# Patient Record
Sex: Female | Born: 1965 | Race: White | Hispanic: No | Marital: Married | Smoking: Never smoker
Health system: Southern US, Community
[De-identification: ages and names within clinical notes are randomized; demographics above are authoritative.]

## PROBLEM LIST (undated history)

## (undated) DIAGNOSIS — Z800 Family history of malignant neoplasm of digestive organs: Secondary | ICD-10-CM

## (undated) DIAGNOSIS — E119 Type 2 diabetes mellitus without complications: Secondary | ICD-10-CM

## (undated) DIAGNOSIS — Z803 Family history of malignant neoplasm of breast: Secondary | ICD-10-CM

## (undated) DIAGNOSIS — Z8042 Family history of malignant neoplasm of prostate: Secondary | ICD-10-CM

## (undated) DIAGNOSIS — C50919 Malignant neoplasm of unspecified site of unspecified female breast: Secondary | ICD-10-CM

## (undated) DIAGNOSIS — Z923 Personal history of irradiation: Secondary | ICD-10-CM

## (undated) HISTORY — DX: Family history of malignant neoplasm of prostate: Z80.42

## (undated) HISTORY — DX: Family history of malignant neoplasm of breast: Z80.3

## (undated) HISTORY — PX: CHOLECYSTECTOMY: SHX55

## (undated) HISTORY — PX: BREAST LUMPECTOMY: SHX2

## (undated) HISTORY — DX: Family history of malignant neoplasm of digestive organs: Z80.0

## (undated) HISTORY — PX: COLONOSCOPY: SHX174

---

## 1999-02-10 ENCOUNTER — Other Ambulatory Visit: Admission: RE | Admit: 1999-02-10 | Discharge: 1999-02-10 | Payer: Self-pay | Admitting: Obstetrics & Gynecology

## 2000-02-15 ENCOUNTER — Other Ambulatory Visit: Admission: RE | Admit: 2000-02-15 | Discharge: 2000-02-15 | Payer: Self-pay | Admitting: Obstetrics & Gynecology

## 2001-02-28 ENCOUNTER — Other Ambulatory Visit: Admission: RE | Admit: 2001-02-28 | Discharge: 2001-02-28 | Payer: Self-pay | Admitting: Obstetrics & Gynecology

## 2002-03-10 ENCOUNTER — Other Ambulatory Visit: Admission: RE | Admit: 2002-03-10 | Discharge: 2002-03-10 | Payer: Self-pay | Admitting: Obstetrics & Gynecology

## 2003-03-17 ENCOUNTER — Other Ambulatory Visit: Admission: RE | Admit: 2003-03-17 | Discharge: 2003-03-17 | Payer: Self-pay | Admitting: Obstetrics & Gynecology

## 2004-04-13 ENCOUNTER — Other Ambulatory Visit: Admission: RE | Admit: 2004-04-13 | Discharge: 2004-04-13 | Payer: Self-pay | Admitting: Obstetrics & Gynecology

## 2005-04-17 ENCOUNTER — Other Ambulatory Visit: Admission: RE | Admit: 2005-04-17 | Discharge: 2005-04-17 | Payer: Self-pay | Admitting: Obstetrics & Gynecology

## 2013-05-12 ENCOUNTER — Other Ambulatory Visit: Payer: Self-pay | Admitting: Otolaryngology

## 2013-05-12 DIAGNOSIS — M542 Cervicalgia: Secondary | ICD-10-CM

## 2013-05-14 ENCOUNTER — Ambulatory Visit
Admission: RE | Admit: 2013-05-14 | Discharge: 2013-05-14 | Disposition: A | Discharge status: Home / Self Care | Payer: 59 | Source: Ambulatory Visit | Attending: Otolaryngology | Admitting: Otolaryngology

## 2013-05-14 DIAGNOSIS — M542 Cervicalgia: Secondary | ICD-10-CM

## 2013-05-14 IMAGING — CT CT NECK W/ CM
4 of 5 series · 15 of 33 positions shown, 18 images · IV contrast (75CC OMNI 300)
Comparison: None.

CLINICAL DATA: 47-year-old female with left posterior neck pain x2
months. Area of clinical concern was marked, but there is no
associated palpable abnormality. Initial encounter.

EXAM:
CT NECK WITH CONTRAST
TECHNIQUE: Multidetector CT imaging of the neck was performed using the
standard protocol following the bolus administration of intravenous
contrast.
CONTRAST:  75mL OMNIPAQUE IOHEXOL 300 MG/ML  SOLN

[Series 2: axial neck · axial · 0.40mm/px · z∈[-256,-201]mm · 2 of 110 slices shown]
[im 22/110  bone]
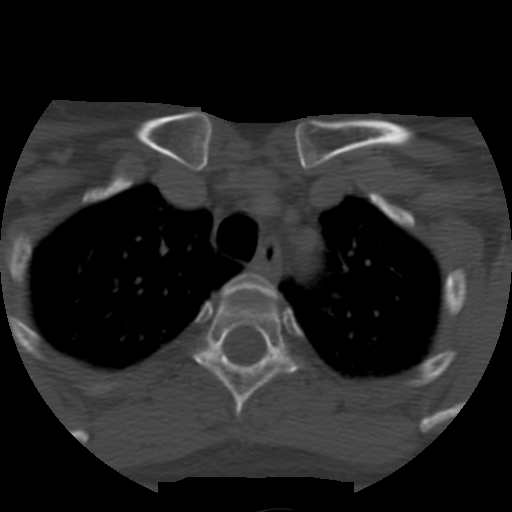
[im 44/110  bone]
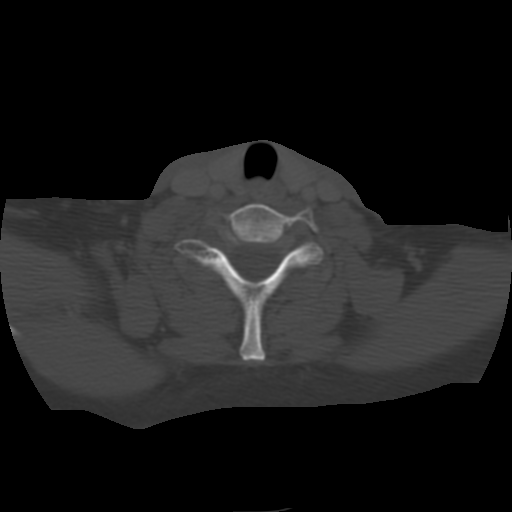

[Series 400: cor · coronal · 0.55mm/px · 3 of 96 slices shown]
[im 20/96  bone]
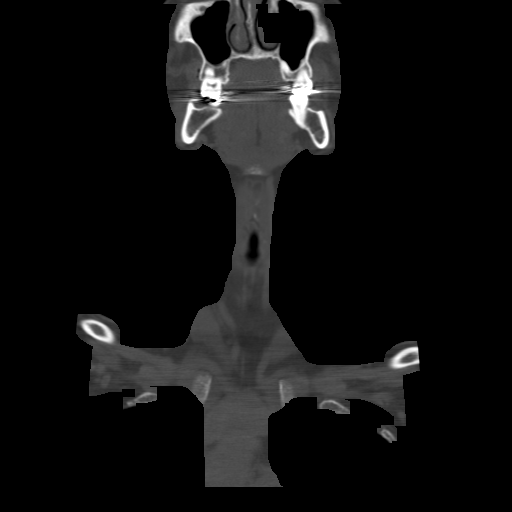
[im 39/96  bone]
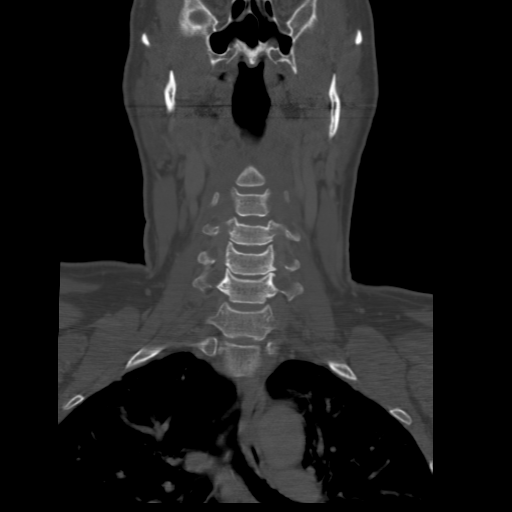
[im 58/96  bone]
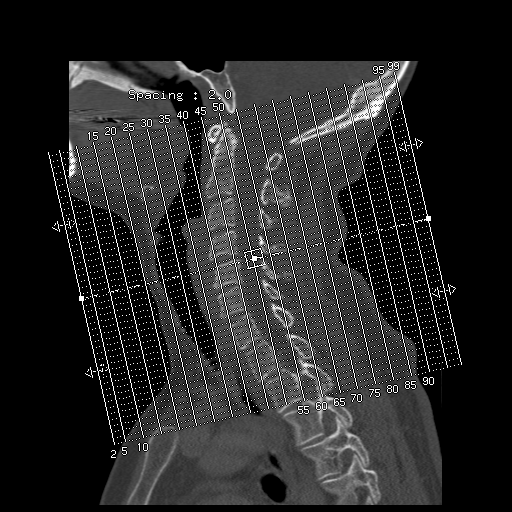

[Series 401: sag · sagittal · 0.55mm/px · 5 of 99 slices shown, 6 images]
[im 33/99  bone]
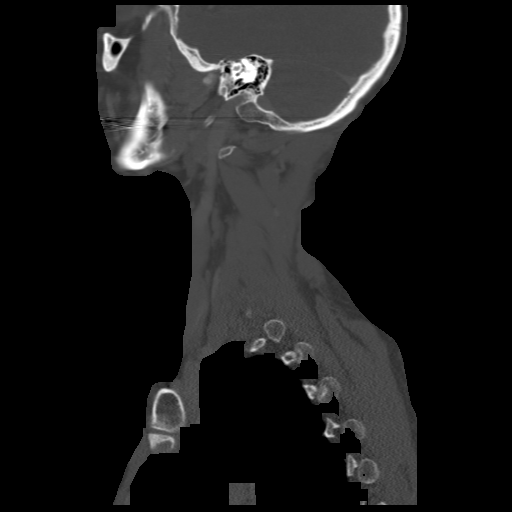
[im 41/99  bone]
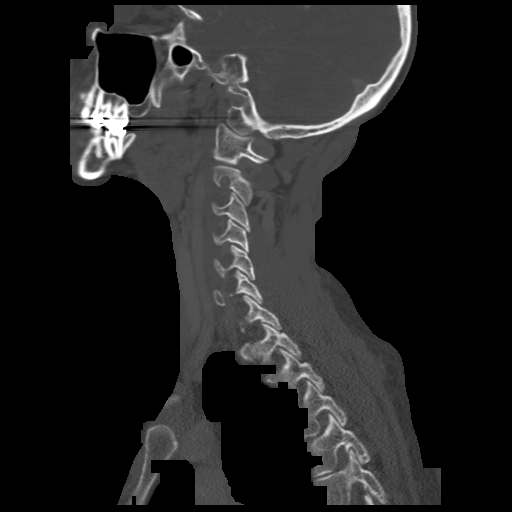
[im 50/99  soft-tissue]
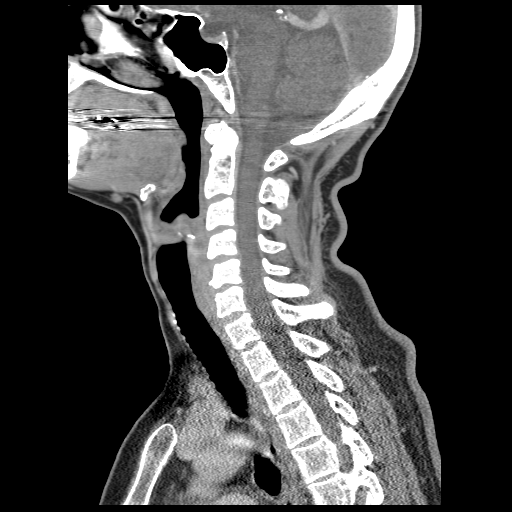
[im 50/99  bone]
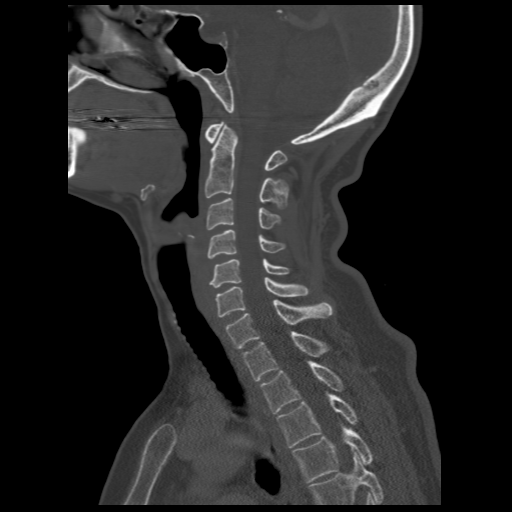
[im 58/99  bone]
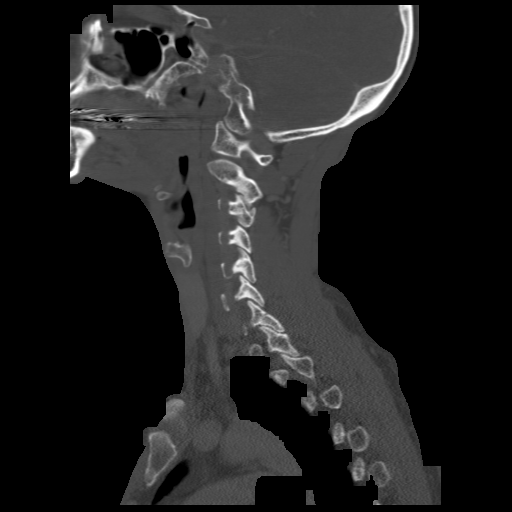
[im 66/99  bone]
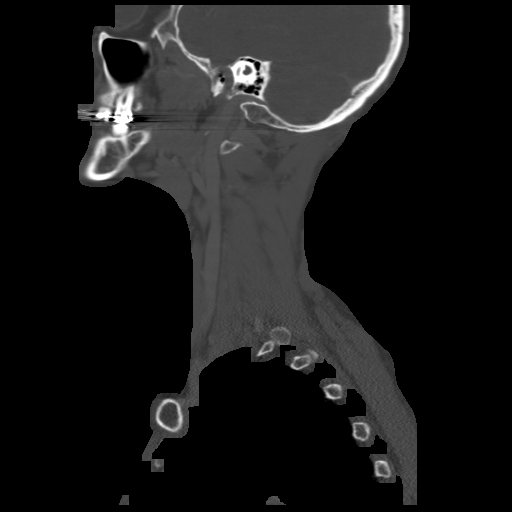

[Series 402: axial · axial · 0.43mm/px · z∈[-288,-108]mm · 5 of 140 slices shown, 7 images]
[im 24/140  soft-tissue]
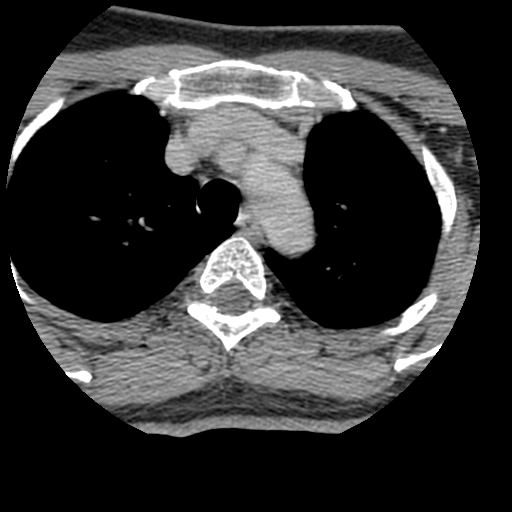
[im 24/140  bone]
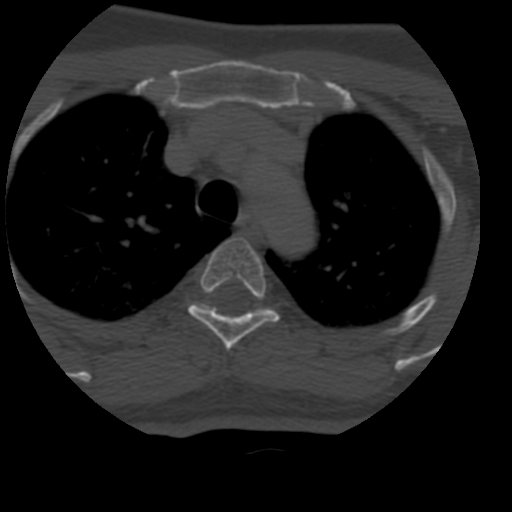
[im 47/140  bone]
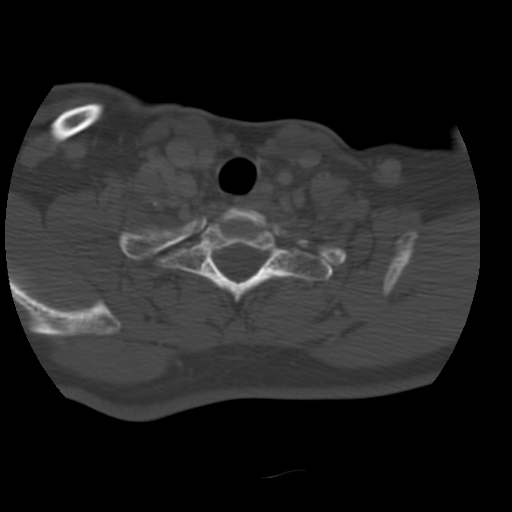
[im 70/140  bone]
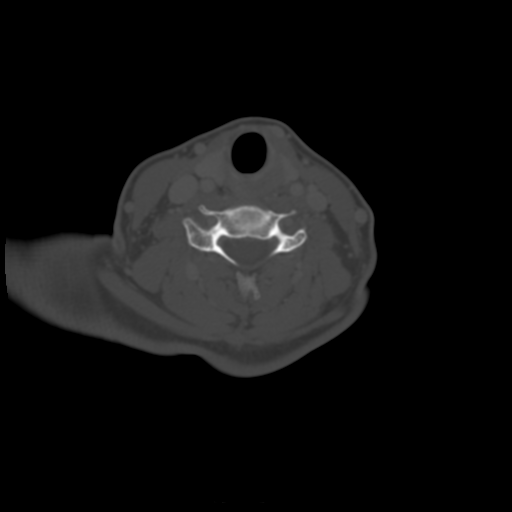
[im 93/140  bone]
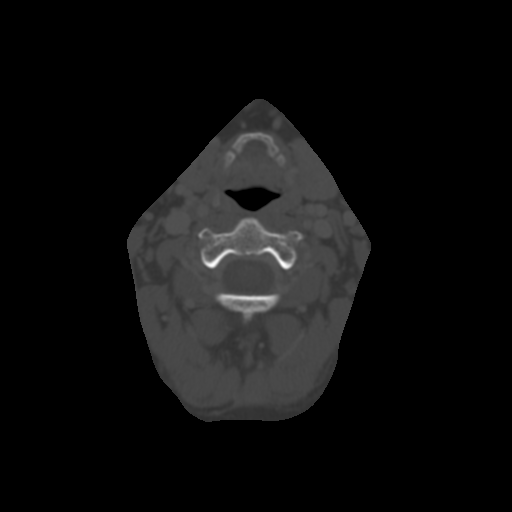
[im 116/140  soft-tissue]
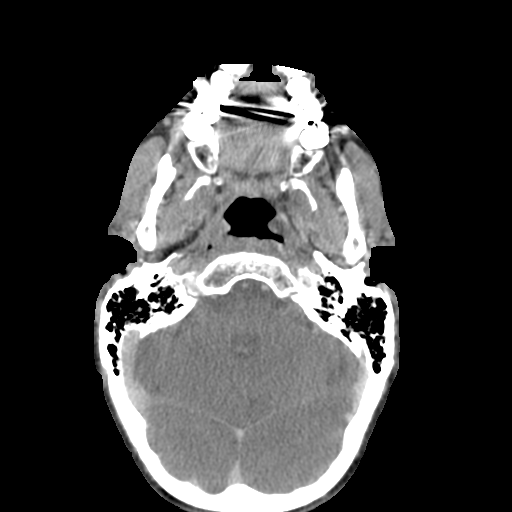
[im 116/140  bone]
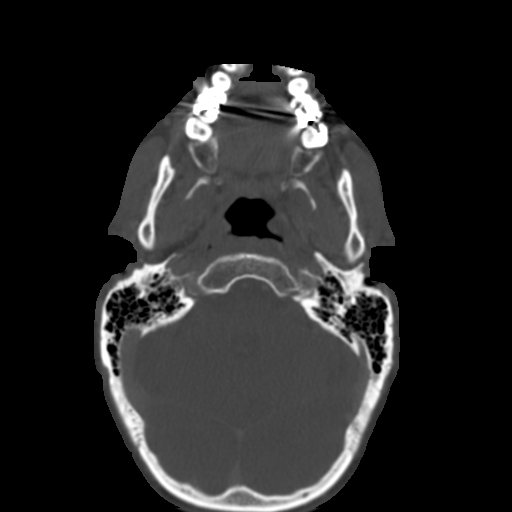

[15 of 33 positions shown; findings below may reference images not displayed]

FINDINGS: Negative lung apices. Negative superior mediastinum. No osseous
abnormality in the visible thorax.

Negative thyroid, larynx, pharynx, parapharyngeal spaces,
retropharyngeal space, sublingual space, submandibular glands, and
parotid glands. Visualized orbit soft tissues are within normal
limits.

Major vascular structures in the neck and at the skullbase are
patent. The right IJ is dominant. Intracranial E there is dominant
venous drainage to the right transverse sinus and sigmoid sinus.

No cervical lymphadenopathy.

No acute osseous abnormality identified. Overall the osseous
structures appear normal for age. Visualized paranasal sinuses and
mastoids are clear.

The painful area of concern is marked in the left suboccipital soft
tissues (Series 2, image 32, sagittal image 79 overlying the left
sternocleidomastoid muscle just below its insertion. The left
mastoid air cells appear normal. No inflammatory process, muscle
abnormality, or soft tissue mass identified. No degenerative osseous
changes are identified at the skullbase. There is nearby a moderate
to severe unilateral left side C2-C3 facet degeneration with
degenerative spurring and subchondral cysts. Other cervical facets
appear normal.
IMPRESSION: 1. Painful area of concern localized to the left suboccipital soft
tissues. The only regional abnormality identified is advanced left
C2-C3 facet arthropathy, which is superimposed on an otherwise
normal for age appearing cervical spine.

2. Otherwise normal neck CT.

## 2013-05-14 MED ORDER — IOHEXOL 300 MG/ML  SOLN
75.0000 mL | Freq: Once | INTRAMUSCULAR | Status: AC | PRN
  Administered 2013-05-14: 75 mL via INTRAVENOUS

## 2015-09-07 ENCOUNTER — Other Ambulatory Visit: Payer: Self-pay

## 2015-09-07 DIAGNOSIS — F419 Anxiety disorder, unspecified: Secondary | ICD-10-CM | POA: Insufficient documentation

## 2015-09-07 MED ORDER — ESCITALOPRAM OXALATE 10 MG PO TABS: 10.0000 mg | ORAL_TABLET | Freq: Every day | ORAL | Status: AC

## 2015-09-07 NOTE — Telephone Encounter (Signed)
Called prescription in. Left message for pt to schedule OV. Renaldo Fiddler, CMA

## 2015-09-07 NOTE — Telephone Encounter (Signed)
Pt had well woman check at OB-GYN. LOV 08/27/2014. Need to call rx into pharmacy at [phone] option # 2 due to pt's insurance. Renaldo Fiddler, CMA

## 2017-01-05 LAB — HEMOGLOBIN A1C: Hemoglobin A1C: 7.4

## 2017-07-05 ENCOUNTER — Ambulatory Visit
Admission: RE | Admit: 2017-07-05 | Discharge: 2017-07-05 | Disposition: A | Discharge status: Home / Self Care | Payer: Managed Care, Other (non HMO) | Source: Ambulatory Visit | Attending: Family Medicine | Admitting: Family Medicine

## 2017-07-05 ENCOUNTER — Encounter: Payer: Self-pay | Admitting: Family Medicine

## 2017-07-05 ENCOUNTER — Ambulatory Visit (INDEPENDENT_AMBULATORY_CARE_PROVIDER_SITE_OTHER): Payer: Managed Care, Other (non HMO) | Admitting: Family Medicine

## 2017-07-05 VITALS — BP 116/68 | HR 81 | Temp 98.2°F | Resp 16 | Wt 158.0 lb

## 2017-07-05 DIAGNOSIS — R05 Cough: Secondary | ICD-10-CM

## 2017-07-05 DIAGNOSIS — R0789 Other chest pain: Secondary | ICD-10-CM

## 2017-07-05 DIAGNOSIS — R059 Cough, unspecified: Secondary | ICD-10-CM

## 2017-07-05 IMAGING — CR DG CHEST 2V
1 series · 2 of 2 positions shown · non-contrast
Comparison: None

CLINICAL DATA: Bronchitis, productive cough in [REDACTED], still has
dry cough, congestion and LEFT side chest wall pain, crackles at
LEFT base

EXAM:
CHEST  2 VIEW

[Series 1: dg chest 2 view · 0.14mm/px · 2 of 2 slices shown]
[im 1/2]
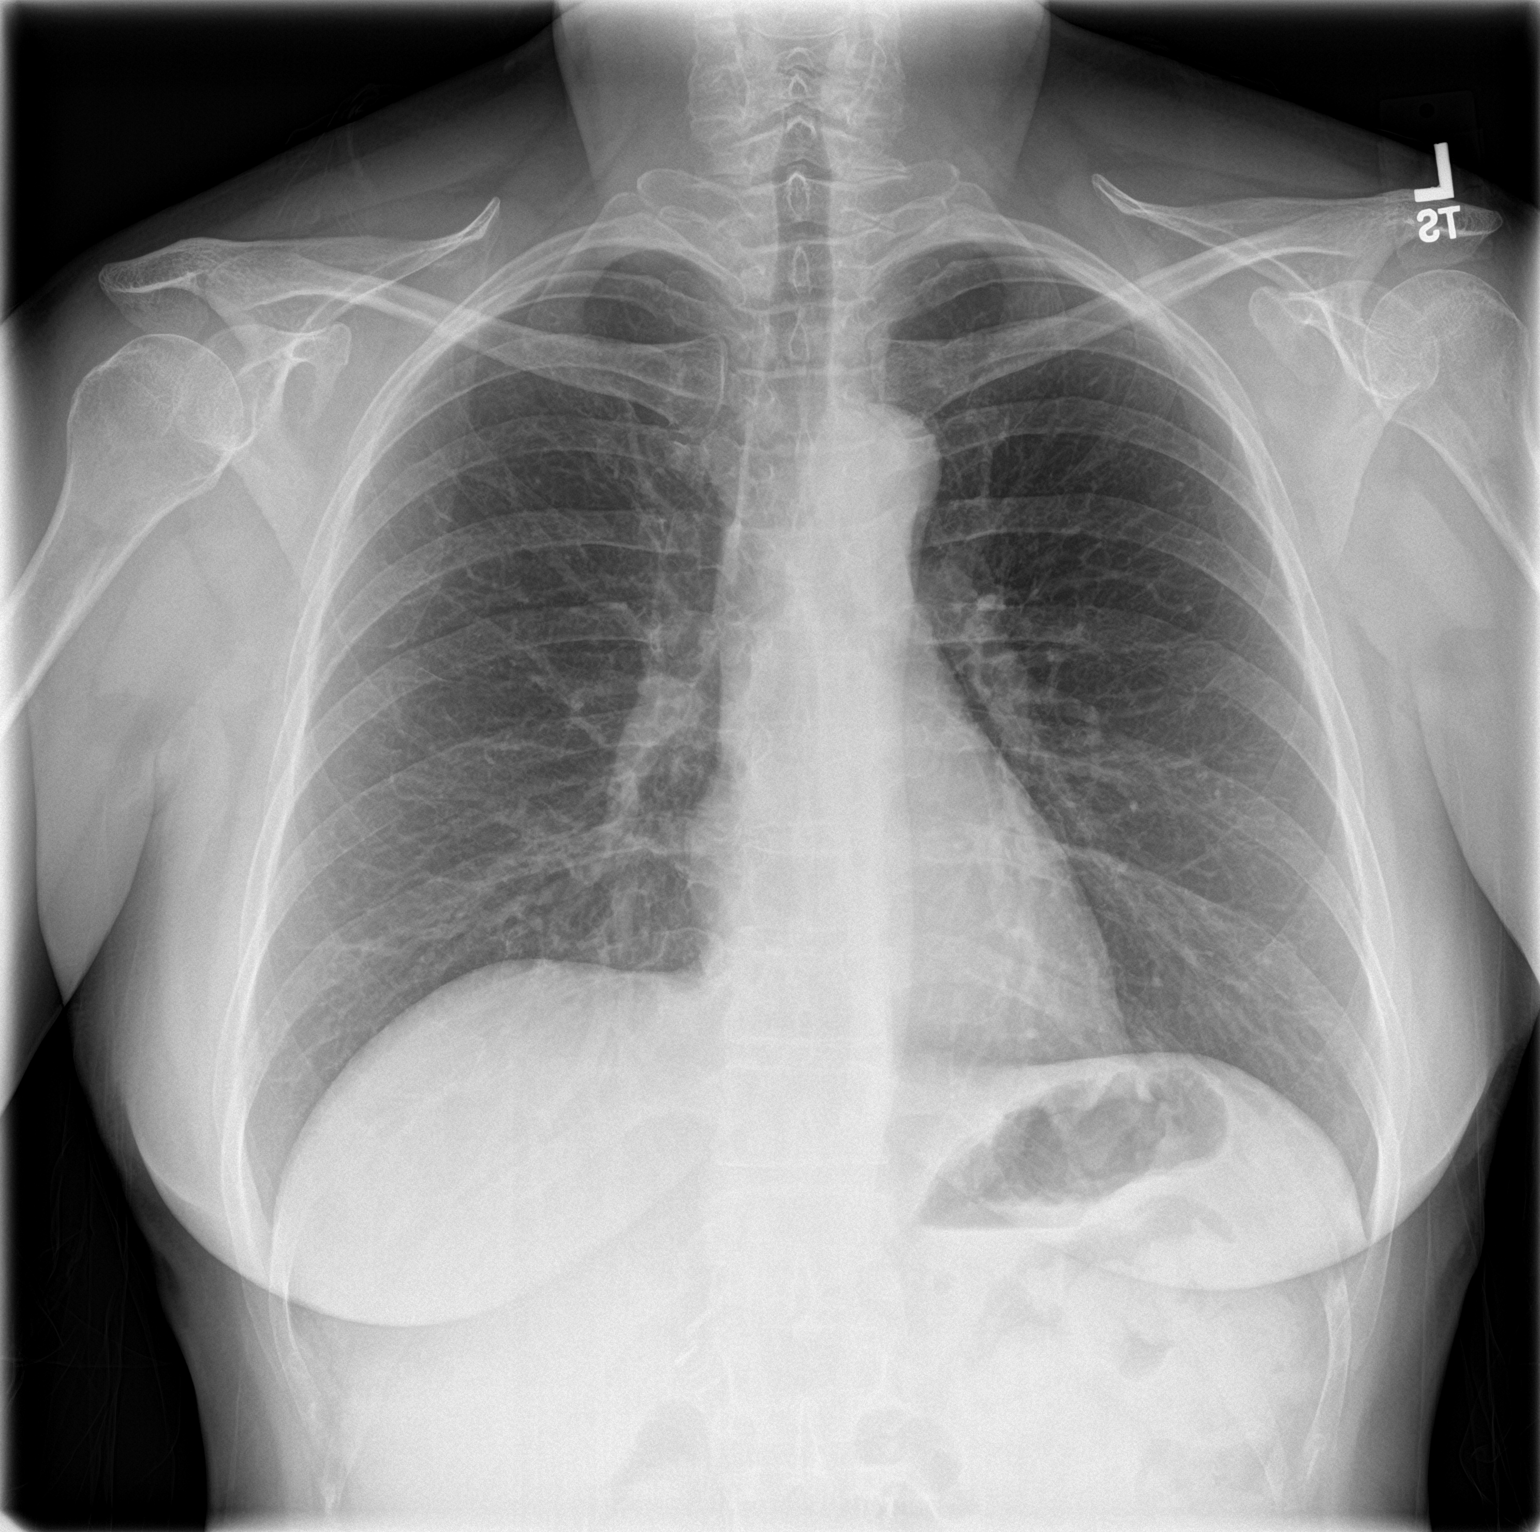
[im 2/2]
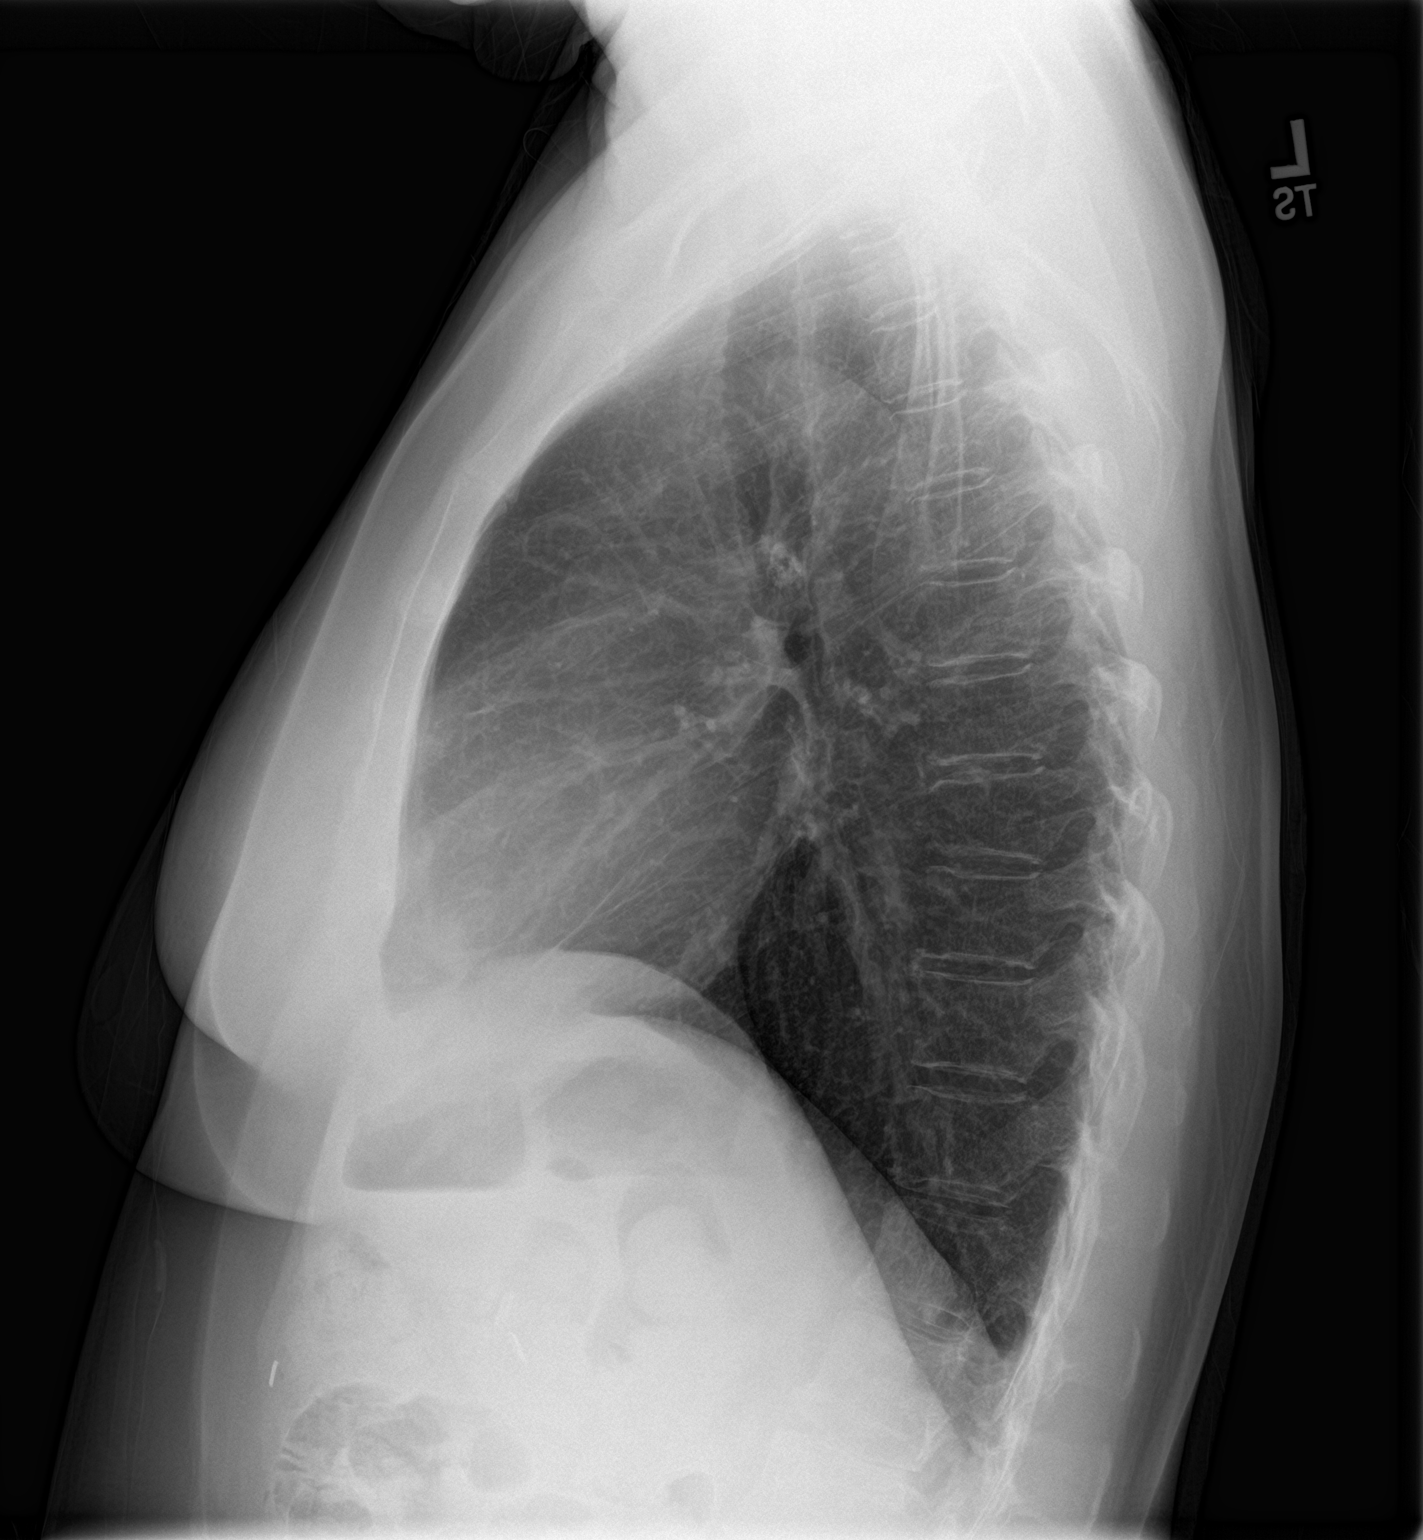

[2 of 2 positions shown; findings below may reference images not displayed]

FINDINGS: Normal heart size, mediastinal contours, and pulmonary vascularity.

Lungs clear.

No pleural effusion or pneumothorax.

Bones unremarkable.
IMPRESSION: Normal exam.

## 2017-07-05 MED ORDER — TRAMADOL HCL 50 MG PO TABS: 50.0000 mg | ORAL_TABLET | Freq: Three times a day (TID) | ORAL | 0 refills | Status: DC | PRN

## 2017-07-05 NOTE — Progress Notes (Signed)
Patient: Amy Stephenson Female    DOB: 19-Jul-1965   51 y.o.   MRN: [id number] Visit Date: 07/05/2017  Today's Provider: Lavon Paganini, MD   I, Martha Clan, CMA, am acting as scribe for Lavon Paganini, MD.  Chief Complaint  Patient presents with  . Cough   Subjective:    Cough  This is a new problem. The problem has been gradually worsening (x 3 months). The cough is productive of sputum. Associated symptoms include chest pain (rib), rhinorrhea and wheezing. Pertinent negatives include no chills, fever, nasal congestion or postnasal drip. Exacerbated by: pain in rib is aggravated by movement; coughing worse when lying down. Treatments tried: ice, heat for rib pain. The treatment provided no relief.  Pt believes she has pleurisy; she states she can feel fluid; "feels like a water balloon".   States she had bronchitis 3 months ago.  Had productive cough at that time but now just dry cough.  Allergies not on file   Current Outpatient Medications:  .  Dapagliflozin-Metformin HCl ER (XIGDUO XR) 03-999 MG TB24, Xigduo XR 10 mg-1,000 mg tablet,extended release, Disp: , Rfl:  .  escitalopram (LEXAPRO) 10 MG tablet, Take 1 tablet (10 mg total) by mouth daily. Needs ov. Thanks., Disp: 90 tablet, Rfl: 0 .  INTROVALE 0.15-0.03 MG tablet, , Disp: , Rfl:  .  MULTIPLE VITAMIN PO, Take 0.5 tablets by mouth. , Disp: , Rfl:  .  SitaGLIPtin-MetFORMIN HCl (JANUMET XR) [phone] MG TB24, Janumet XR 100 mg-1,000 mg tablet,extended release, Disp: , Rfl:  .  zolpidem (AMBIEN) 10 MG tablet, zolpidem 10 mg tablet, Disp: , Rfl:   Review of Systems  Constitutional: Negative for chills and fever.  HENT: Positive for rhinorrhea. Negative for postnasal drip.   Respiratory: Positive for cough and wheezing.   Cardiovascular: Positive for chest pain (rib).    Social History   Tobacco Use  . Smoking status: Never Smoker  . Smokeless tobacco: Never Used  Substance Use Topics  . Alcohol use: No     Frequency: Never   Objective:   BP 116/68 (BP Location: Left Arm, Patient Position: Sitting, Cuff Size: Normal)   Pulse 81   Temp 98.2 F (36.8 C) (Oral)   Resp 16   Wt 158 lb (71.7 kg)   SpO2 97%  Vitals:   07/05/17 1456  BP: 116/68  Pulse: 81  Resp: 16  Temp: 98.2 F (36.8 C)  TempSrc: Oral  SpO2: 97%  Weight: 158 lb (71.7 kg)     Physical Exam  Constitutional: She is oriented to person, place, and time. She appears well-developed and well-nourished. No distress.  HENT:  Head: Normocephalic and atraumatic.  Right Ear: Tympanic membrane, external ear and ear canal normal.  Left Ear: Tympanic membrane, external ear and ear canal normal.  Nose: Nose normal.  Mouth/Throat: Oropharynx is clear and moist and mucous membranes are normal.  Eyes: Conjunctivae are normal. No scleral icterus.  Neck: Neck supple. No thyromegaly present.  Cardiovascular: Normal rate, regular rhythm, normal heart sounds and intact distal pulses.  No murmur heard. Pulmonary/Chest: Effort normal. No respiratory distress. She has no wheezes. She has rales (in L base). She exhibits tenderness (along L lower chest wall).  Musculoskeletal: She exhibits no edema or deformity.  Lymphadenopathy:    She has no cervical adenopathy.  Neurological: She is alert and oriented to person, place, and time.  Skin: Skin is warm and dry. No rash noted.  Psychiatric: She has a normal mood  and affect. Her behavior is normal.        Assessment & Plan:     1. Cough - discussed there are many reasons for chronic cough including asthma, GERD, allergies, meds - not on ACEi - can hear crackles in L base, will get CXR to eval - consider spironmetry in future if continues - DG Chest 2 View; Future  2. Chest wall pain - TTP that reporduces chest pain - suspect muscular strain of intercostal muscles - DG Chest 2 View; Future  Return in about 3 months (around 10/03/2017) for physical.     The entirety of the  information documented in the History of Present Illness, Review of Systems and Physical Exam were personally obtained by me. Portions of this information were initially documented by Raquel Sarna Ratchford, CMA and reviewed by me for thoroughness and accuracy.    Virginia Crews, MD, MPH [facility] 07/06/2017 5:06 PM

## 2017-07-05 NOTE — Patient Instructions (Signed)
Chest Wall Pain °Chest wall pain is pain in or around the bones and muscles of your chest. Sometimes, an injury causes this pain. Sometimes, the cause may not be known. This pain may take several weeks or longer to get better. °Follow these instructions at home: °Pay attention to any changes in your symptoms. Take these actions to help with your pain: °· Rest as told by your health care provider. °· Avoid activities that cause pain. These include any activities that use your chest muscles or your abdominal and side muscles to lift heavy items. °· If directed, apply ice to the painful area: °? Put ice in a plastic bag. °? Place a towel between your skin and the bag. °? Leave the ice on for 20 minutes, 2-3 times per day. °· Take over-the-counter and prescription medicines only as told by your health care provider. °· Do not use tobacco products, including cigarettes, chewing tobacco, and e-cigarettes. If you need help quitting, ask your health care provider. °· Keep all follow-up visits as told by your health care provider. This is important. ° °Contact a health care provider if: °· You have a fever. °· Your chest pain becomes worse. °· You have new symptoms. °Get help right away if: °· You have nausea or vomiting. °· You feel sweaty or light-headed. °· You have a cough with phlegm (sputum) or you cough up blood. °· You develop shortness of breath. °This information is not intended to replace advice given to you by your health care provider. Make sure you discuss any questions you have with your health care provider. °Document Released: 06/12/2005 Document Revised: 10/21/2015 Document Reviewed: 09/07/2014 °Elsevier Interactive Patient Education © 2018 [employer] ° °

## 2017-07-06 ENCOUNTER — Telehealth: Payer: Self-pay | Admitting: Family Medicine

## 2017-07-06 NOTE — Telephone Encounter (Signed)
Pt is requesting a call back for her Xray results from 07/05/17. Please advise. Thanks TNP

## 2017-07-24 ENCOUNTER — Encounter: Payer: Self-pay | Admitting: Family Medicine

## 2017-07-24 ENCOUNTER — Telehealth: Payer: Self-pay

## 2017-07-24 NOTE — Telephone Encounter (Signed)
Pt was seen on 07/05/2017 for cough. She believed she had pleurisy at the time. CXR was negative, and pt was not prescribed any mediations to improve her sx. She called stating her sx are worse, and she is convinced she has pneumonia and pleurisy. Her pharmacist advised her that 60% of pneumonia is not detected by CXR, and believes she should be treated for pneumonia because of this fact. She played a video over the phone of her wheezing. She is fatigued and still coughing. She is asking for an antibiotic to be sent to [place]. She does not feel she needs to come in to office because her sx are "exactly the same" as they were at [place]. Pt advised Dr. B is out of the office for the afternoon, and states this can wait until tomorrow. Please advise.

## 2017-07-25 NOTE — Telephone Encounter (Signed)
Pt advised.  Apt made for 2:45 tomorrow.   Thanks,   -Mickel Baas

## 2017-07-25 NOTE — Telephone Encounter (Signed)
I have seen video and read notes from patient.  CXR is adequate to evaluate for pulmonary edema (fluid in the lungs) and pneumonia.  A viral cough and chest wall pain can linger for weeks.  Pleurisy occurs with pneumonia or other lung conditions that cause pain around the lung.  This is also treated with anti-inflammatories, just like chest wall pain.  The video sounds like upper airway sounds, not wheezing.  If patient wishes to discuss antibiotics, she will need to be re-evaluated.  Please schedule appt at her convenience.  I believe there are some open appts in clinic this afternoon, but unfortunately, I do not have any until tomorrow.  Virginia Crews, MD, MPH [facility] 07/25/2017 11:23 AM

## 2017-07-26 ENCOUNTER — Ambulatory Visit: Payer: Managed Care, Other (non HMO) | Admitting: Family Medicine

## 2017-08-28 ENCOUNTER — Telehealth: Payer: Self-pay

## 2017-08-28 NOTE — Telephone Encounter (Signed)
Received letter from [facility] stating pt is due for A1C. Last A1C was 01/05/2017 through endocrinology, and was 7.4%. This was abstracted. Pt also was due for FU with endo in October. Left message asking pt to schedule FU with endo.

## 2017-10-03 ENCOUNTER — Encounter: Payer: Managed Care, Other (non HMO) | Admitting: Family Medicine

## 2018-12-13 ENCOUNTER — Other Ambulatory Visit: Payer: Self-pay | Admitting: Internal Medicine

## 2018-12-13 DIAGNOSIS — R1011 Right upper quadrant pain: Secondary | ICD-10-CM

## 2018-12-17 ENCOUNTER — Ambulatory Visit: Payer: Managed Care, Other (non HMO)

## 2018-12-24 ENCOUNTER — Ambulatory Visit: Payer: BC Managed Care – PPO

## 2020-08-26 ENCOUNTER — Other Ambulatory Visit: Payer: Self-pay | Admitting: Obstetrics & Gynecology

## 2020-08-26 DIAGNOSIS — R928 Other abnormal and inconclusive findings on diagnostic imaging of breast: Secondary | ICD-10-CM

## 2020-09-09 ENCOUNTER — Ambulatory Visit
Admission: RE | Admit: 2020-09-09 | Discharge: 2020-09-09 | Disposition: A | Discharge status: Home / Self Care | Payer: BC Managed Care – PPO | Source: Ambulatory Visit | Attending: Obstetrics & Gynecology | Admitting: Obstetrics & Gynecology

## 2020-09-09 ENCOUNTER — Other Ambulatory Visit: Payer: Self-pay | Admitting: Obstetrics & Gynecology

## 2020-09-09 ENCOUNTER — Other Ambulatory Visit: Payer: Self-pay

## 2020-09-09 DIAGNOSIS — R928 Other abnormal and inconclusive findings on diagnostic imaging of breast: Secondary | ICD-10-CM

## 2020-09-09 IMAGING — US US BREAST*L* LIMITED INC AXILLA
1 series · 11 of 11 positions shown · non-contrast
Comparison: Previous exam(s).

CLINICAL DATA: Screening recall for a possible left breast mass.
The patient has family history of breast cancer in her mother and in
her mother's half sister.

EXAM:
DIGITAL DIAGNOSTIC UNILATERAL LEFT MAMMOGRAM WITH TOMOSYNTHESIS AND
CAD; ULTRASOUND LEFT BREAST LIMITED
TECHNIQUE: Left digital diagnostic mammography and breast tomosynthesis was
performed. The images were evaluated with computer-aided detection.;
Targeted ultrasound examination of the left breast was performed

[Series 1: us breast*left* limited inc axilla · 0.06mm/px · 11 of 11 slices shown]
[im 1/11]
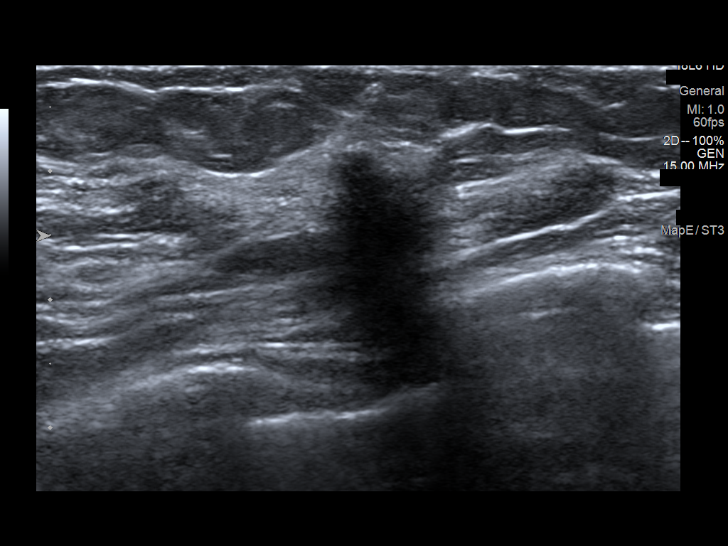
[im 2/11]
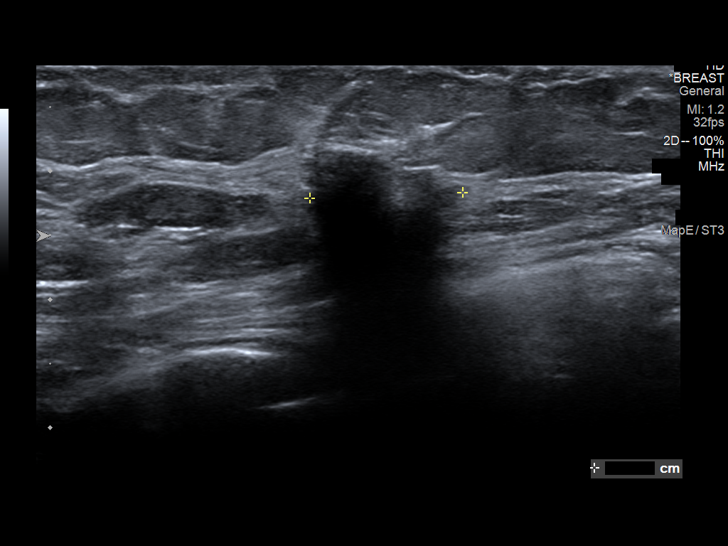
[im 3/11]
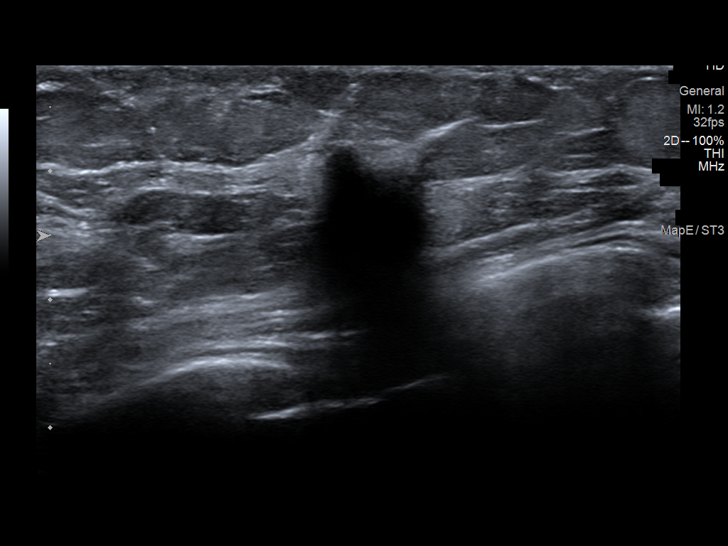
[im 4/11]
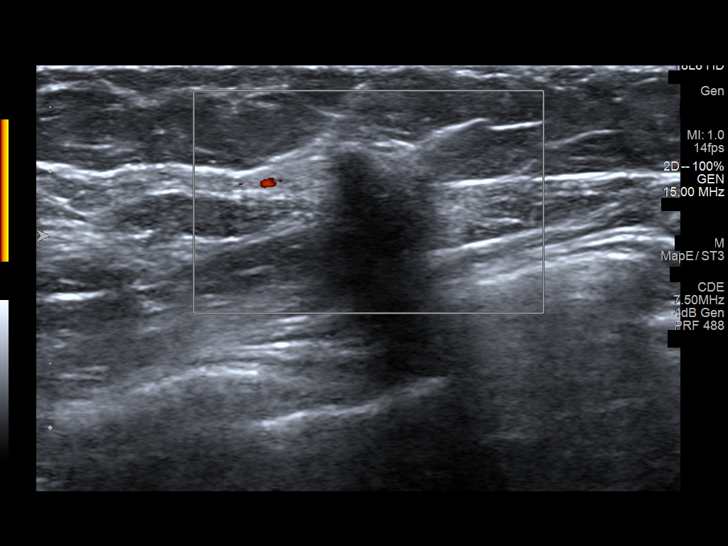
[im 5/11]
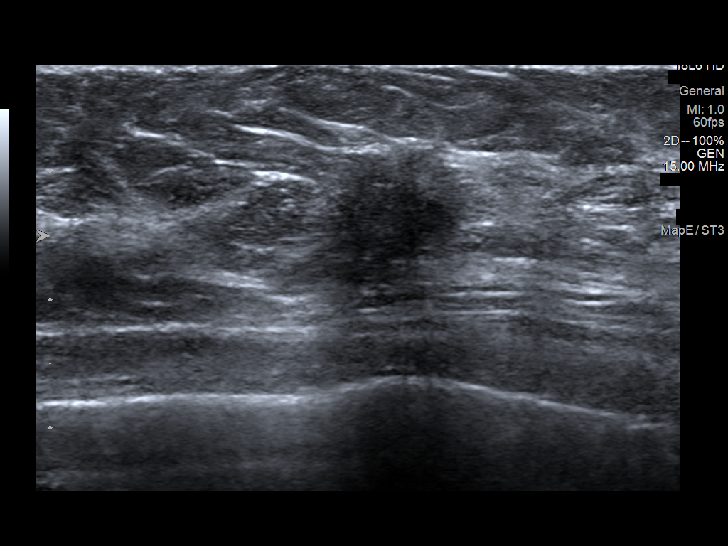
[im 6/11]
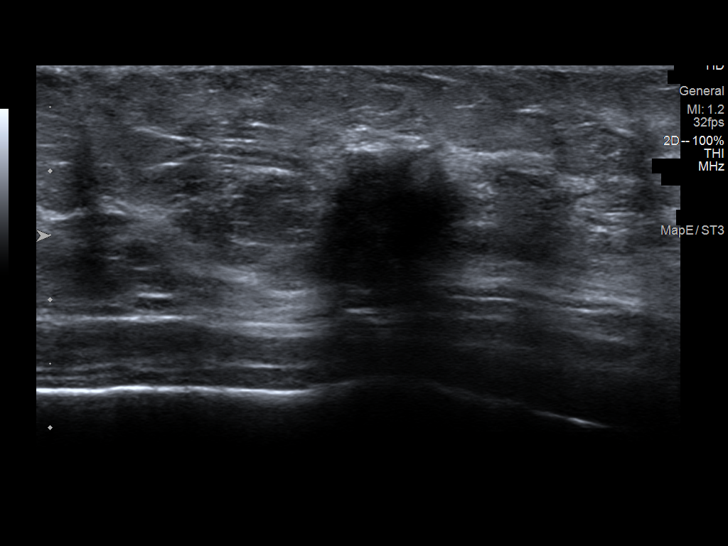
[im 7/11]
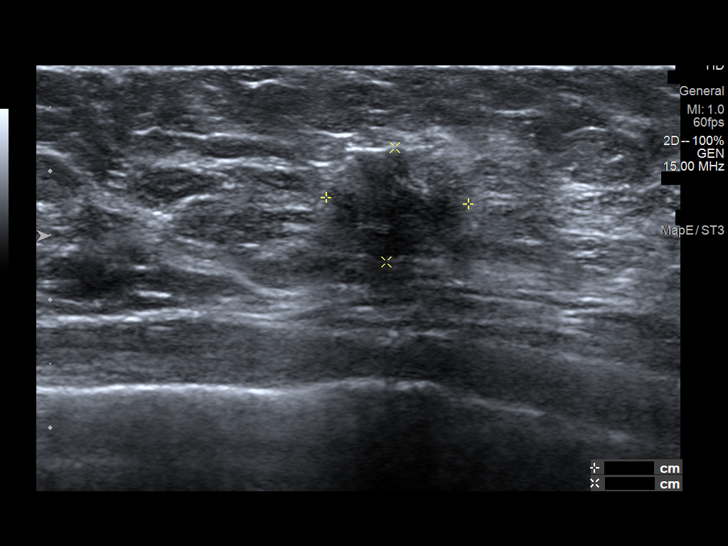
[im 8/11]
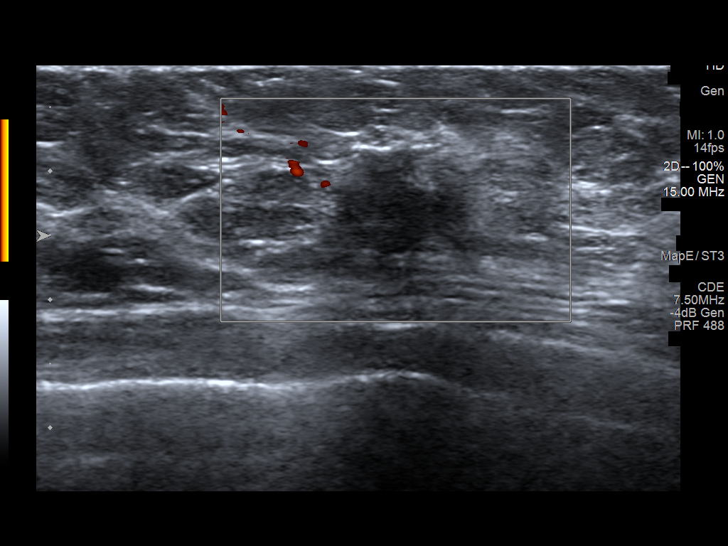
[im 9/11]
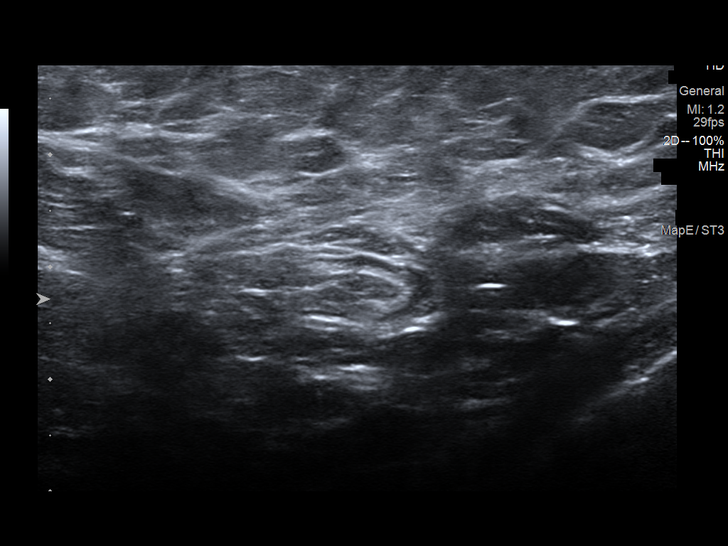
[im 10/11]
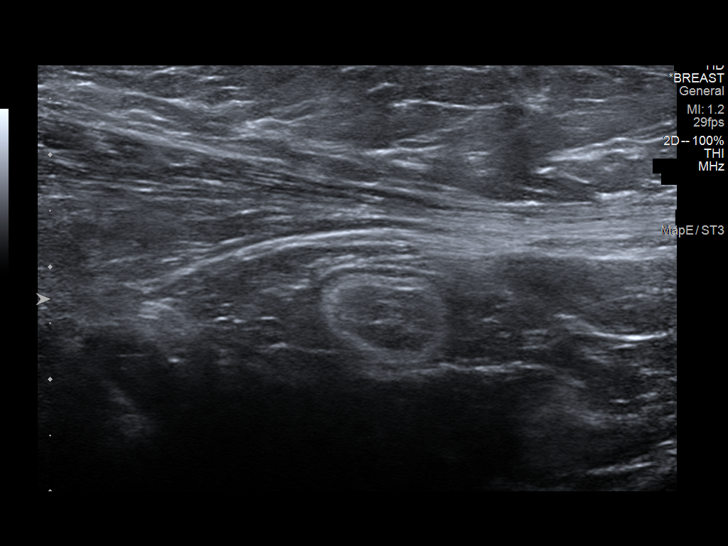
[im 11/11]
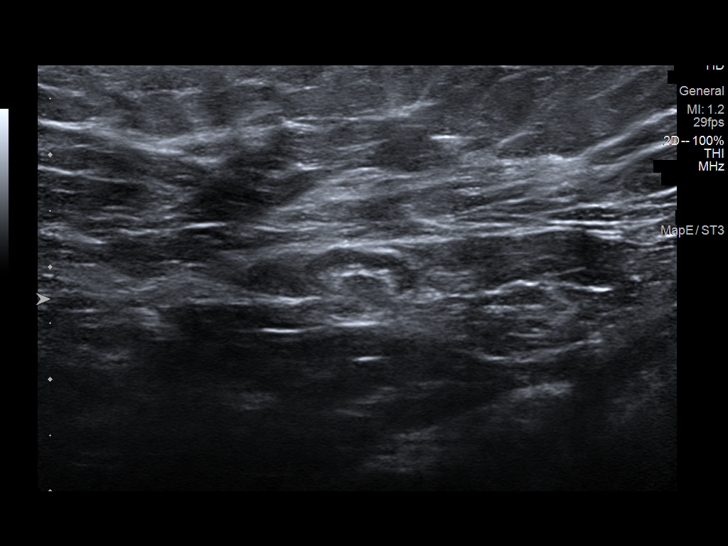

[11 of 11 positions shown; findings below may reference images not displayed]

ACR Breast Density Category c: The breast tissue is heterogeneously
dense, which may obscure small masses.
FINDINGS: Spot compression tomosynthesis images demonstrate a persistent
irregular spiculated mass in the slightly lateral far posterior left
breast. The mass measures approximately 1.1 cm. There are about 3
tiny calcifications within the mass. The full posterior margin of
the mass cannot be visualized on the mammogram due to the far
posterior depth.

There is a firm palpable mass in the lower outer far posterior left
breast at the 5 o'clock position.

Ultrasound targeted to the left breast at 5 o'clock, 4 cm from the
nipple demonstrates an irregular shadowing mass measuring 1.2 x
x 1.1 cm. The mass at least abuts the chest wall on the ultrasound
images. Ultrasound of the left axilla demonstrates multiple
normal-appearing lymph nodes.
IMPRESSION: 1. There is a highly suspicious 1.2 cm mass in the left breast at 5
o'clock. The mass appears to abuts the chest wall on the ultrasound
images, and the full posterior margin is not visualized on the
mammogram images due to its far posterior location.

2.  No evidence of left axillary lymphadenopathy.

RECOMMENDATION:
1. Ultrasound-guided biopsy is recommended for the left breast mass.
This has been scheduled for [DATE] at [DATE] a.m.

2. Due to the patient's family history, breast tissue density and
proximity to the posterior chest wall, MRI is recommended following
ultrasound-guided biopsy.

I have discussed the findings and recommendations with the patient.
If applicable, a reminder letter will be sent to the patient
regarding the next appointment.

BI-RADS CATEGORY  5: Highly suggestive of malignancy.

## 2020-09-09 IMAGING — MG MM DIGITAL DIAGNOSTIC UNILAT*L* W/ TOMO W/ CAD
6 of 12 series · 6 of 36 positions shown · non-contrast
Comparison: Previous exam(s).

CLINICAL DATA: Screening recall for a possible left breast mass.
The patient has family history of breast cancer in her mother and in
her mother's half sister.

EXAM:
DIGITAL DIAGNOSTIC UNILATERAL LEFT MAMMOGRAM WITH TOMOSYNTHESIS AND
CAD; ULTRASOUND LEFT BREAST LIMITED
TECHNIQUE: Left digital diagnostic mammography and breast tomosynthesis was
performed. The images were evaluated with computer-aided detection.;
Targeted ultrasound examination of the left breast was performed

[L MLO synth-2D (1 of 3)]
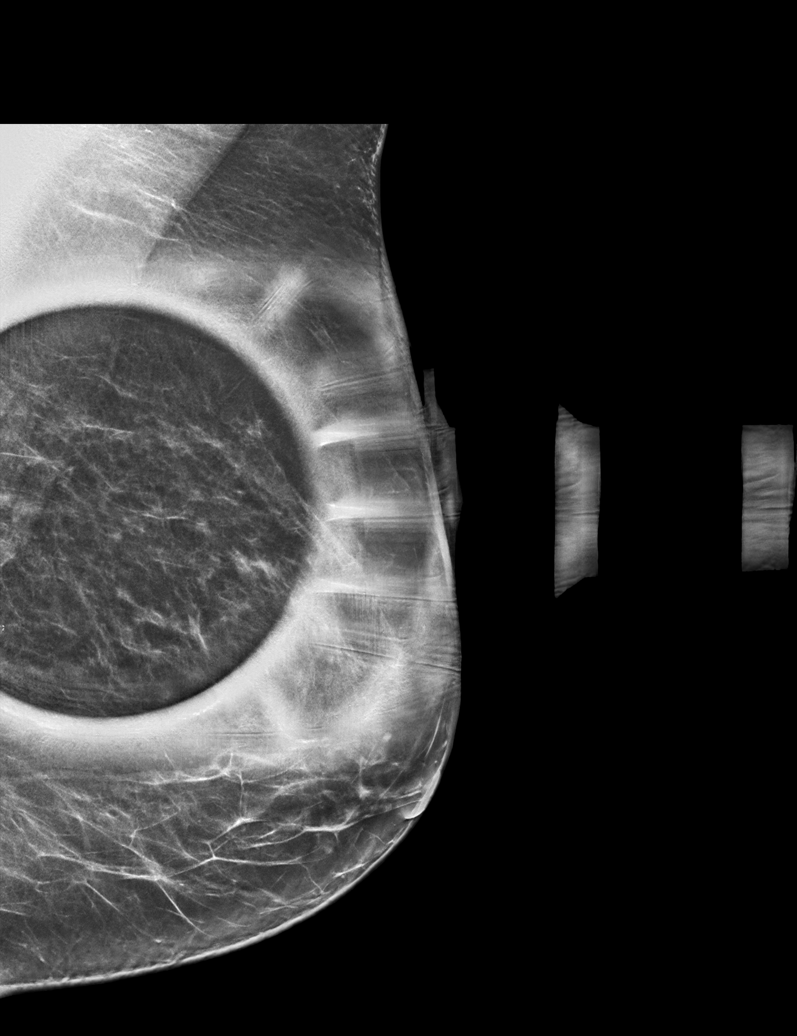

[L MLO synth-2D (2 of 3)]
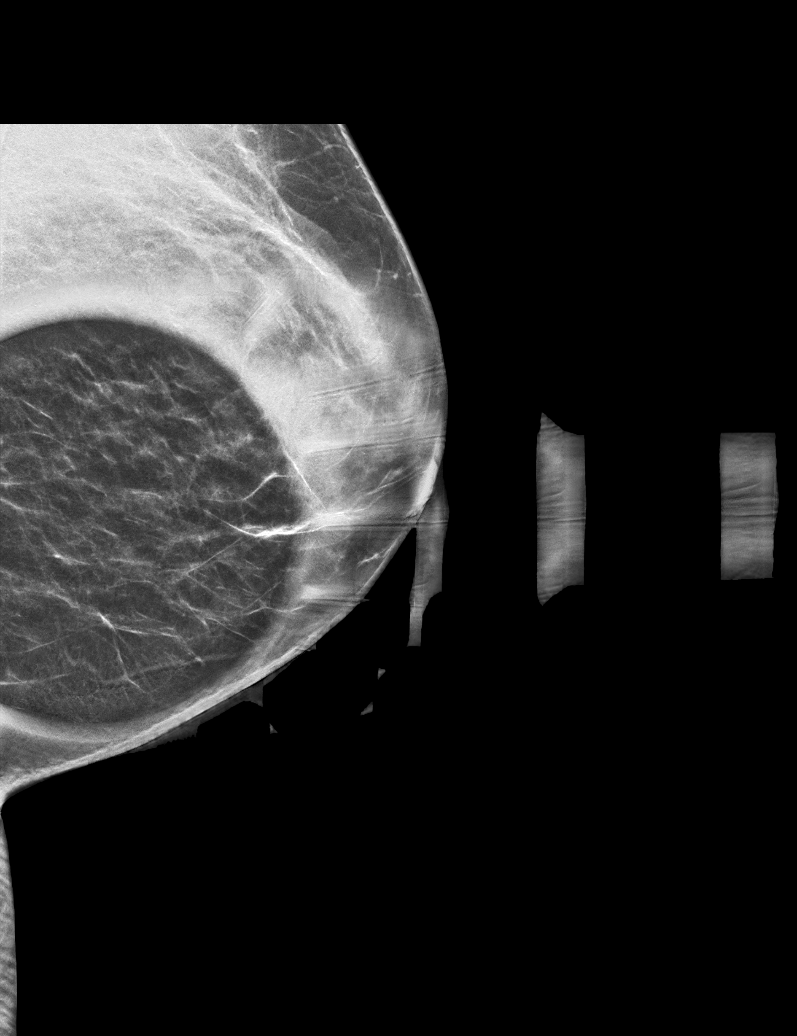

[L ML synth-2D (1 of 2)]
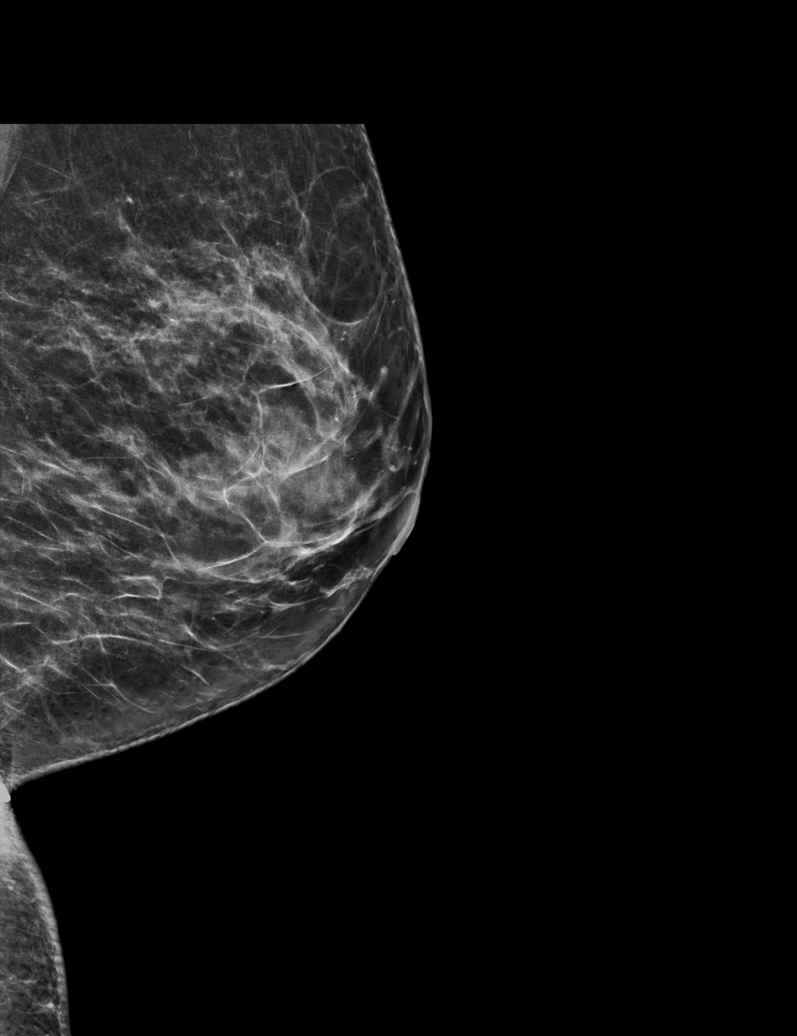

[L MLO synth-2D (3 of 3)]
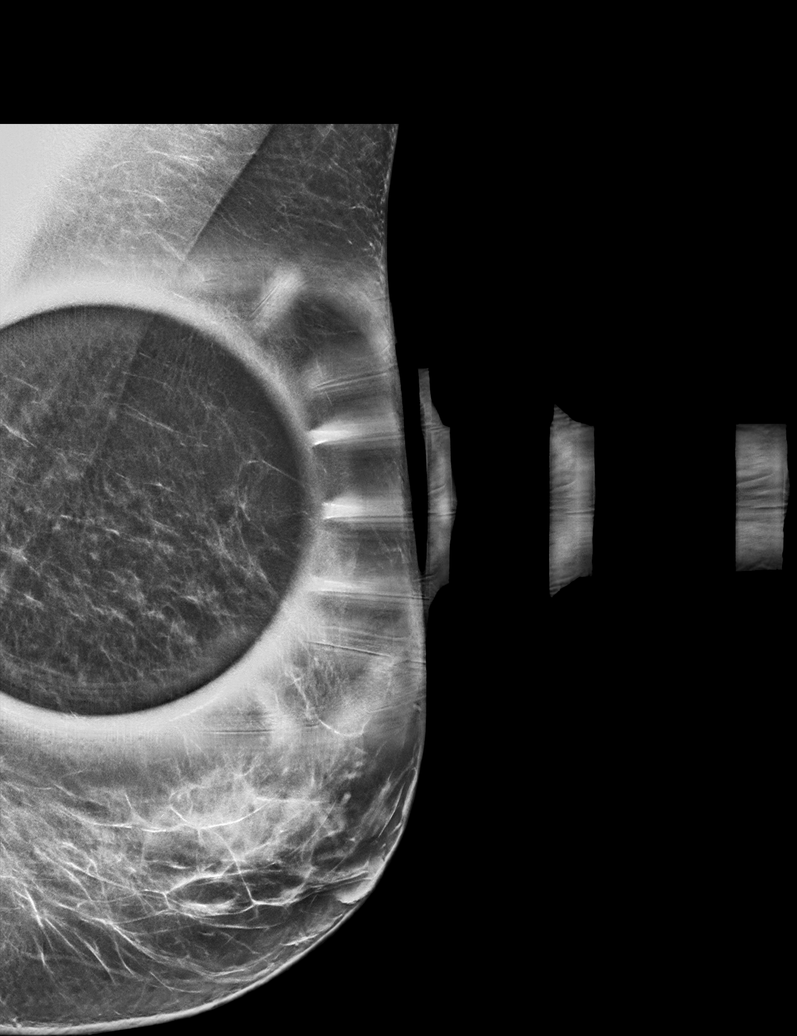

[L CC synth-2D]
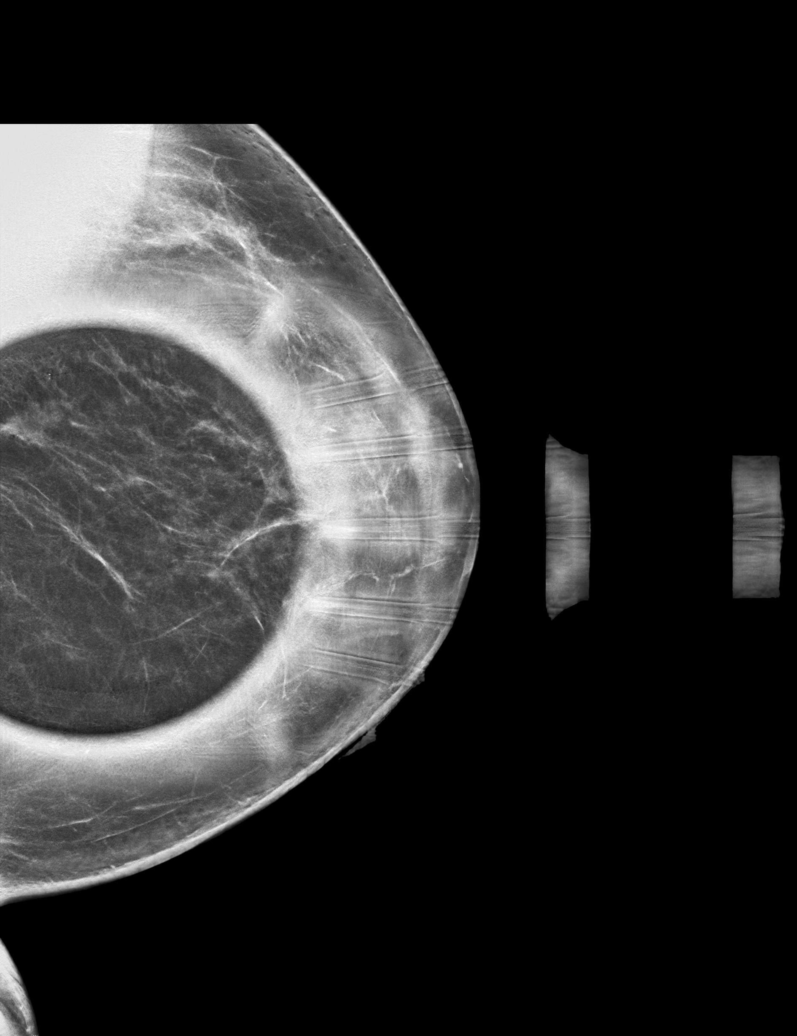

[L ML synth-2D (2 of 2)]
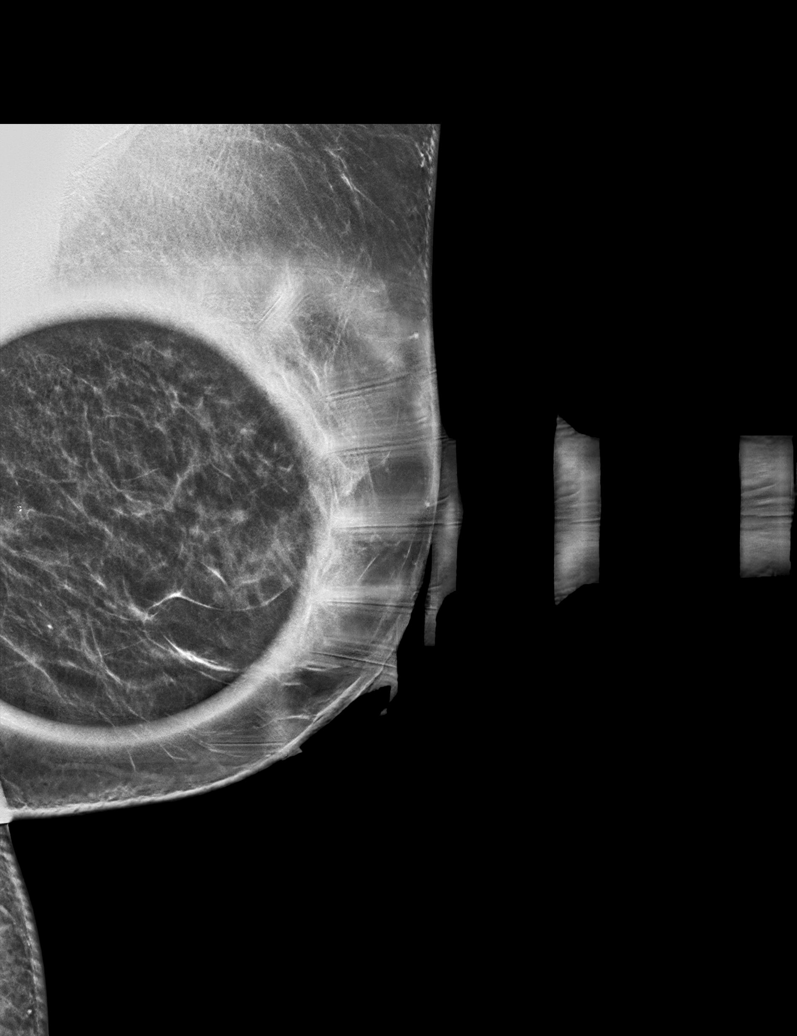

[6 of 36 positions shown; findings below may reference images not displayed]

ACR Breast Density Category c: The breast tissue is heterogeneously
dense, which may obscure small masses.
FINDINGS: Spot compression tomosynthesis images demonstrate a persistent
irregular spiculated mass in the slightly lateral far posterior left
breast. The mass measures approximately 1.1 cm. There are about 3
tiny calcifications within the mass. The full posterior margin of
the mass cannot be visualized on the mammogram due to the far
posterior depth.

There is a firm palpable mass in the lower outer far posterior left
breast at the 5 o'clock position.

Ultrasound targeted to the left breast at 5 o'clock, 4 cm from the
nipple demonstrates an irregular shadowing mass measuring 1.2 x
x 1.1 cm. The mass at least abuts the chest wall on the ultrasound
images. Ultrasound of the left axilla demonstrates multiple
normal-appearing lymph nodes.
IMPRESSION: 1. There is a highly suspicious 1.2 cm mass in the left breast at 5
o'clock. The mass appears to abuts the chest wall on the ultrasound
images, and the full posterior margin is not visualized on the
mammogram images due to its far posterior location.

2.  No evidence of left axillary lymphadenopathy.

RECOMMENDATION:
1. Ultrasound-guided biopsy is recommended for the left breast mass.
This has been scheduled for [DATE] at [DATE] a.m.

2. Due to the patient's family history, breast tissue density and
proximity to the posterior chest wall, MRI is recommended following
ultrasound-guided biopsy.

I have discussed the findings and recommendations with the patient.
If applicable, a reminder letter will be sent to the patient
regarding the next appointment.

BI-RADS CATEGORY  5: Highly suggestive of malignancy.

## 2020-09-10 ENCOUNTER — Ambulatory Visit
Admission: RE | Admit: 2020-09-10 | Discharge: 2020-09-10 | Disposition: A | Discharge status: Home / Self Care | Payer: BC Managed Care – PPO | Source: Ambulatory Visit | Attending: Obstetrics & Gynecology | Admitting: Obstetrics & Gynecology

## 2020-09-10 DIAGNOSIS — R928 Other abnormal and inconclusive findings on diagnostic imaging of breast: Secondary | ICD-10-CM

## 2020-09-10 IMAGING — US US BREAST BX W LOC DEV 1ST LESION IMG BX SPEC US GUIDE*L*
1 series · 12 of 13 positions shown · non-contrast
Comparison: Previous exam(s).
COMPARISON: Previous exam(s).

Addendum:
CLINICAL DATA: 54-year-old female presenting for ultrasound-guided
biopsy of a left breast mass at 5 o'clock.

EXAM:
ULTRASOUND GUIDED LEFT BREAST CORE NEEDLE BIOPSY

[Series 1: us breast bx w loc dev 1st lesion img bx spec us g · 0.06mm/px · 12 of 13 slices shown]
[im 1/13]
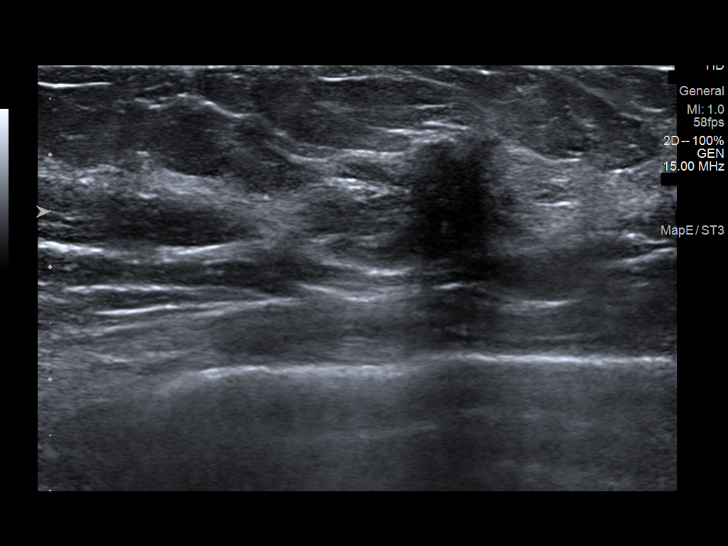
[im 2/13]
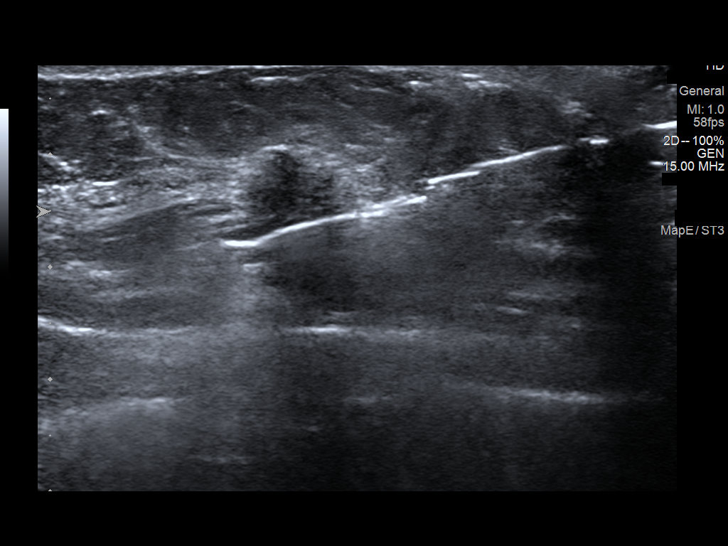
[im 3/13]
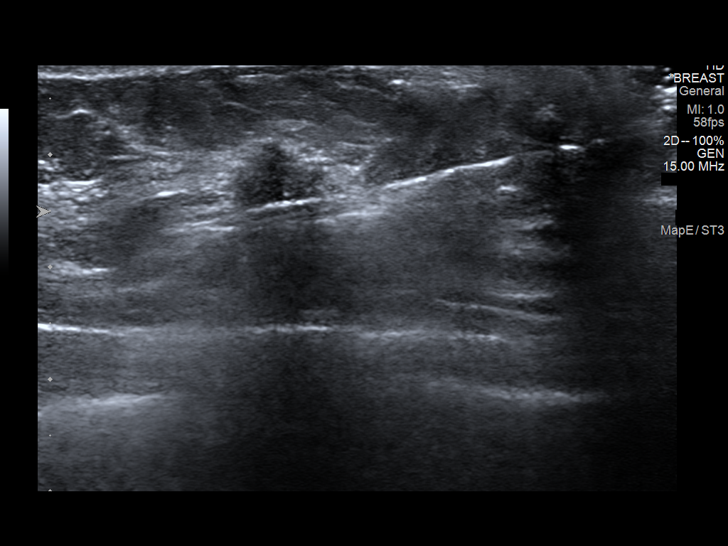
[im 4/13]
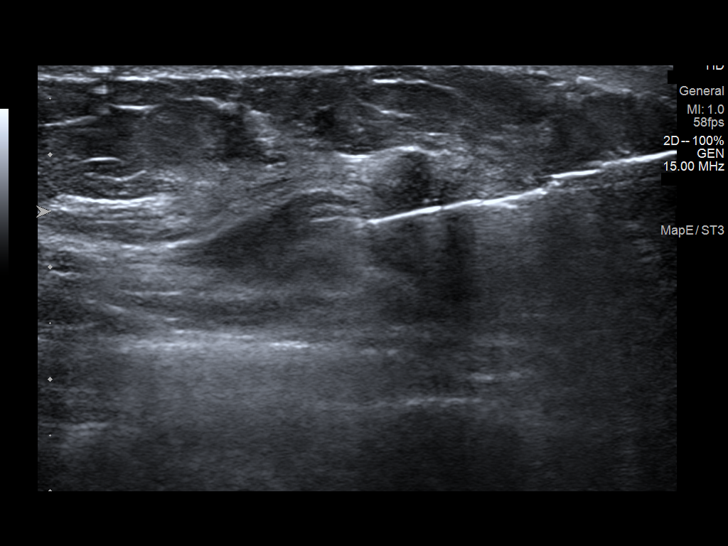
[im 5/13]
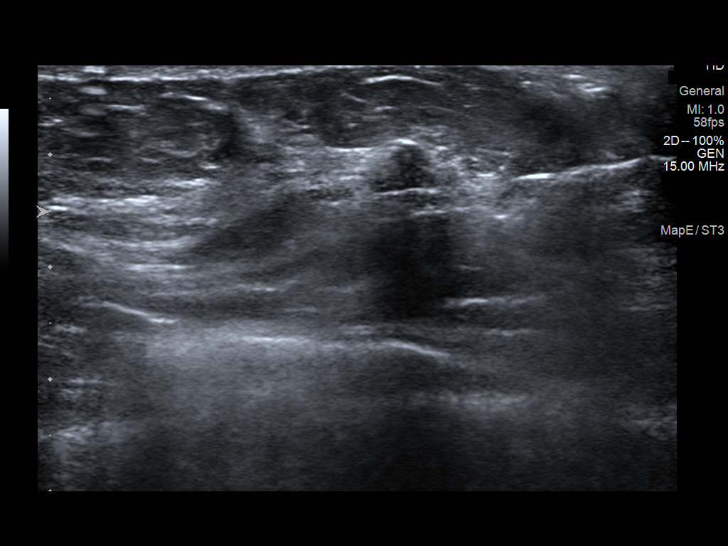
[im 6/13]
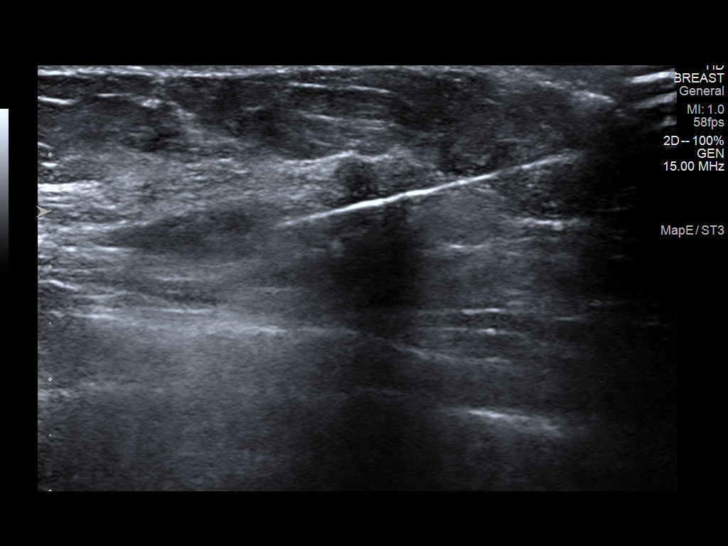
[im 8/13]
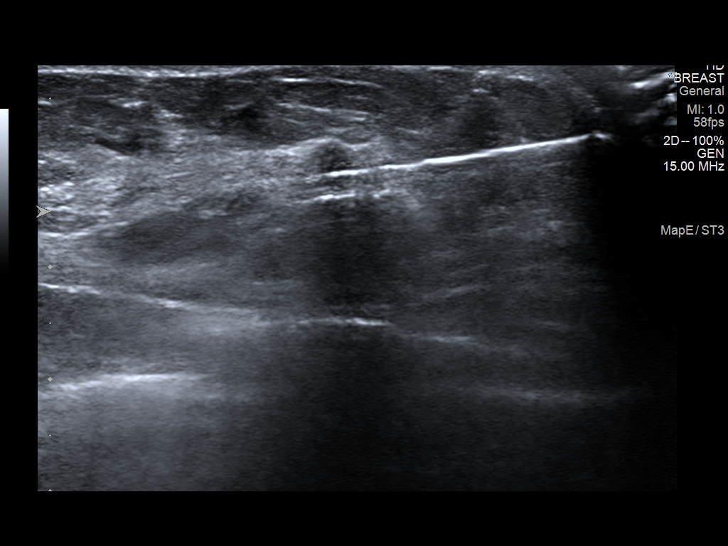
[im 9/13]
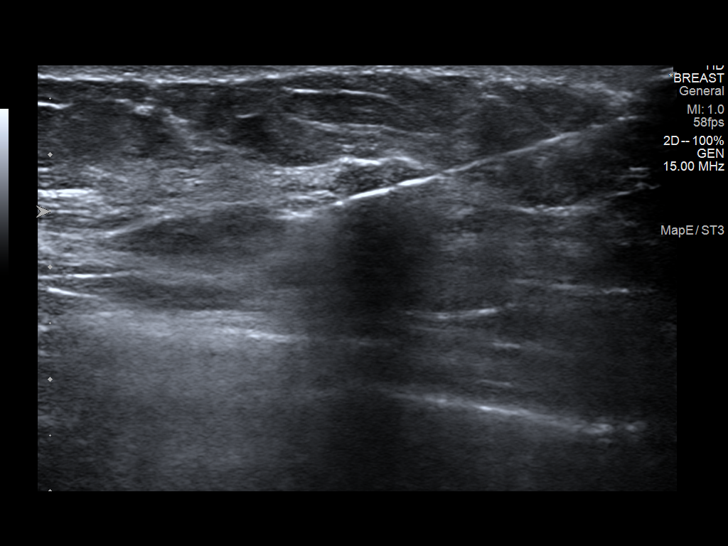
[im 10/13]
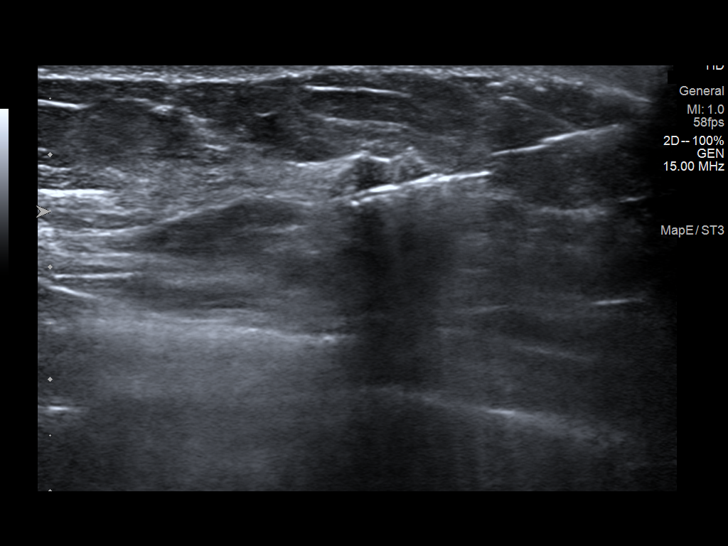
[im 11/13]
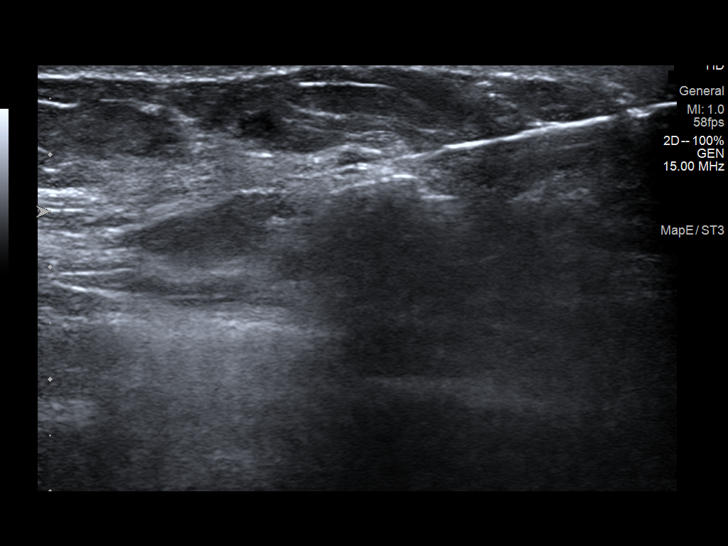
[im 12/13]
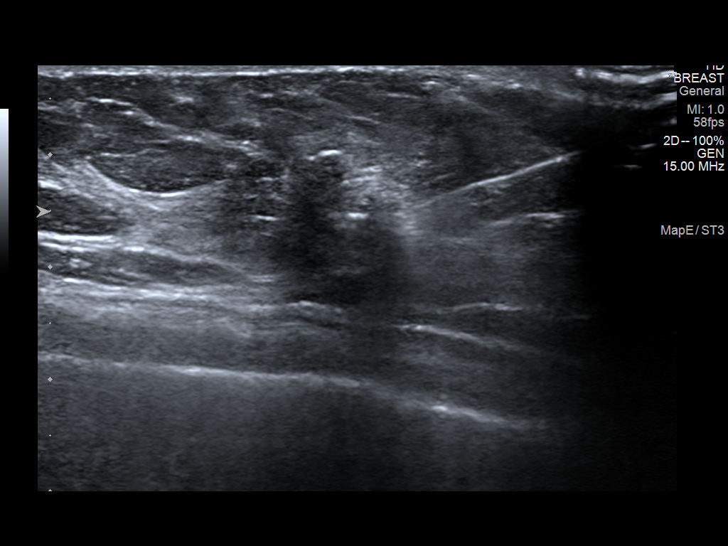
[im 13/13]
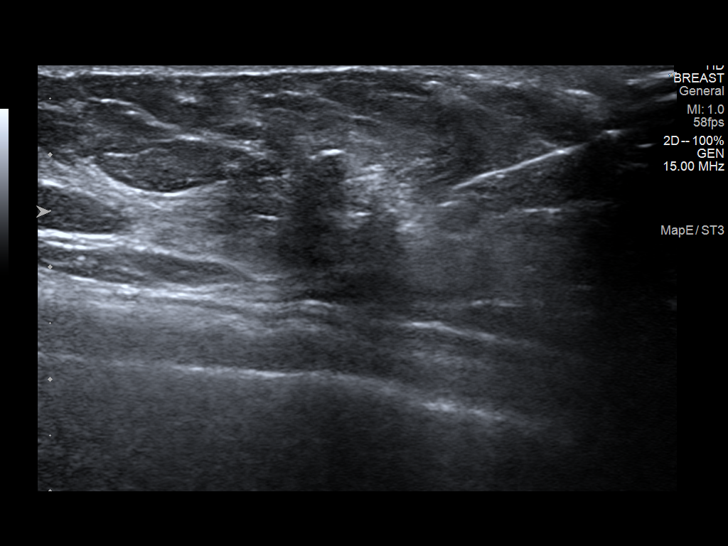

[12 of 13 positions shown; findings below may reference images not displayed]



Lesion quadrant: Lower outer quadrant

Using sterile technique and 1% Lidocaine as local anesthetic, under
direct ultrasound visualization, a 14 gauge PINCKNEY device was
used to perform biopsy of a mass in the left breast at 5 o'clock, 4
cm from the nipple using a lateral approach. At the conclusion of
the procedure a ribbon shaped tissue marker clip was deployed into
the biopsy cavity. Follow up 2 view mammogram was performed and
dictated separately.
IMPRESSION: Ultrasound guided biopsy of a left breast mass at 5 o'clock. No
apparent complications.

ADDENDUM:
Pathology revealed GRADE PINCKNEY INVASIVE MAMMARY CARCINOMA of the
Left breast, 5 o'clock, [29]. E-cadherin is positive supporting a
ductal origin. This was found to be concordant by Dr. PINCKNEY
PINCKNEY.

Pathology results were discussed with the patient by telephone. The
patient reported doing well after the biopsy with tenderness at the
site. Post biopsy instructions and care were reviewed and questions
were answered. The patient was encouraged to call The [REDACTED] for any additional concerns. My direct phone
number was provided.

Surgical consultation has been arranged with Dr. PINCKNEY
at [REDACTED] on [DATE].

Recommendation for a bilateral breast MRI given family history,
breast density and to ensure there is no muscle involvement with
tumor. MRI is scheduled for [DATE].

Pathology results reported by PINCKNEY, RN on [DATE].



Lesion quadrant: Lower outer quadrant

Using sterile technique and 1% Lidocaine as local anesthetic, under
direct ultrasound visualization, a 14 gauge PINCKNEY device was
used to perform biopsy of a mass in the left breast at 5 o'clock, 4
cm from the nipple using a lateral approach. At the conclusion of
the procedure a ribbon shaped tissue marker clip was deployed into
the biopsy cavity. Follow up 2 view mammogram was performed and
dictated separately.
IMPRESSION: Ultrasound guided biopsy of a left breast mass at 5 o'clock. No
apparent complications.

## 2020-09-10 IMAGING — MG MM BREAST LOCALIZATION CLIP
4 series · 4 of 12 positions shown · non-contrast
Comparison: Previous exam(s).

CLINICAL DATA: Post biopsy mammogram of the left breast for clip
placement.

EXAM:
DIAGNOSTIC LEFT MAMMOGRAM POST ULTRASOUND BIOPSY

[L LM synth-2D]
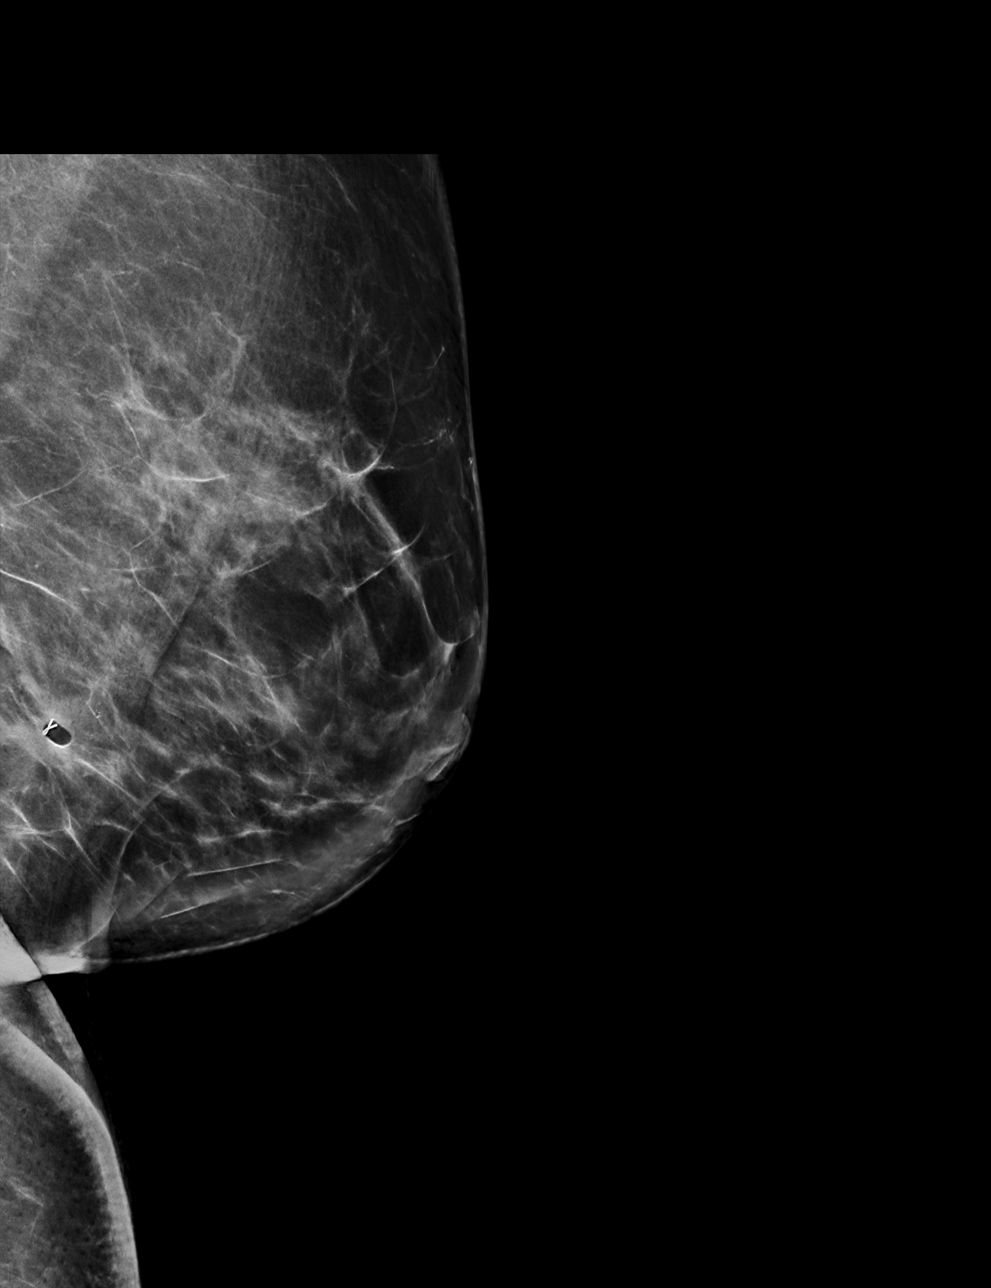

[L XCCL synth-2D]
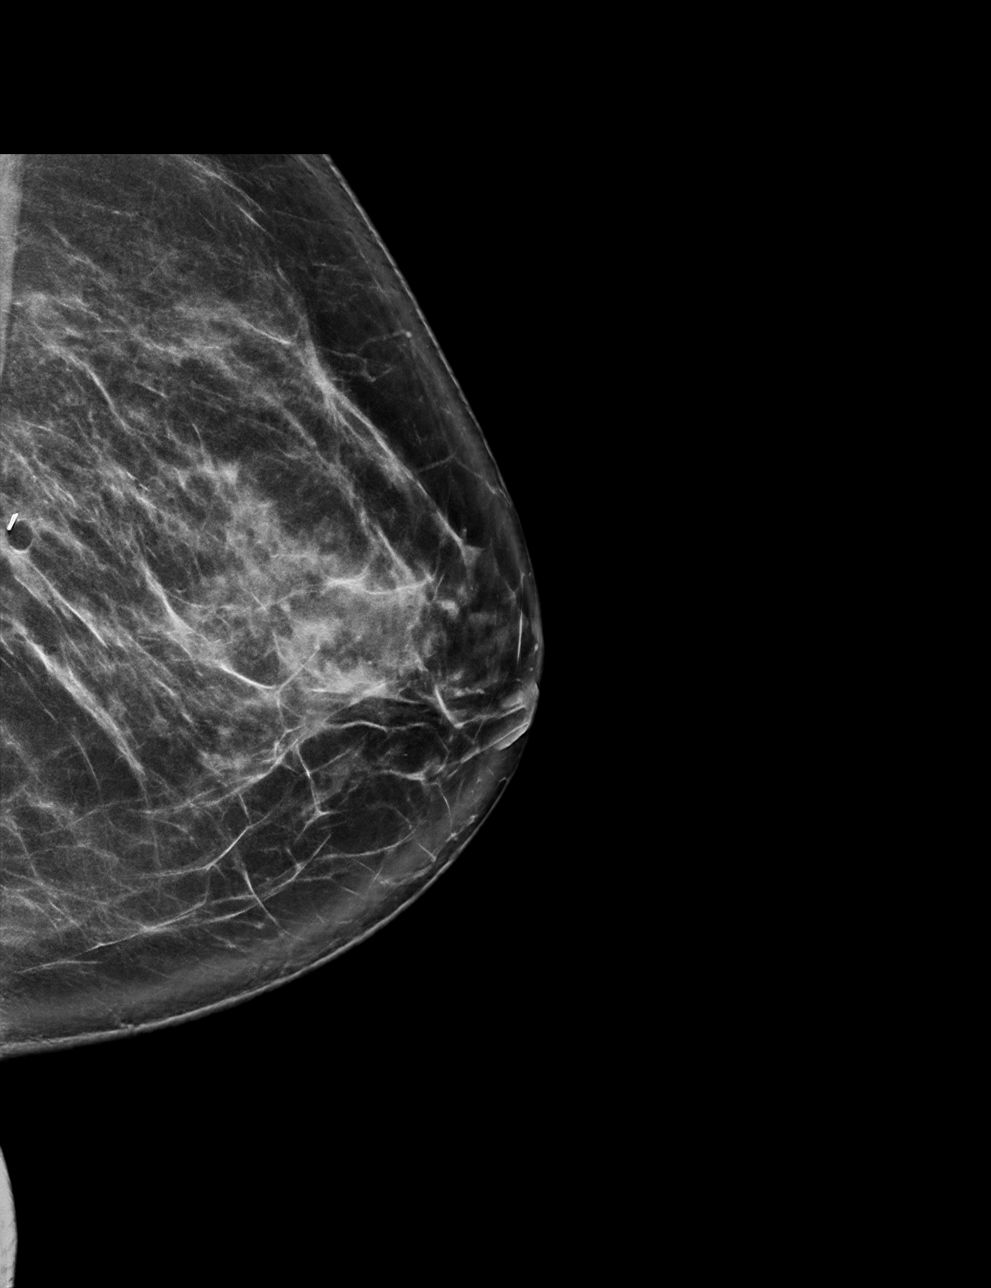

[L XCCL tomo · tomo slice 39/78.0]
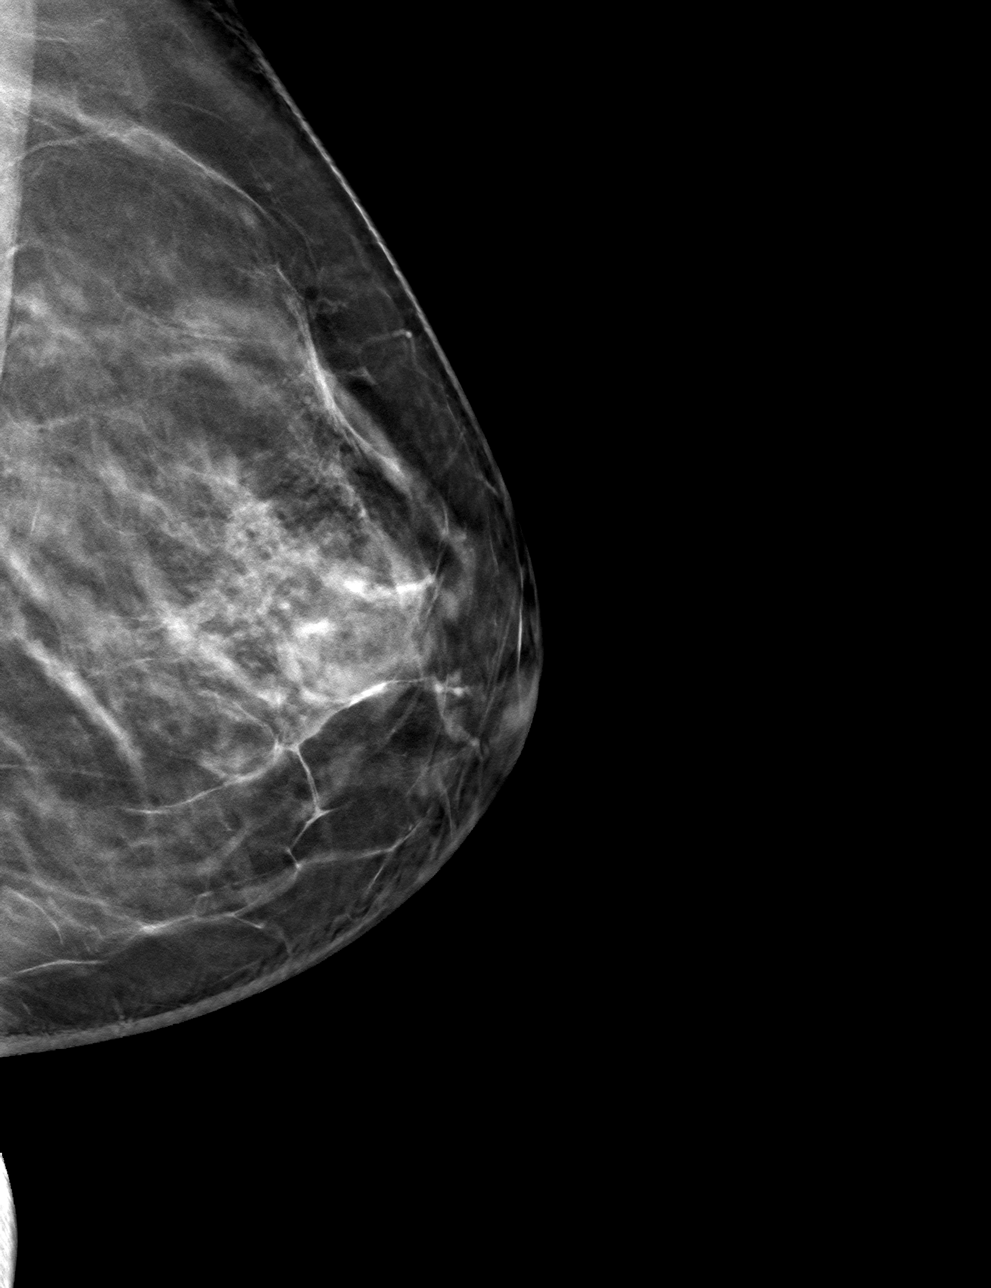

[L LM tomo · tomo slice 45/88.0]
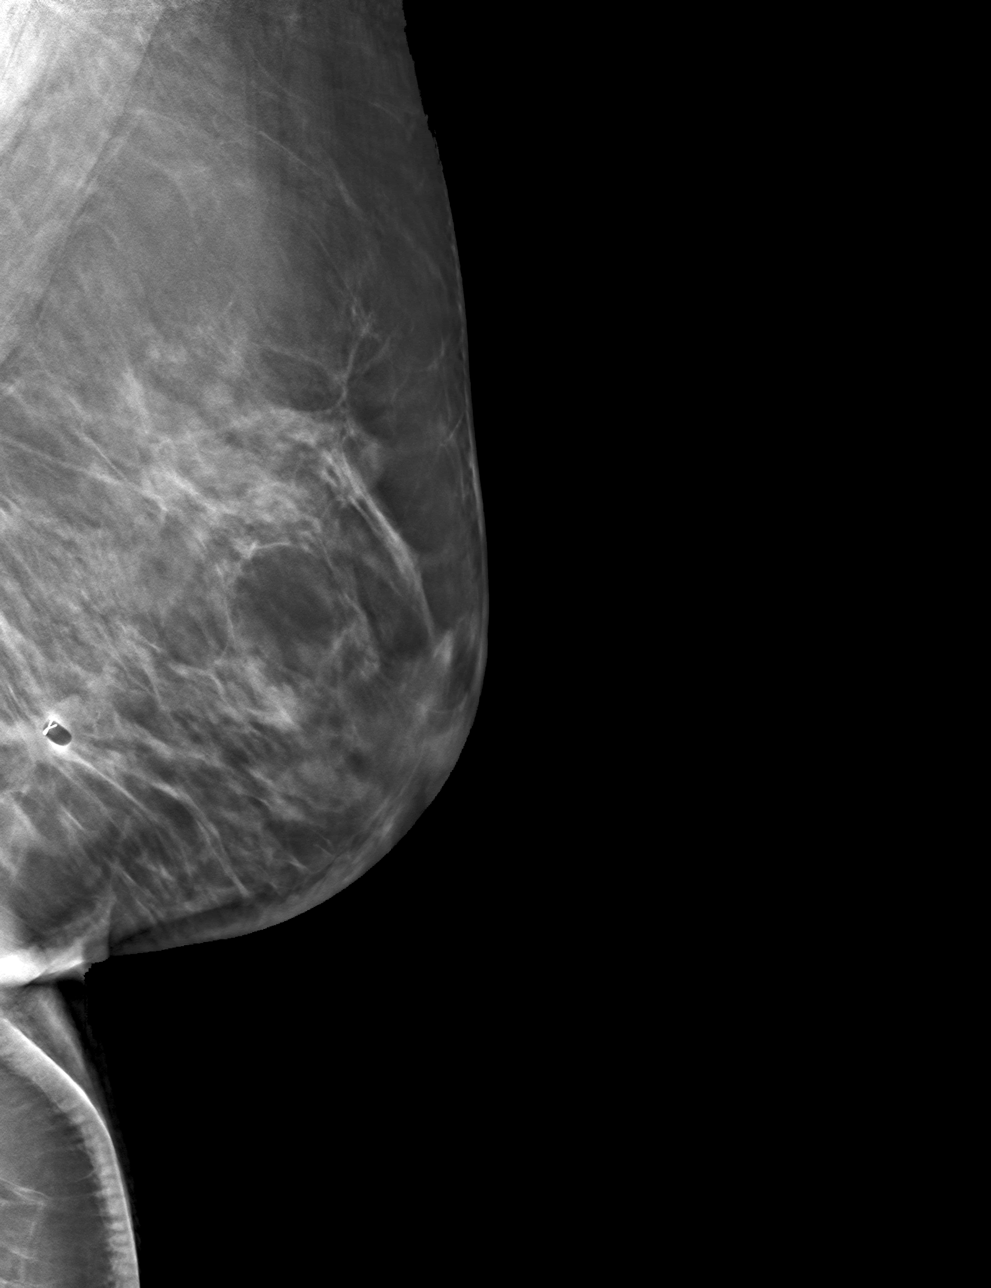

[4 of 12 positions shown; findings below may reference images not displayed]

FINDINGS: Mammographic images were obtained following ultrasound guided biopsy
of a mass in the left breast at 5 o'clock. The biopsy marking clip
is in expected position at the site of biopsy.
IMPRESSION: Appropriate positioning of the ribbon shaped biopsy marking clip at
the site of biopsy in the lower outer left breast.

Final Assessment: Post Procedure Mammograms for Marker Placement

## 2020-09-13 ENCOUNTER — Other Ambulatory Visit: Payer: Self-pay | Admitting: Obstetrics & Gynecology

## 2020-09-13 DIAGNOSIS — C50919 Malignant neoplasm of unspecified site of unspecified female breast: Secondary | ICD-10-CM

## 2020-09-16 ENCOUNTER — Other Ambulatory Visit: Payer: Self-pay

## 2020-09-16 ENCOUNTER — Ambulatory Visit
Admission: RE | Admit: 2020-09-16 | Discharge: 2020-09-16 | Disposition: A | Discharge status: Home / Self Care | Payer: BC Managed Care – PPO | Source: Ambulatory Visit | Attending: Obstetrics & Gynecology | Admitting: Obstetrics & Gynecology

## 2020-09-16 DIAGNOSIS — C50919 Malignant neoplasm of unspecified site of unspecified female breast: Secondary | ICD-10-CM

## 2020-09-16 IMAGING — MR MR BREAST BILAT WO/W CM
8 of 13 series · 32 of 48 positions shown · IV contrast (gadavist)
Comparison: No prior MRI. Prior mammography and LEFT breast
ultrasound.

CLINICAL DATA: 54-year-old with a recent screening detected
biopsy-proven grade 1-2 invasive ductal carcinoma involving the
LOWER OUTER QUADRANT of the LEFT breast. Pretreatment evaluation.

LABS:  Not applicable.
EXAM:
BILATERAL BREAST MRI WITH AND WITHOUT CONTRAST
TECHNIQUE: Multiplanar, multisequence MR images of both breasts were obtained
prior to and following the intravenous administration of 7 ml of
Gadavist.

[Series 2: t2_tirm_tra ipat (a-p) · axial · 3.0mm · 0.70mm/px · 1 of 60 slices shown]
[im 1/60]
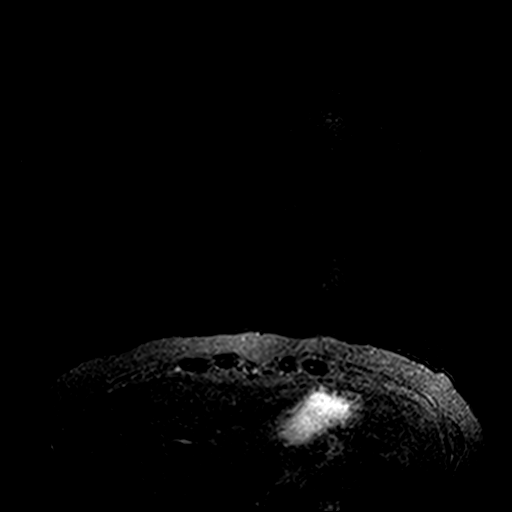

[Series 3: fl3d pre-cm no · axial · non-contrast · 1.2mm · 0.94mm/px · z∈[-92,+80]mm · 5 of 144 slices shown]
[im 1/144]
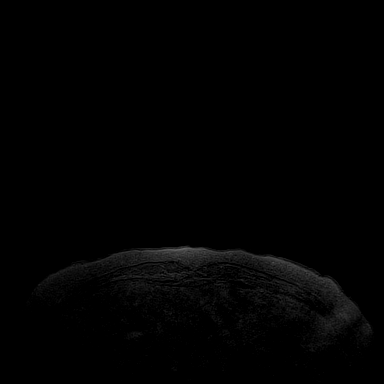
[im 36/144]
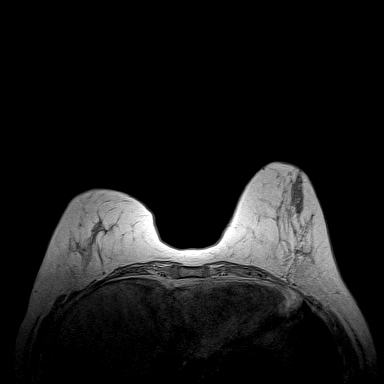
[im 72/144]
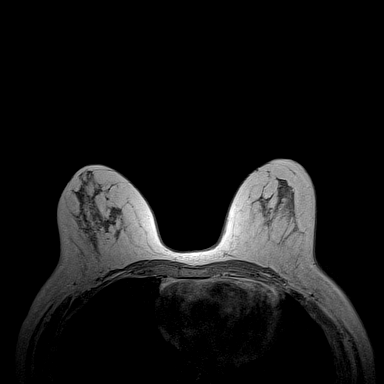
[im 108/144]
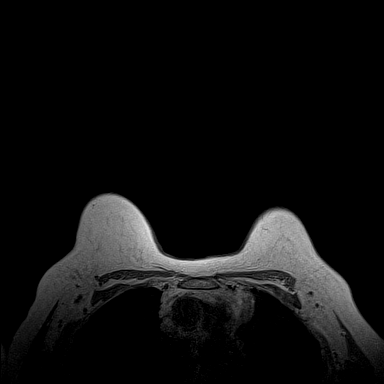
[im 144/144]
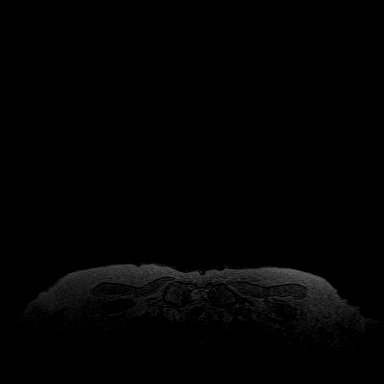

[Series 4: fl3d pre-cm · axial · non-contrast · 1.2mm · 0.94mm/px · z∈[-92,+80]mm · 5 of 144 slices shown]
[im 1/144]
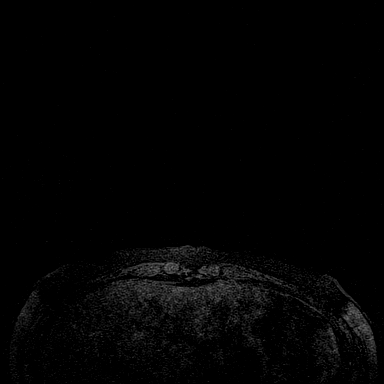
[im 36/144]
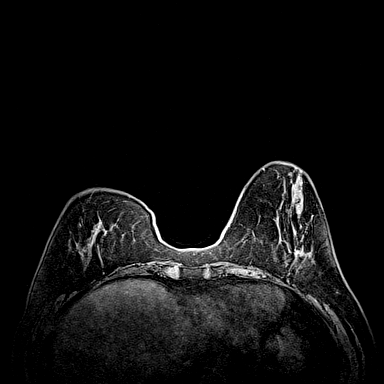
[im 72/144]
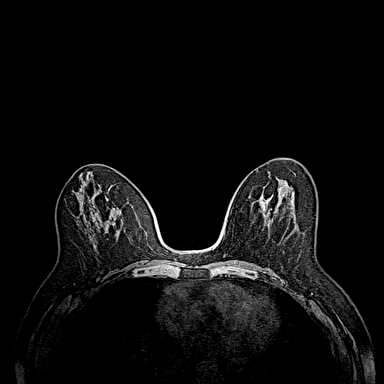
[im 108/144]
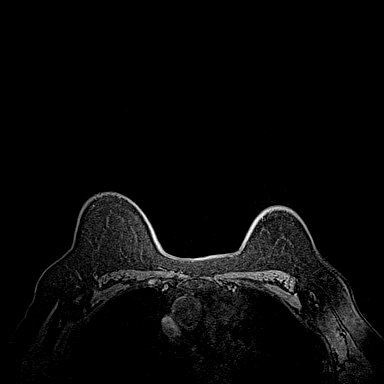
[im 144/144]
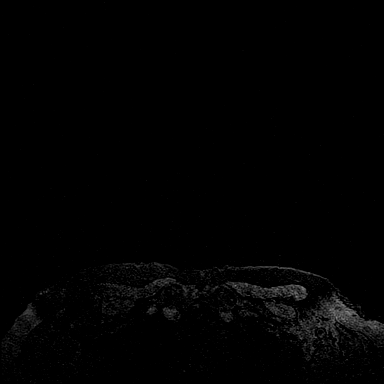

[Series 5: fl3d post-cm 20 · axial · 1.2mm · 0.94mm/px · z∈[-92,+80]mm · 5 of 144 slices shown (1 of 3)]
[im 1/144]
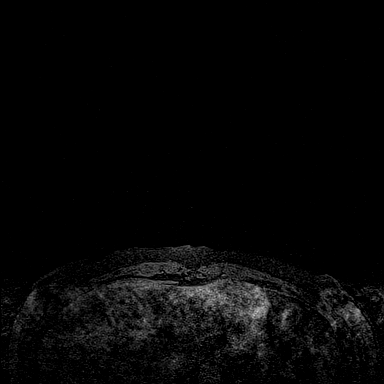
[im 36/144]
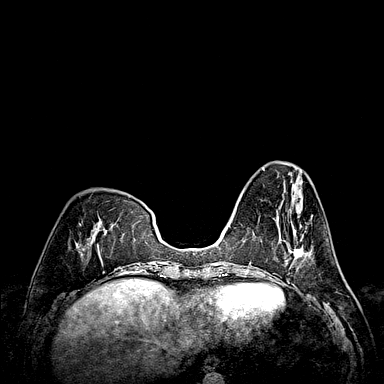
[im 72/144]
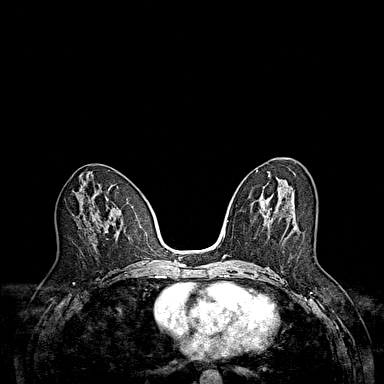
[im 108/144]
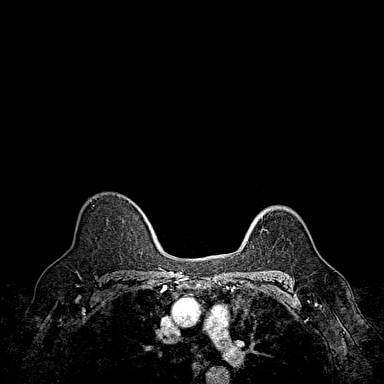
[im 144/144]
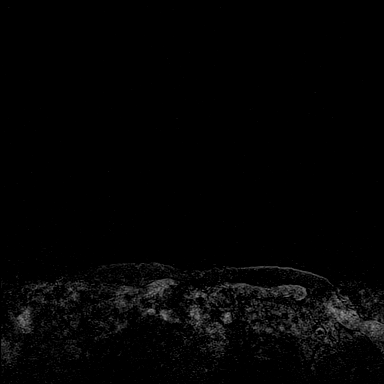

[Series 6: fl3d post-cm 20 · axial · 1.2mm · 0.94mm/px · z∈[-92,+80]mm · 5 of 144 slices shown (2 of 3)]
[im 1/144]
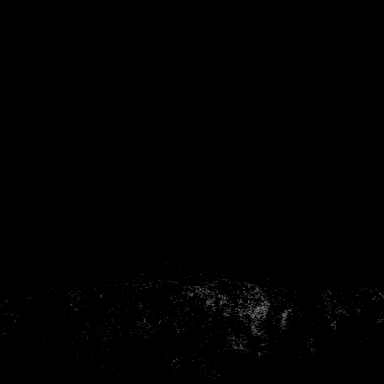
[im 36/144]
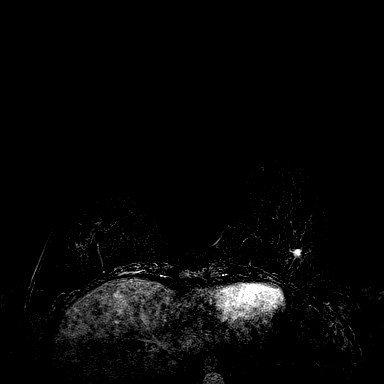
[im 72/144]
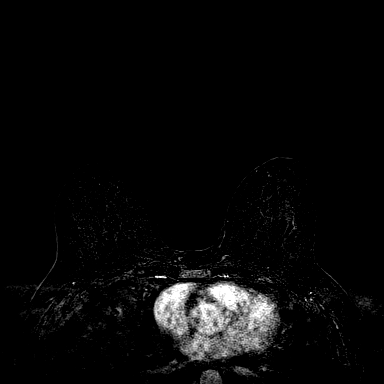
[im 108/144]
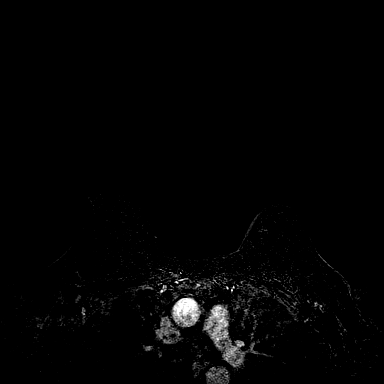
[im 144/144]
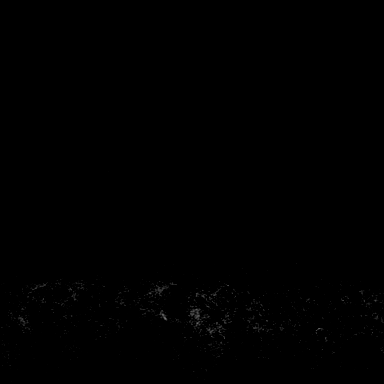

[Series 7: fl3d post-cm 20 · axial · 172.8mm · 0.94mm/px · 1 of 1 slices shown (3 of 3)]
[im 1/1]
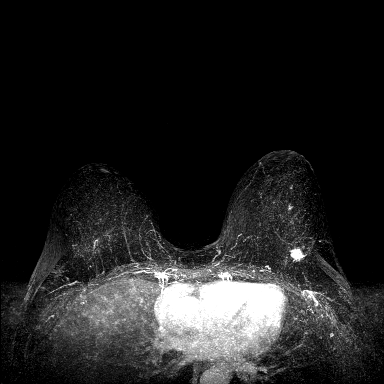

[Series 8: fl3d post-cm 3min · axial · 1.2mm · 0.94mm/px · z∈[-92,+80]mm · 5 of 144 slices shown]
[im 1/144]
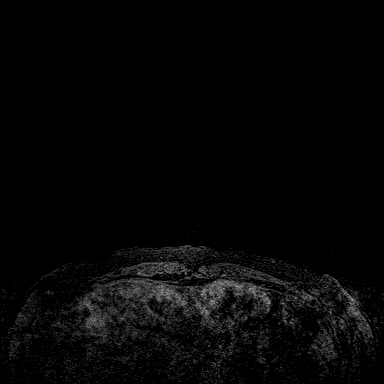
[im 36/144]
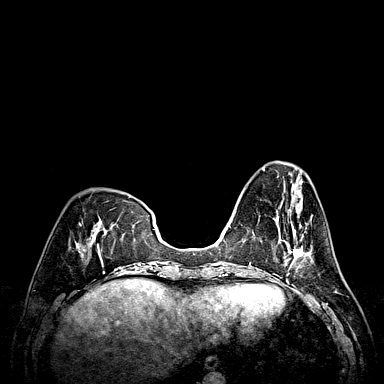
[im 72/144]
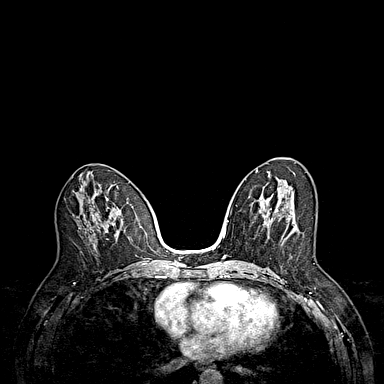
[im 108/144]
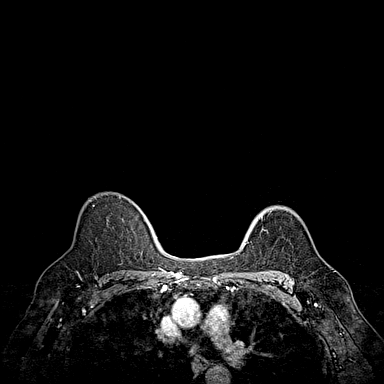
[im 144/144]
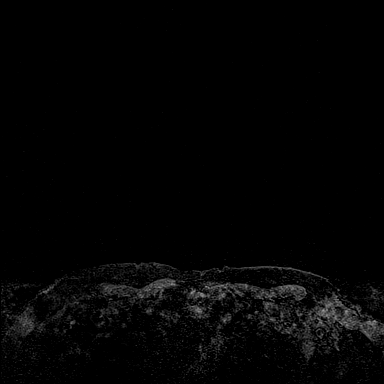

[Series 9: fl3d post-cm 3min_sub · axial · 1.2mm · 0.94mm/px · z∈[-92,+45]mm · 5 of 144 slices shown]
[im 1/144]
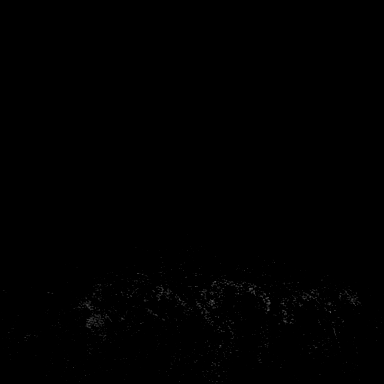
[im 29/144]
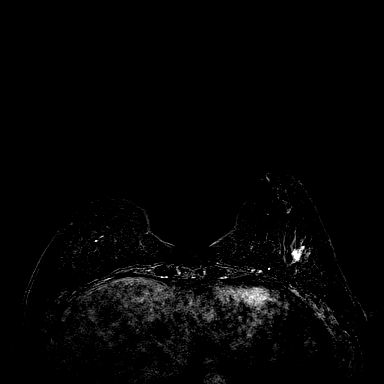
[im 58/144]
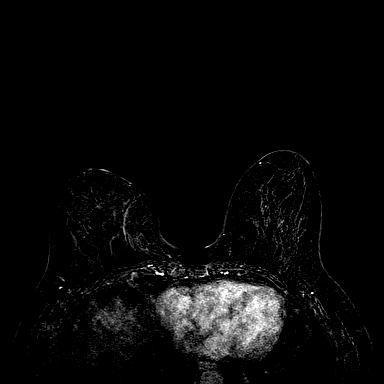
[im 86/144]
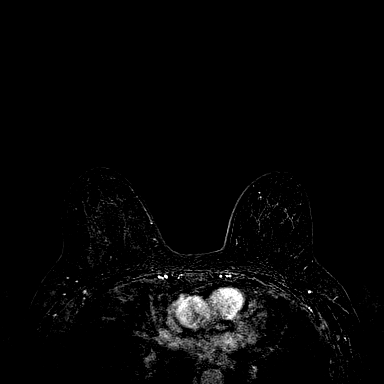
[im 115/144]
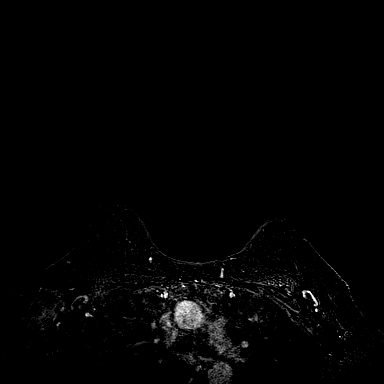

[32 of 48 positions shown; findings below may reference images not displayed]

Three-dimensional MR images were rendered by post-processing of the
original MR data on an independent workstation. The
three-dimensional MR images were interpreted, and findings are
reported in the following complete MRI report for this study. Three
dimensional images were evaluated at the independent interpreting
workstation using the DynaCAD thin client.
FINDINGS: Breast composition: c. Heterogeneous fibroglandular tissue.

Background parenchymal enhancement: Moderate.

Right breast: No suspicious mass or abnormal enhancement.

Left breast: Enhancing irregular mass in the LOWER OUTER QUADRANT at
posterior depth, biopsy invasive ductal carcinoma, associated with
blooming artifact from the biopsy marking clip, measuring
approximately 1.3 x 1.2 x 2.1 cm (AP x transverse x craniocaudal),
demonstrating plateau and washout kinetics. There is no evidence of
chest wall extension as questioned on the recent ultrasound.

Indeterminate enhancing mass in UPPER OUTER QUADRANT at anterior
depth measuring approximately 6 mm, demonstrating plateau kinetics.

Indeterminate possibly branching non-mass enhancement in the LOWER
OUTER QUADRANT at middle depth measuring approximately 7 mm, located
anterior to the biopsy-proven malignancy, demonstrating plateau
kinetics.

Lymph nodes: No pathologic lymphadenopathy.

Ancillary findings:  None.
IMPRESSION: 1. Indeterminate enhancing 6 mm mass in the UPPER OUTER QUADRANT of
the LEFT breast at anterior depth.
2. Indeterminate possibly branching non-mass enhancement in the
LOWER OUTER QUADRANT of the LEFT breast at middle depth.
3. Biopsy-proven invasive ductal carcinoma involving the LOWER OUTER
QUADRANT of the LEFT breast at posterior depth, maximum measurement
2.1 cm.
4. No MRI evidence of malignancy involving the RIGHT breast.
5. No pathologic lymphadenopathy.

RECOMMENDATION:
MRI guided biopsies of the indeterminate mass in the UPPER OUTER
LEFT breast and the indeterminate non-mass enhancement in the LOWER
OUTER LEFT breast.

BI-RADS CATEGORY  4: Suspicious.

## 2020-09-16 MED ORDER — GADOBUTROL 1 MMOL/ML IV SOLN
7.0000 mL | Freq: Once | INTRAVENOUS | Status: AC | PRN
  Administered 2020-09-16: 7 mL via INTRAVENOUS

## 2020-09-22 ENCOUNTER — Encounter: Payer: Self-pay | Admitting: Adult Health

## 2020-09-22 DIAGNOSIS — C50512 Malignant neoplasm of lower-outer quadrant of left female breast: Secondary | ICD-10-CM | POA: Insufficient documentation

## 2020-09-22 DIAGNOSIS — Z170 Estrogen receptor positive status [ER+]: Secondary | ICD-10-CM | POA: Insufficient documentation

## 2020-09-22 NOTE — Progress Notes (Signed)
[facility] CONSULT NOTE  Patient Care Team: Virginia Crews, MD as PCP - General (Family Medicine)  CHIEF COMPLAINTS/PURPOSE OF CONSULTATION:  Newly diagnosed breast cancer  HISTORY OF PRESENTING ILLNESS:  Amy Stephenson 55 y.o. female is here because of recent diagnosis of invasive mammary carcinoma of the left breast. Screening mammogram showed a possible left breast mass, palpable on exam. Diagnostic mammogram and [place] on 09/09/20 showed a 1.2cm mass at the 5 o'clock position and no abnormal axillary lymph nodes. Biopsy on 09/10/20 showed invasive mammary carcinoma, grade 1-2, HER-2 equivocal by IHC (2+), negative by FISH (ratio 1.36), ER+>95%, PR+ 90%, Ki67 5%. Breast MRI on 09/16/20 showed a 0.6cm mass in the upper outer quadrant, non-mass enhancement in the low outer quadrant, and the known malignancy spanning 2.1cm in the left breast with no evidence of right breast malignancy. She presents to the clinic today for initial evaluation and discussion of treatment options.   I reviewed her records extensively and collaborated the history with the patient.  SUMMARY OF ONCOLOGIC HISTORY: Oncology History  Malignant neoplasm of lower-outer quadrant of left breast of female, estrogen receptor positive ([place])  09/10/2020 Initial Diagnosis   Screening mammogram showed a possible left breast mass, palpable on exam. Diagnostic mammogram and US showed a 1.2cm mass at the 5 o'clock position and no abnormal axillary lymph nodes. Biopsy showed invasive mammary carcinoma, grade 1-2, HER-2 equivocal by IHC (2+), negative by FISH (ratio 1.36), ER+>95%, PR+ 90%, Ki67 5%. B   09/10/2020 Cancer Staging   Staging form: Breast, AJCC 8th Edition - Clinical stage from 09/10/2020: Stage IB (cT2, cN0, cM0, G2, ER+, PR+, HER2-) - Signed by Gardenia Phlegm, NP on 09/22/2020 Stage prefix: Initial diagnosis Histologic grading system: 3 grade system   09/16/2020 Breast MRI   0.6cm mass in the  upper outer quadrant, non-mass enhancement in the low outer quadrant, and the known malignancy spanning 2.1cm in the left breast with no evidence of right breast malignancy.     MEDICAL HISTORY: Diabetes   SURGICAL HISTORY: No prior surgeries   SOCIAL HISTORY: Denies any tobacco alcohol or recreational drug use FAMILY HISTORY: Her mother and aunt had breast cancers   ALLERGIES:  has no allergies on file.  MEDICATIONS:  Current Outpatient Medications  Medication Sig Dispense Refill  . Dapagliflozin-Metformin HCl ER (XIGDUO XR) 03-999 MG TB24 Xigduo XR 10 mg-1,000 mg tablet,extended release    . escitalopram (LEXAPRO) 10 MG tablet Take 1 tablet (10 mg total) by mouth daily. Needs ov. Thanks. 90 tablet 0  . MULTIPLE VITAMIN PO Take 0.5 tablets by mouth.     . SitaGLIPtin-MetFORMIN HCl (JANUMET XR) [phone] MG TB24 Janumet XR 100 mg-1,000 mg tablet,extended release    . zolpidem (AMBIEN) 10 MG tablet zolpidem 10 mg tablet     No current facility-administered medications for this visit.    REVIEW OF SYSTEMS:   Constitutional: Denies fevers, chills or abnormal night sweats Breast: Palpable left breast mass All other systems were reviewed with the patient and are negative.  PHYSICAL EXAMINATION: ECOG PERFORMANCE STATUS: 1 - Symptomatic but completely ambulatory  Vitals:   09/23/20 1553  BP: 131/81  Pulse: (!) 105  Resp: 20  Temp: 97.9 F (36.6 C)  SpO2: 98%   Filed Weights   09/23/20 1553  Weight: 153 lb 1.6 oz (69.4 kg)     RADIOGRAPHIC STUDIES: I have personally reviewed the radiological reports and agreed with the findings in the report.  ASSESSMENT  AND PLAN:  Malignant neoplasm of lower-outer quadrant of left breast of female, estrogen receptor positive ([place]) 09/10/2020: Screening mammogram detected left breast mass later palpable, 1.2 cm at 5 o'clock position, axilla negative, biopsy: Grade 1-2 IDC, ER greater than 95%, PR 90%, Ki-67 5%, HER-2 negative ratio  1.36  09/16/2020: Breast MRI: 0.6 cm mass UOQ, non-mass enhancement LOQ spanning 2.1 cm, MRI guided biopsy is recommended for the indeterminate mass in the non-mass enhancement.  Pathology and radiology counseling:Discussed with the patient, the details of pathology including the type of breast cancer,the clinical staging, the significance of ER, PR and HER-2/neu receptors and the implications for treatment. After reviewing the pathology in detail, we proceeded to discuss the different treatment options between surgery, radiation, chemotherapy, antiestrogen therapies.  Recommendations: 1. Breast conserving surgery with sentinel lymph node surgery (depending on the MRI biopsies and genetic testing) 2. Oncotype DX testing to determine if chemotherapy would be of any benefit followed by 3. Adjuvant radiation therapy if she undergoes breast conserving surgery 4. Adjuvant antiestrogen therapy  Oncotype counseling: I discussed Oncotype DX test. I explained to the patient that this is a 21 gene panel to evaluate patient tumors DNA to calculate recurrence score. This would help determine whether patient has high risk or low risk breast cancer. She understands that if her tumor was found to be high risk, she would benefit from systemic chemotherapy. If low risk, no need of chemotherapy.  Return to clinic after surgery to discuss final pathology report and then determine if Oncotype DX testing will need to be sent.     All questions were answered. The patient knows to call the clinic with any problems, questions or concerns.   Rulon Eisenmenger, MD, MPH 09/23/2020    I, Molly Dorshimer, am acting as scribe for Nicholas Lose, MD.  I have reviewed the above documentation for accuracy and completeness, and I agree with the above.

## 2020-09-23 ENCOUNTER — Inpatient Hospital Stay: Payer: BC Managed Care – PPO | Attending: Hematology and Oncology | Admitting: Hematology and Oncology

## 2020-09-23 ENCOUNTER — Other Ambulatory Visit: Payer: Self-pay

## 2020-09-23 ENCOUNTER — Other Ambulatory Visit: Payer: Self-pay | Admitting: *Deleted

## 2020-09-23 ENCOUNTER — Telehealth: Payer: Self-pay | Admitting: Licensed Clinical Social Worker

## 2020-09-23 ENCOUNTER — Encounter: Payer: Self-pay | Admitting: Emergency Medicine

## 2020-09-23 ENCOUNTER — Other Ambulatory Visit: Payer: Self-pay | Admitting: Licensed Clinical Social Worker

## 2020-09-23 ENCOUNTER — Other Ambulatory Visit: Payer: Self-pay | Admitting: General Surgery

## 2020-09-23 DIAGNOSIS — Z170 Estrogen receptor positive status [ER+]: Secondary | ICD-10-CM | POA: Insufficient documentation

## 2020-09-23 DIAGNOSIS — E119 Type 2 diabetes mellitus without complications: Secondary | ICD-10-CM | POA: Diagnosis not present

## 2020-09-23 DIAGNOSIS — Z7984 Long term (current) use of oral hypoglycemic drugs: Secondary | ICD-10-CM | POA: Diagnosis not present

## 2020-09-23 DIAGNOSIS — Z803 Family history of malignant neoplasm of breast: Secondary | ICD-10-CM | POA: Diagnosis not present

## 2020-09-23 DIAGNOSIS — Z79899 Other long term (current) drug therapy: Secondary | ICD-10-CM | POA: Diagnosis not present

## 2020-09-23 DIAGNOSIS — Z006 Encounter for examination for normal comparison and control in clinical research program: Secondary | ICD-10-CM

## 2020-09-23 DIAGNOSIS — C50512 Malignant neoplasm of lower-outer quadrant of left female breast: Secondary | ICD-10-CM

## 2020-09-23 DIAGNOSIS — R928 Other abnormal and inconclusive findings on diagnostic imaging of breast: Secondary | ICD-10-CM

## 2020-09-23 DIAGNOSIS — N6321 Unspecified lump in the left breast, upper outer quadrant: Secondary | ICD-10-CM

## 2020-09-23 NOTE — Research (Signed)
[facility] PO 1754 - Procurement of Human Biospecimens for the Discovery and Validation of Biomarkers for the Prediction, Diagnosis, and Management of Disease  Consent:  Patient Amy Stephenson was identified by this research coordinator as a potential candidate for the above listed study.  This Clinical Research Coordinator met with Amy Stephenson, MRN [id number], on 09/23/20 in a manner and location that ensures patient privacy to discuss participation in the above listed research study.  A copy of the informed consent document and separate HIPAA Authorization was provided to the patient.  Patient reads, speaks, and understands English.    Patient was provided with the business card of this Coordinator and encouraged to contact the research team with any questions.  Patient was provided the option of taking informed consent documents home to review and was encouraged to review at their convenience with their support network, including other care providers. Patient is comfortable with making a decision regarding study participation today.  As outlined in the informed consent form, this Coordinator and Amy Stephenson discussed the purpose of the research study, the investigational nature of the study, study procedures and requirements for study participation, potential risks and benefits of study participation, as well as alternatives to participation. The patient understands participation is voluntary and they may withdraw from study participation at any time.  Patient understands enrollment is pending full eligibility review.   Confidentiality and how the patient's information will be used as part of study participation were discussed.  Patient was informed there is reimbursement provided for their time and effort spent on trial participation.  The patient is encouraged to discuss research study participation with their insurance provider to determine what costs they may incur as part of study participation,  including research related injury.    All questions were answered to patient's satisfaction.  The informed consent and separate HIPAA Authorization was reviewed page by page.  The patient's mental and emotional status is appropriate to provide informed consent, and the patient verbalizes an understanding of study participation.  Patient has agreed to participate in the above listed research study and has voluntarily signed the informed consent version 2 and separate HIPAA Authorization, version 5 on 09/23/20 at 1629.  The patient was provided with a copy of the signed informed consent form and separate HIPAA Authorization for their reference.  No study specific procedures were obtained prior to the signing of the informed consent document.  Approximately 15 minutes were spent with the patient reviewing the informed consent documents.   Eligibility:  This Coordinator has reviewed this patient's inclusion and exclusion criteria and confirmed Amy Stephenson is eligible for study participation.  Patient will continue with enrollment.  Plan:  Will plan to collect blood specimen on Monday at patients already scheduled lab appointment.  The patient was urged to call with any questions or concerns.   Kory Isley Clinical Research Coordinator I  09/23/20 4:41 PM  

## 2020-09-23 NOTE — Telephone Encounter (Signed)
Amy Stephenson has been scheduled to see Amy Stephenson on 4/4 at 1pm.

## 2020-09-23 NOTE — Assessment & Plan Note (Signed)
09/10/2020: Screening mammogram detected left breast mass later palpable, 1.2 cm at 5 o'clock position, axilla negative, biopsy: Grade 1-2 IDC, ER greater than 95%, PR 90%, Ki-67 5%, HER-2 negative ratio 1.36  09/16/2020: Breast MRI: 0.6 cm mass UOQ, non-mass enhancement LOQ spanning 2.1 cm, MRI guided biopsy is recommended for the indeterminate mass in the non-mass enhancement.  Pathology and radiology counseling:Discussed with the patient, the details of pathology including the type of breast cancer,the clinical staging, the significance of ER, PR and HER-2/neu receptors and the implications for treatment. After reviewing the pathology in detail, we proceeded to discuss the different treatment options between surgery, radiation, chemotherapy, antiestrogen therapies.  Recommendations: 1. Breast conserving surgery followed by 2. Oncotype DX testing to determine if chemotherapy would be of any benefit followed by 3. Adjuvant radiation therapy followed by 4. Adjuvant antiestrogen therapy  Oncotype counseling: I discussed Oncotype DX test. I explained to the patient that this is a 21 gene panel to evaluate patient tumors DNA to calculate recurrence score. This would help determine whether patient has high risk or low risk breast cancer. She understands that if her tumor was found to be high risk, she would benefit from systemic chemotherapy. If low risk, no need of chemotherapy.  Return to clinic after surgery to discuss final pathology report and then determine if Oncotype DX testing will need to be sent.

## 2020-09-24 ENCOUNTER — Other Ambulatory Visit (HOSPITAL_COMMUNITY): Payer: Self-pay | Admitting: Diagnostic Radiology

## 2020-09-24 ENCOUNTER — Ambulatory Visit
Admission: RE | Admit: 2020-09-24 | Discharge: 2020-09-24 | Disposition: A | Discharge status: Home / Self Care | Payer: BC Managed Care – PPO | Source: Ambulatory Visit | Attending: General Surgery | Admitting: General Surgery

## 2020-09-24 DIAGNOSIS — R928 Other abnormal and inconclusive findings on diagnostic imaging of breast: Secondary | ICD-10-CM

## 2020-09-24 IMAGING — MG MM BREAST LOCALIZATION CLIP
4 series · 4 of 12 positions shown · non-contrast
Comparison: Previous exam(s).

CLINICAL DATA: Status post MR guided core biopsy of 2 sites in the
LEFT breast.

EXAM:
DIAGNOSTIC LEFT MAMMOGRAM POST MRI BIOPSY

[L CC synth-2D]
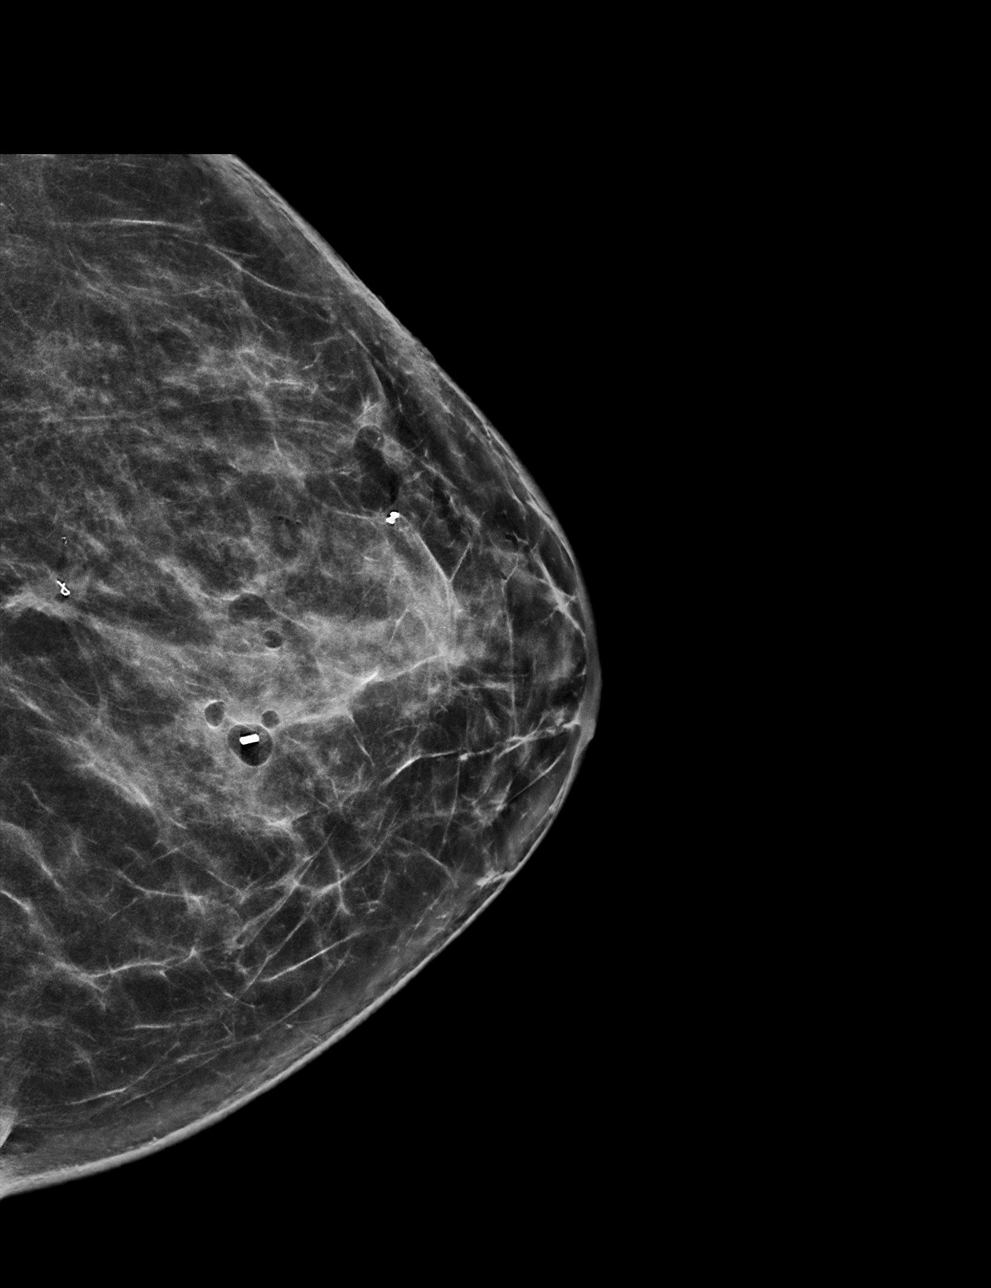

[L ML synth-2D]
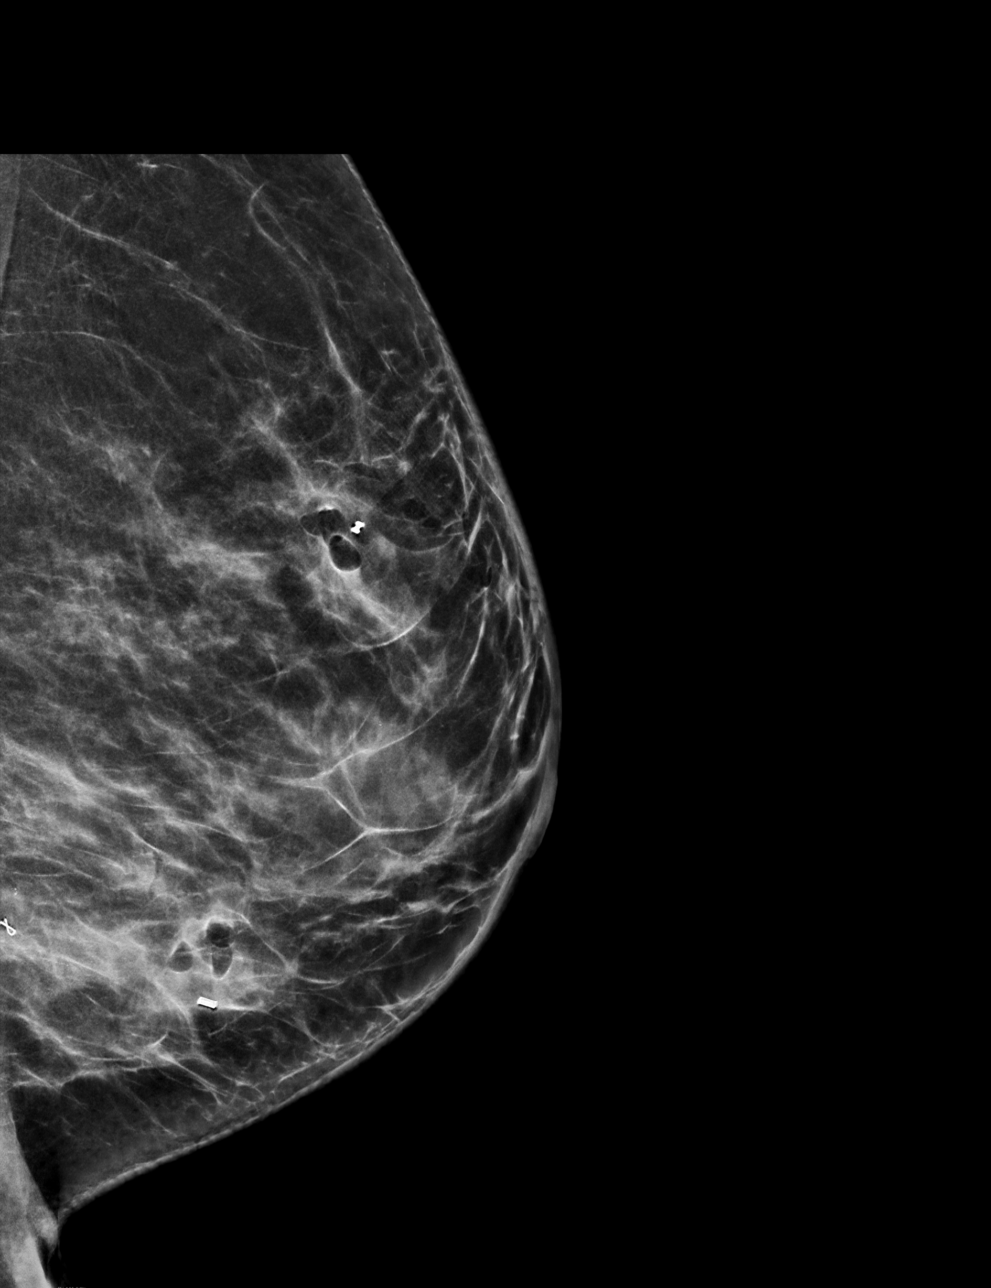

[L CC tomo · tomo slice 35/68.0]
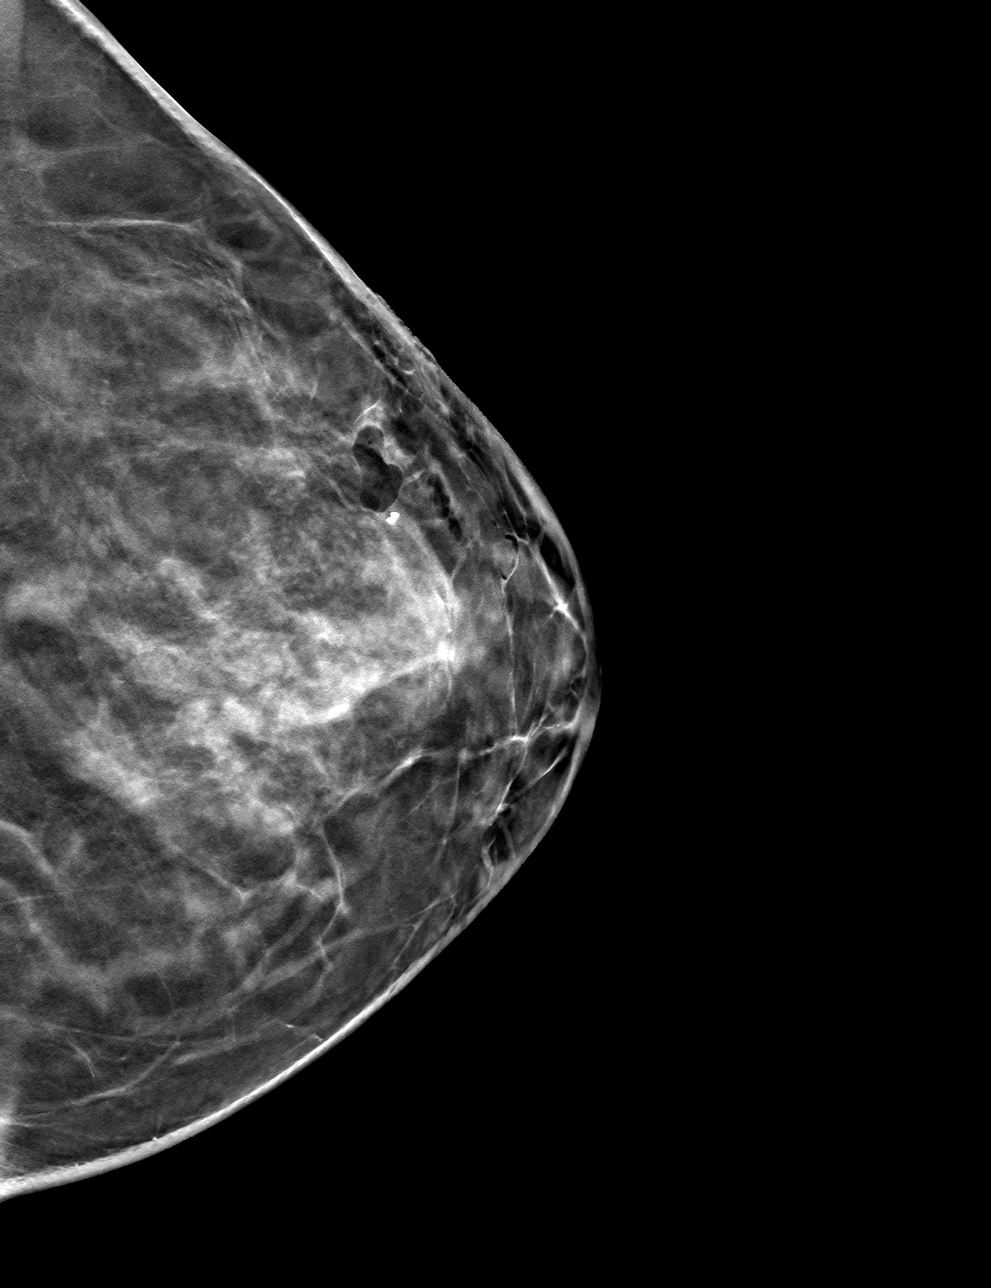

[L ML tomo · tomo slice 33/66.0]
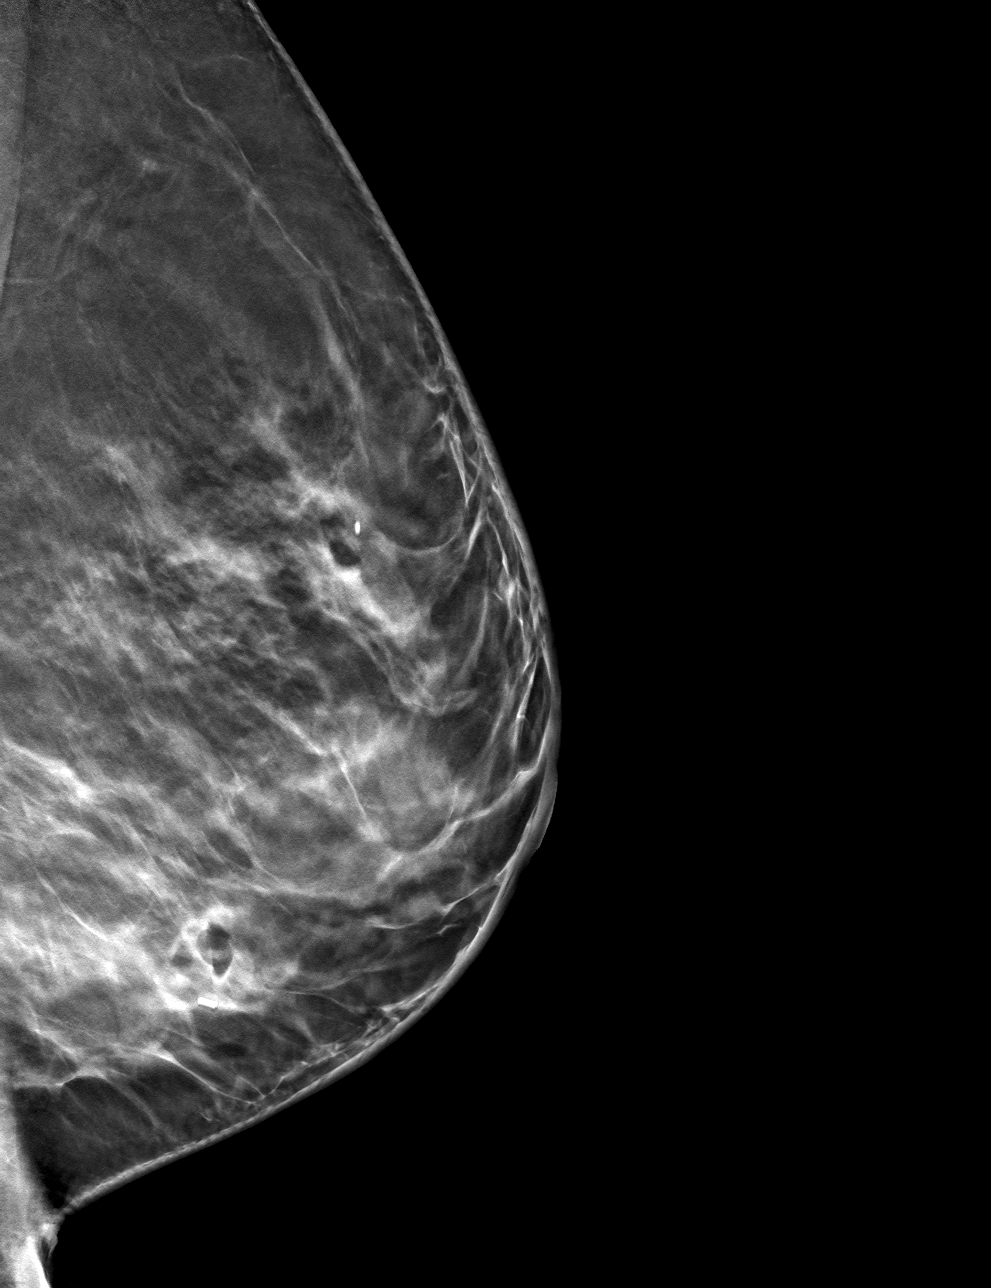

[4 of 12 positions shown; findings below may reference images not displayed]

FINDINGS: Mammographic images were obtained following MRI guided biopsy of
enhancement in the UPPER-OUTER QUADRANT of the LEFT breast and
placement of a barbell clip. The biopsy marking clip is in expected
position at the site of biopsy.

Following biopsy enhancement in the LOWER OUTER QUADRANT of the LEFT
breast, a cylinder-shaped clip was placed. The clip is approximately
2.9 centimeters MEDIAL to the biopsy site.
IMPRESSION: Appropriate positioning of the barbell shaped biopsy marking clip at
the site of biopsy in the UPPER-OUTER QUADRANT of the LEFT breast.

Cylinder-shaped clip migrated 2.9 centimeters MEDIAL to the biopsy
site.

Final Assessment: Post Procedure Mammograms for Marker Placement

## 2020-09-24 IMAGING — MR MR BREAST BX W LOC DEV 1ST LESION IMAGE BX SPEC MR GUIDE*L*
7 of 10 series · 31 of 48 positions shown · IV contrast (7 ML GADAVIST)
Comparison: Previous exams.
COMPARISON: Previous exams.

Addendum:
CLINICAL DATA: PATIENT PRESENTS FOR MR GUIDED CORE BIOPSY OF 2
SITES IN THE LEFT BREAST. RECENT ULTRASOUND-GUIDED CORE BIOPSY OF
LEFT BREAST SHOWS GRADE 1-2 INVASIVE MAMMARY CARCINOMA IN THE 5
o'clock LOCATION.

EXAM:
MRI GUIDED CORE NEEDLE BIOPSY OF THE LEFT BREAST
TECHNIQUE: Multiplanar, multisequence MR imaging of the LEFT breast was
performed both before and after administration of intravenous
contrast.
CONTRAST:  7mL GADAVIST GADOBUTROL 1 MMOL/ML IV SOLN

[Series 2: fiducial unilateral · sagittal · 2.0mm · 1.33mm/px · 3 of 52 slices shown]
[im 1/52]
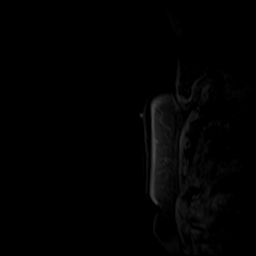
[im 26/52]
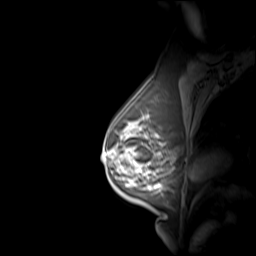
[im 52/52]
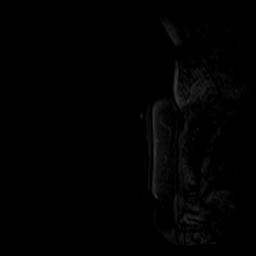

[Series 3: dynamic pre · axial · non-contrast · 1.3mm · 0.73mm/px · z∈[-119,+87]mm · 5 of 160 slices shown]
[im 1/160]
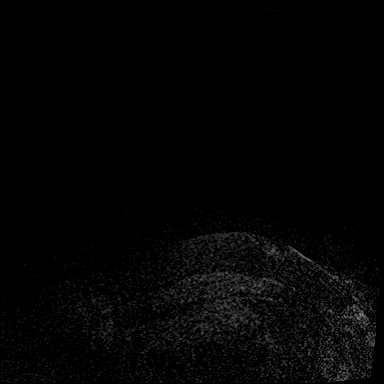
[im 40/160]
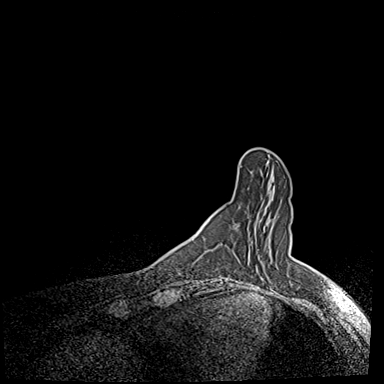
[im 80/160]
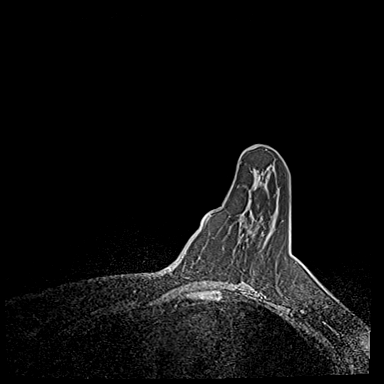
[im 120/160]
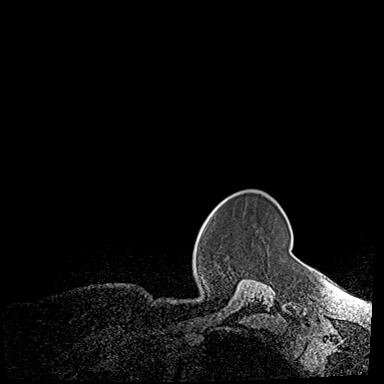
[im 160/160]
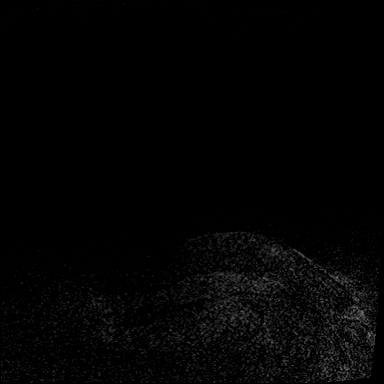

[Series 4: dynamic post 20 · axial · 1.3mm · 0.73mm/px · z∈[-119,+87]mm · 5 of 160 slices shown (1 of 2)]
[im 1/160]
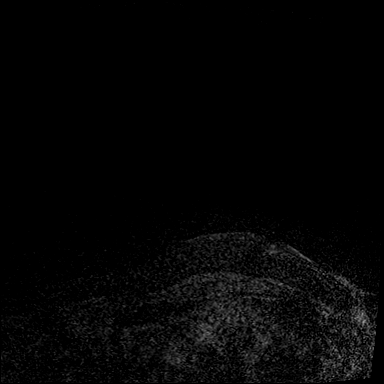
[im 40/160]
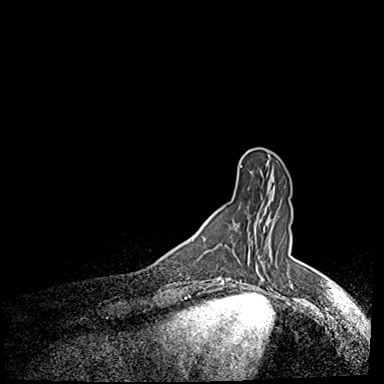
[im 80/160]
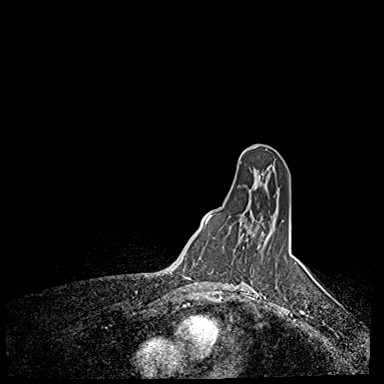
[im 120/160]
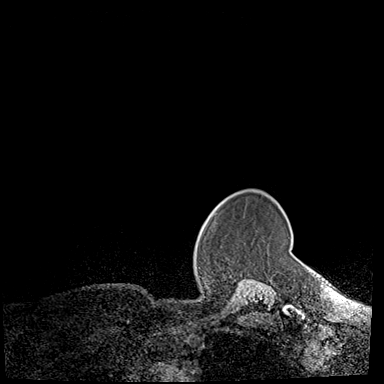
[im 160/160]
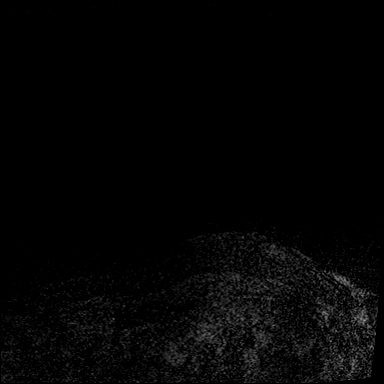

[Series 5: dynamic post 20 · axial · 1.3mm · 0.73mm/px · z∈[-119,+87]mm · 5 of 160 slices shown (2 of 2)]
[im 1/160]
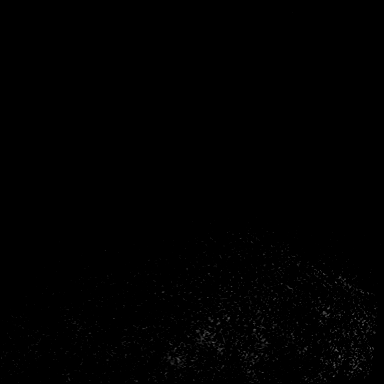
[im 40/160]
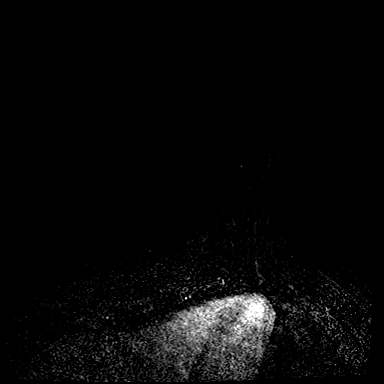
[im 80/160]
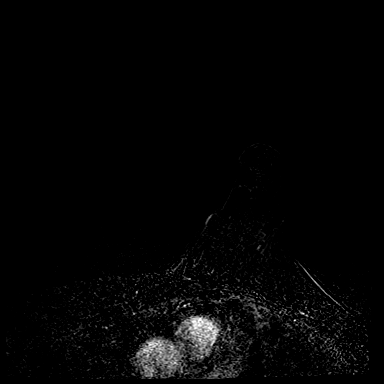
[im 120/160]
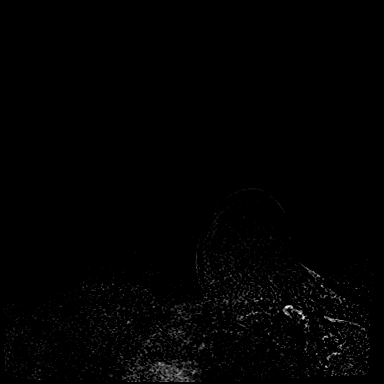
[im 160/160]
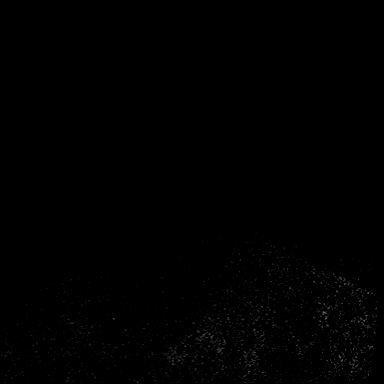

[Series 6: dynamic post 3 · axial · 1.3mm · 0.73mm/px · z∈[-119,+87]mm · 5 of 160 slices shown (1 of 2)]
[im 1/160]
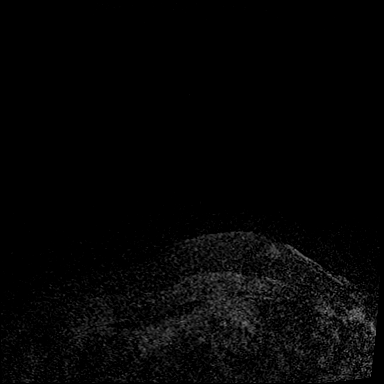
[im 40/160]
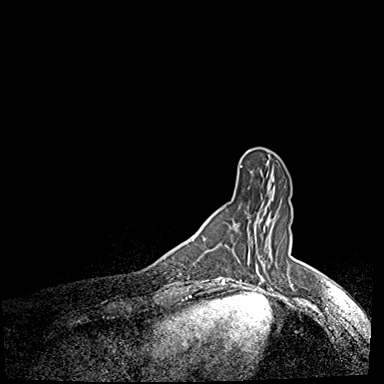
[im 80/160]
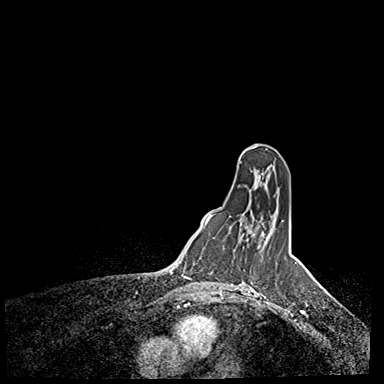
[im 120/160]
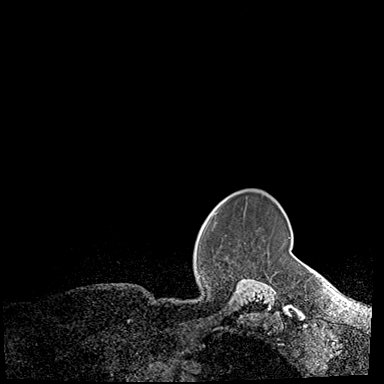
[im 160/160]
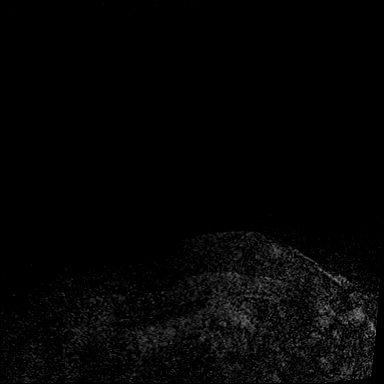

[Series 7: dynamic post 3 · axial · 1.3mm · 0.73mm/px · z∈[-119,+87]mm · 5 of 160 slices shown (2 of 2)]
[im 1/160]
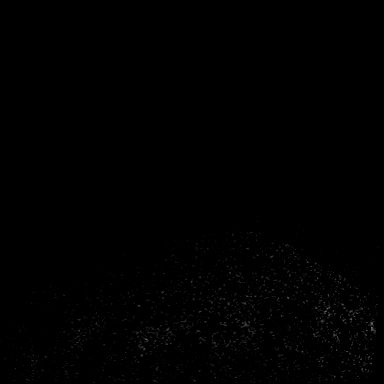
[im 40/160]
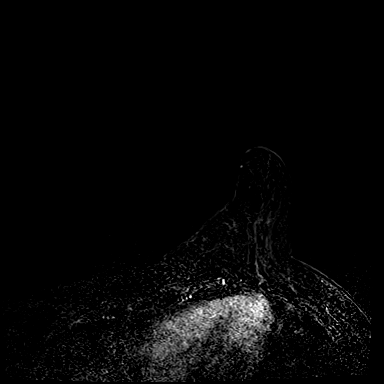
[im 80/160]
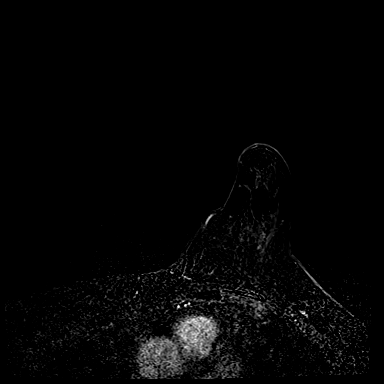
[im 120/160]
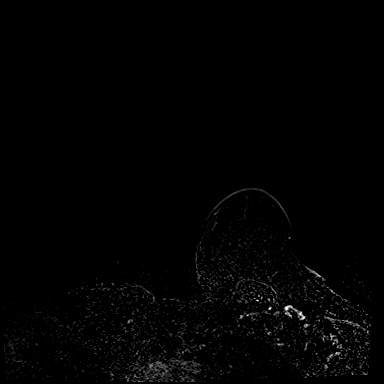
[im 160/160]
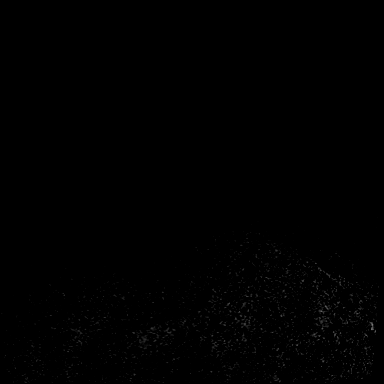

[Series 8: needle confirmation · axial · 1.3mm · 0.73mm/px · z∈[-119,-17]mm · 3 of 160 slices shown]
[im 1/160]
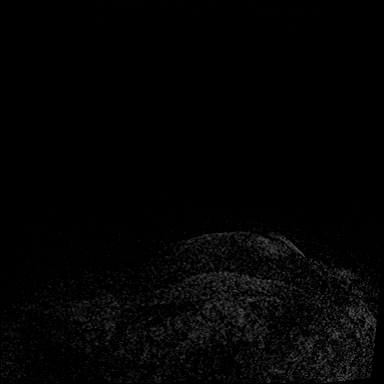
[im 40/160]
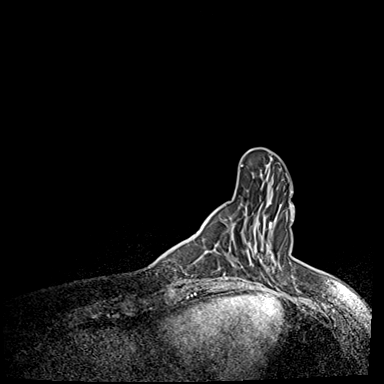
[im 80/160]
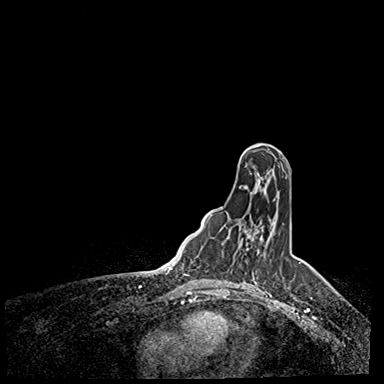

[31 of 48 positions shown; findings below may reference images not displayed]

FINDINGS: I met with the patient, and we discussed the procedure of MRI guided
biopsy, including risks, benefits, and alternatives. Specifically,
we discussed the risks of infection, bleeding, tissue injury, clip
migration, and inadequate sampling. Informed, written consent was
given. The usual time out protocol was performed immediately prior
to the procedure.

Site 1: Lesion quadrant: UPPER-OUTER QUADRANT LEFT breast, barbell
shaped clip

Using sterile technique and 1% lidocaine as local anesthetic, under
MRI guidance, a 9 gauge vacuum assisted device was used to perform
core needle biopsy of focal enhancement in the UPPER-OUTER QUADRANT
using a LATERAL to the approach.

At the conclusion of the procedure, tissue marker clip was deployed
into the biopsy cavity.

Site 2: Lesion quadrant: LOWER OUTER QUADRANT LEFT breast, anterior
to known malignancy, cylinder clip

Using sterile technique and 1% lidocaine as local anesthetic, under
MRI guidance, a 9 gauge vacuum assisted device was used to perform
core needle biopsy of enhancement in the anterior LOWER OUTER
QUADRANT of the LEFT breast using a LATERAL to MEDIAL approach.

At the conclusion of the procedure, cylinder-shaped tissue marker
clip was deployed into the biopsy cavity.

Follow-up  2-view mammogram was performed and dictated separately.
IMPRESSION: MRI guided biopsy of 2 areas of enhancement in the LEFT breast. No
apparent complications.

ADDENDUM:
Pathology revealed ADENOSIS WITH APOCRINE METAPLASIA AND
CALCIFICATIONS of the LEFT breast, upper outer, (barbell clip). This
was found to be concordant by Dr. NAZARETH.

Pathology revealed ADENOSIS WITH APOCRINE METAPLASIA AND
CALCIFICATIONS of the LEFT breast, lower outer, (cylinder clip).
This was found to be concordant by Dr. NAZARETH.

Pathology results were discussed with the patient by telephone. The
patient reported doing well after the biopsies with tenderness at
the sites. Post biopsy instructions and care were reviewed and
questions were answered. The patient was encouraged to call The
direct phone number was provided.

The patient has a recent diagnosis of LEFT breast cancer and should
follow her outlined treatment plan. The patient was instructed to
return for a bilateral breast MRI in 6 months, per protocol.

Pathology results were communicated to NAZARETH, CMA with
[REDACTED], via [REDACTED] message, on [DATE].

Pathology results reported by NAZARETH, RN on [DATE].

*** End of Addendum ***
FINDINGS: I met with the patient, and we discussed the procedure of MRI guided
biopsy, including risks, benefits, and alternatives. Specifically,
we discussed the risks of infection, bleeding, tissue injury, clip
migration, and inadequate sampling. Informed, written consent was
given. The usual time out protocol was performed immediately prior
to the procedure.

Site 1: Lesion quadrant: UPPER-OUTER QUADRANT LEFT breast, barbell
shaped clip

Using sterile technique and 1% lidocaine as local anesthetic, under
MRI guidance, a 9 gauge vacuum assisted device was used to perform
core needle biopsy of focal enhancement in the UPPER-OUTER QUADRANT
using a LATERAL to the approach.

At the conclusion of the procedure, tissue marker clip was deployed
into the biopsy cavity.

Site 2: Lesion quadrant: LOWER OUTER QUADRANT LEFT breast, anterior
to known malignancy, cylinder clip

Using sterile technique and 1% lidocaine as local anesthetic, under
MRI guidance, a 9 gauge vacuum assisted device was used to perform
core needle biopsy of enhancement in the anterior LOWER OUTER
QUADRANT of the LEFT breast using a LATERAL to MEDIAL approach.

At the conclusion of the procedure, cylinder-shaped tissue marker
clip was deployed into the biopsy cavity.

Follow-up  2-view mammogram was performed and dictated separately.
IMPRESSION: MRI guided biopsy of 2 areas of enhancement in the LEFT breast. No
apparent complications.

## 2020-09-24 MED ORDER — GADOBUTROL 1 MMOL/ML IV SOLN
7.0000 mL | Freq: Once | INTRAVENOUS | Status: AC | PRN
  Administered 2020-09-24: 7 mL via INTRAVENOUS

## 2020-09-24 NOTE — Telephone Encounter (Signed)
error 

## 2020-09-24 NOTE — Research (Signed)
[facility] PO 1754 - Customer service manager for the Discovery and Validation of Biomarkers for the Prediction, Diagnosis, and Management of Disease  This Nurse has reviewed this patient's inclusion and exclusion criteria and confirmed Amy Stephenson is eligible for study participation.  Patient will continue with enrollment.  Foye Spurling, BSN, RN Clinical Research Nurse 09/24/2020 8:51 AM

## 2020-09-27 ENCOUNTER — Inpatient Hospital Stay: Payer: BC Managed Care – PPO | Attending: Genetic Counselor

## 2020-09-27 ENCOUNTER — Inpatient Hospital Stay: Payer: BC Managed Care – PPO

## 2020-09-27 ENCOUNTER — Inpatient Hospital Stay: Payer: BC Managed Care – PPO | Admitting: Licensed Clinical Social Worker

## 2020-09-27 ENCOUNTER — Other Ambulatory Visit: Payer: Self-pay | Admitting: General Surgery

## 2020-09-27 ENCOUNTER — Other Ambulatory Visit: Payer: Self-pay

## 2020-09-27 ENCOUNTER — Telehealth: Payer: Self-pay | Admitting: Emergency Medicine

## 2020-09-27 DIAGNOSIS — C50512 Malignant neoplasm of lower-outer quadrant of left female breast: Secondary | ICD-10-CM

## 2020-09-27 DIAGNOSIS — Z170 Estrogen receptor positive status [ER+]: Secondary | ICD-10-CM

## 2020-09-27 LAB — GENETIC SCREENING ORDER

## 2020-09-27 NOTE — Telephone Encounter (Signed)
[facility] PO 1754 - Customer service manager for the Discovery and Validation of Biomarkers for the Prediction, Diagnosis, and Management of Disease  Informed patient that she is no longer eligible for this research study.  The research specimen collection planned for today will be canceled.  Patient verbalized understanding.  She was thanked for her time and willingness to participate.  Clabe Seal Clinical Research Coordinator I  09/27/20  9:45 AM

## 2020-09-29 ENCOUNTER — Encounter: Payer: Self-pay | Admitting: Licensed Clinical Social Worker

## 2020-09-29 ENCOUNTER — Inpatient Hospital Stay: Payer: BC Managed Care – PPO | Attending: Oncology | Admitting: Licensed Clinical Social Worker

## 2020-09-29 ENCOUNTER — Other Ambulatory Visit: Payer: Self-pay | Admitting: General Surgery

## 2020-09-29 DIAGNOSIS — Z800 Family history of malignant neoplasm of digestive organs: Secondary | ICD-10-CM | POA: Diagnosis not present

## 2020-09-29 DIAGNOSIS — Z8042 Family history of malignant neoplasm of prostate: Secondary | ICD-10-CM | POA: Diagnosis not present

## 2020-09-29 DIAGNOSIS — Z170 Estrogen receptor positive status [ER+]: Secondary | ICD-10-CM

## 2020-09-29 DIAGNOSIS — C50512 Malignant neoplasm of lower-outer quadrant of left female breast: Secondary | ICD-10-CM | POA: Diagnosis not present

## 2020-09-29 DIAGNOSIS — Z803 Family history of malignant neoplasm of breast: Secondary | ICD-10-CM

## 2020-09-29 NOTE — Progress Notes (Signed)
REFERRING PROVIDER: [name], MD [address] [place],  [place] [address]  PRIMARY PROVIDER:  Annamaria Helling, MD  PRIMARY REASON FOR VISIT:  1. Malignant neoplasm of lower-outer quadrant of left breast of female, estrogen receptor positive (HCC)   2. Family history of breast cancer   3. Family history of colon cancer   4. Family history of prostate cancer   5. Family history of stomach cancer      HISTORY OF PRESENT ILLNESS:   Ms. Holsapple, a 55 y.o. female, was seen for a [facility] cancer genetics consultation at the request of Dr. Dwain Sarna due to a personal and family history of breast cancer.  Ms. Gashi presents to clinic today to discuss the possibility of a hereditary predisposition to cancer, genetic testing, and to further clarify her future cancer risks, as well as potential cancer risks for family members.   In 2022, at the age of 67, Ms. Kenagy was diagnosed with invasive mammary carcinoma of the left breast, ER/PR+, Her2-. The treatment plan includes lumpectomy planned for 4/22, Oncotype to determine whether or not there is a role for chemotherapy, adjuvant radiation and adjuvant antiestrogen therapy. Ms. Ackerley does report she would consider changing her surgical decision based on results of genetic testing.  CANCER HISTORY:  Oncology History  Malignant neoplasm of lower-outer quadrant of left breast of female, estrogen receptor positive (HCC)  09/10/2020 Initial Diagnosis   Screening mammogram showed a possible left breast mass, palpable on exam. Diagnostic mammogram and US showed a 1.2cm mass at the 5 o'clock position and no abnormal axillary lymph nodes. Biopsy showed invasive mammary carcinoma, grade 1-2, HER-2 equivocal by IHC (2+), negative by FISH (ratio 1.36), ER+>95%, PR+ 90%, Ki67 5%. B   09/10/2020 Cancer Staging   Staging form: Breast, AJCC 8th Edition - Clinical stage from 09/10/2020: Stage IB (cT2, cN0, cM0, G2, ER+, PR+, HER2-) - Signed by  Loa Socks, NP on 09/22/2020 Stage prefix: Initial diagnosis Histologic grading system: 3 grade system   09/16/2020 Breast MRI   0.6cm mass in the upper outer quadrant, non-mass enhancement in the low outer quadrant, and the known malignancy spanning 2.1cm in the left breast with no evidence of right breast malignancy.      RISK FACTORS:  Menarche was at age 23.  First live birth at age 1.  Ovaries intact: yes.  Hysterectomy: no.  Menopausal status: postmenopausal.  HRT use: 0 years. Colonoscopy: yes; normal. Mammogram within the last year: yes. Number of breast biopsies: 3. Up to date with pelvic exams: yes. Any excessive radiation exposure in the past: no  Past Medical History:  Diagnosis Date  . Family history of breast cancer   . Family history of colon cancer   . Family history of prostate cancer   . Family history of stomach cancer      Social History   Socioeconomic History  . Marital status: Married    Spouse name: Not on file  . Number of children: Not on file  . Years of education: Not on file  . Highest education level: Not on file  Occupational History  . Not on file  Tobacco Use  . Smoking status: Never Smoker  . Smokeless tobacco: Never Used  Substance and Sexual Activity  . Alcohol use: No  . Drug use: Not on file  . Sexual activity: Not on file  Other Topics Concern  . Not on file  Social History Narrative  . Not on file  Social Determinants of Health   Financial Resource Strain: Not on file  Food Insecurity: Not on file  Transportation Needs: Not on file  Physical Activity: Not on file  Stress: Not on file  Social Connections: Not on file     FAMILY HISTORY:  We obtained a detailed, 4-generation family history.  Significant diagnoses are listed below: Family History  Problem Relation Age of Onset  . Breast cancer Mother 60  . Colon cancer Father 53  . Breast cancer Maternal Aunt        dx 34s, d. 57  . Esophageal  cancer Paternal Uncle        dx 31s  . Stomach cancer Paternal Grandmother 52  . Prostate cancer Paternal Grandfather 45  . Breast cancer Cousin        dx 22s   Ms. Urizar has a son and a daughter, no cancer history. She has 1 sister, no cancers.  Ms. Gosse mother had breast cancer at 6 and died at 76, she also had dementia. Patient's mother had many maternal half siblings, mostly half brothers, but one half sister had breast cancer in her 6s and died at 82 of cancer. A maternal cousin, an uncle's daughter, also had breast cancer, in her 65s. Maternal grandmother died at 82 and grandfather passed at 29.  Ms. Flaharty father had colon cancer at 31 and died at 48. Patient had 2 paternal uncles. One had esophageal cancer in his 56s and died in his 92s. Paternal grandmother had stomach cancer at 13 and passed of this, grandfather had prostate cancer at 44 and died at 53.   Ms. Vokes is unaware of previous family history of genetic testing for hereditary cancer risks. Patient's maternal ancestors are of Native American descent, and paternal ancestors are of English descent. There is no reported Ashkenazi Jewish ancestry. There is no known consanguinity.      GENETIC COUNSELING ASSESSMENT: Ms. Peach is a 55 y.o. female with a personal history of breast cancer and family history of breast cancer which is somewhat suggestive of a hereditary cancer syndrome and predisposition to cancer. We, therefore, discussed and recommended the following at today's visit.   DISCUSSION: We discussed that approximately 5-10% of breast cancer is hereditary  Most cases of hereditary breast cancer are associated with BRCA1/BRCA2 genes, although there are other genes associated with hereditary breast cancer as well. There are also other genes associated with other cancers in her family such as colon, prostate and stomach. Cancer types and risks are gene specific.   We discussed that testing is beneficial for  several reasons including surgical decision-making for breast cancer, knowing about other cancer risks, identifying potential screening and risk-reduction options that may be appropriate, and to understand if other family members could be at risk for cancer and allow them to undergo genetic testing.   We reviewed the characteristics, features and inheritance patterns of hereditary cancer syndromes. We also discussed genetic testing, including the appropriate family members to test, the process of testing, insurance coverage and turn-around-time for results. We discussed the implications of a negative, positive and/or variant of uncertain significant result. In order to get genetic test results in a timely manner so that Ms. Ventress can use these genetic test results for surgical decisions, we recommended Ms. Vanderwoude pursue genetic testing for the [facility] Breast Cancer STAT panel. Once complete, we recommend Ms. Bowland pursue reflex genetic testing to the [facility] Multi-Cancer gene panel.   The STAT Breast cancer panel offered  by [facility] includes sequencing and rearrangement analysis for the following 9 genes:  ATM, BRCA1, BRCA2, CDH1, CHEK2, PALB2, PTEN, STK11 and TP53.    The Multi-Cancer Panel offered by [facility] includes sequencing and/or deletion duplication testing of the following 84 genes: AIP, ALK, APC, ATM, AXIN2,BAP1,  BARD1, BLM, BMPR1A, BRCA1, BRCA2, BRIP1, CASR, CDC73, CDH1, CDK4, CDKN1B, CDKN1C, CDKN2A (p14ARF), CDKN2A (p16INK4a), CEBPA, CHEK2, CTNNA1, DICER1, DIS3L2, EGFR (c.2369C>T, p.Thr790Met variant only), EPCAM (Deletion/duplication testing only), FH, FLCN, GATA2, GPC3, GREM1 (Promoter region deletion/duplication testing only), HOXB13 (c.251G>A, p.Gly84Glu), HRAS, KIT, MAX, MEN1, MET, MITF (c.952G>A, p.Glu318Lys variant only), MLH1, MSH2, MSH3, MSH6, MUTYH, NBN, NF1, NF2, NTHL1, PALB2, PDGFRA, PHOX2B, PMS2, POLD1, POLE, POT1, PRKAR1A, PTCH1, PTEN, RAD50, RAD51C, RAD51D, RB1, RECQL4, RET,  RUNX1, SDHAF2, SDHA (sequence changes only), SDHB, SDHC, SDHD, SMAD4, SMARCA4, SMARCB1, SMARCE1, STK11, SUFU, TERC, TERT, TMEM127, TP53, TSC1, TSC2, VHL, WRN and WT1.  Based on Ms. Perra's personal and family history of cancer, she meets medical criteria for genetic testing. Despite that she meets criteria, she may still have an out of pocket cost.   PLAN: After considering the risks, benefits, and limitations, Ms. Kochan provided informed consent to pursue genetic testing and the blood sample was sent to [facility] for analysis of the Breast Cancer STAT Panel + Multi-Cancer Panel. Results should be available within approximately 1 weeks' time, at which point they will be disclosed by telephone to Ms. Pandit, as will any additional recommendations warranted by these results. Ms. Freese will receive a summary of her genetic counseling visit and a copy of her results once available. This information will also be available in Epic.   Ms. Figg questions were answered to her satisfaction today. Our contact information was provided should additional questions or concerns arise. Thank you for the referral and allowing [place] to share in the care of your patient.   Faith Rogue, MS, [facility] Genetic Counselor [email or web address] Phone: [phone]  The patient was seen for a total of 30 minutes in face-to-face genetic counseling.  Dr. Grayland Ormond was available for discussion regarding this case.   _______________________________________________________________________ For Office Staff:  Number of people involved in session: 2 Was an Intern/ student involved with case: yes

## 2020-09-30 ENCOUNTER — Telehealth: Payer: Self-pay | Admitting: *Deleted

## 2020-09-30 ENCOUNTER — Encounter: Payer: Self-pay | Admitting: *Deleted

## 2020-09-30 NOTE — Telephone Encounter (Signed)
Spoke to pt regarding navigation resources and provided contact information. Denies questions or concerns regarding dx or treatment care plan. Scheduled and confirmed post op with Dr. Lindi Adie on 5/2 at 3:30pm Encourage pt to call with needs. Received verbal understanding.

## 2020-10-04 ENCOUNTER — Ambulatory Visit: Payer: Self-pay | Admitting: Licensed Clinical Social Worker

## 2020-10-04 ENCOUNTER — Encounter: Payer: BC Managed Care – PPO | Admitting: Genetic Counselor

## 2020-10-04 ENCOUNTER — Telehealth: Payer: Self-pay | Admitting: Licensed Clinical Social Worker

## 2020-10-04 ENCOUNTER — Encounter: Payer: Self-pay | Admitting: Licensed Clinical Social Worker

## 2020-10-04 DIAGNOSIS — Z1379 Encounter for other screening for genetic and chromosomal anomalies: Secondary | ICD-10-CM

## 2020-10-04 DIAGNOSIS — Z800 Family history of malignant neoplasm of digestive organs: Secondary | ICD-10-CM

## 2020-10-04 DIAGNOSIS — Z8042 Family history of malignant neoplasm of prostate: Secondary | ICD-10-CM

## 2020-10-04 DIAGNOSIS — C50512 Malignant neoplasm of lower-outer quadrant of left female breast: Secondary | ICD-10-CM

## 2020-10-04 DIAGNOSIS — Z803 Family history of malignant neoplasm of breast: Secondary | ICD-10-CM

## 2020-10-04 NOTE — Progress Notes (Signed)
HPI:  Ms. Bougher was previously seen in the [place] clinic due to a personal and family history of cancer and concerns regarding a hereditary predisposition to cancer. Please refer to our prior cancer genetics clinic note for more information regarding our discussion, assessment and recommendations, at the time. Ms. Machnik recent genetic test results were disclosed to her, as were recommendations warranted by these results. These results and recommendations are discussed in more detail below.  CANCER HISTORY:  Oncology History  Malignant neoplasm of lower-outer quadrant of left breast of female, estrogen receptor positive ([place])  09/10/2020 Initial Diagnosis   Screening mammogram showed a possible left breast mass, palpable on exam. Diagnostic mammogram and US showed a 1.2cm mass at the 5 o'clock position and no abnormal axillary lymph nodes. Biopsy showed invasive mammary carcinoma, grade 1-2, HER-2 equivocal by IHC (2+), negative by FISH (ratio 1.36), ER+>95%, PR+ 90%, Ki67 5%. B   09/10/2020 Cancer Staging   Staging form: Breast, AJCC 8th Edition - Clinical stage from 09/10/2020: Stage IB (cT2, cN0, cM0, G2, ER+, PR+, HER2-) - Signed by Gardenia Phlegm, NP on 09/22/2020 Stage prefix: Initial diagnosis Histologic grading system: 3 grade system   09/16/2020 Breast MRI   0.6cm mass in the upper outer quadrant, non-mass enhancement in the low outer quadrant, and the known malignancy spanning 2.1cm in the left breast with no evidence of right breast malignancy.    Genetic Testing   Negative genetic testing. No pathogenic variants identified on the [facility] STAT+Multi-Cancer Panel. The report date is 10/04/2020.  The STAT Breast cancer panel offered by [facility] includes sequencing and rearrangement analysis for the following 9 genes:  ATM, BRCA1, BRCA2, CDH1, CHEK2, PALB2, PTEN, STK11 and TP53.    The Multi-Cancer Panel + RNA offered by [facility] includes sequencing and/or  deletion duplication testing of the following 84 genes: AIP, ALK, APC, ATM, AXIN2,BAP1,  BARD1, BLM, BMPR1A, BRCA1, BRCA2, BRIP1, CASR, CDC73, CDH1, CDK4, CDKN1B, CDKN1C, CDKN2A (p14ARF), CDKN2A (p16INK4a), CEBPA, CHEK2, CTNNA1, DICER1, DIS3L2, EGFR (c.2369C>T, p.Thr790Met variant only), EPCAM (Deletion/duplication testing only), FH, FLCN, GATA2, GPC3, GREM1 (Promoter region deletion/duplication testing only), HOXB13 (c.251G>A, p.Gly84Glu), HRAS, KIT, MAX, MEN1, MET, MITF (c.952G>A, p.Glu318Lys variant only), MLH1, MSH2, MSH3, MSH6, MUTYH, NBN, NF1, NF2, NTHL1, PALB2, PDGFRA, PHOX2B, PMS2, POLD1, POLE, POT1, PRKAR1A, PTCH1, PTEN, RAD50, RAD51C, RAD51D, RB1, RECQL4, RET, RUNX1, SDHAF2, SDHA (sequence changes only), SDHB, SDHC, SDHD, SMAD4, SMARCA4, SMARCB1, SMARCE1, STK11, SUFU, TERC, TERT, TMEM127, TP53, TSC1, TSC2, VHL, WRN and WT1.     FAMILY HISTORY:  We obtained a detailed, 4-generation family history.  Significant diagnoses are listed below: Family History  Problem Relation Age of Onset  . Breast cancer Mother 2  . Colon cancer Father 82  . Breast cancer Maternal Aunt        dx 58s, d. 66  . Esophageal cancer Paternal Uncle        dx 1s  . Stomach cancer Paternal Grandmother 58  . Prostate cancer Paternal Grandfather 19  . Breast cancer Cousin        dx 17s    Ms. Perdew has a son and a daughter, no cancer history. She has 1 sister, no cancers.  Ms. Bossman mother had breast cancer at 43 and died at 71, she also had dementia. Patient's mother had many maternal half siblings, mostly half brothers, but one half sister had breast cancer in her 65s and died at 48 of cancer. A maternal cousin, an uncle's daughter, also had  breast cancer, in her 36s. Maternal grandmother died at 35 and grandfather passed at 75.  Ms. Duross father had colon cancer at 56 and died at 61. Patient had 2 paternal uncles. One had esophageal cancer in his 58s and died in his 25s. Paternal grandmother had  stomach cancer at 36 and passed of this, grandfather had prostate cancer at 73 and died at 74.   Ms. Raczynski is unaware of previous family history of genetic testing for hereditary cancer risks. Patient's maternal ancestors are of Native American descent, and paternal ancestors are of English descent. There is no reported Ashkenazi Jewish ancestry. There is no known consanguinity.     GENETIC TEST RESULTS: Genetic testing reported out on 10/04/2020 through the [facility] Breast Cancer STAT+ Multi- cancer panel found no pathogenic mutations.   The STAT Breast cancer panel offered by [facility] includes sequencing and rearrangement analysis for the following 9 genes:  ATM, BRCA1, BRCA2, CDH1, CHEK2, PALB2, PTEN, STK11 and TP53.   The Multi-Cancer Panel + RNA offered by [facility] includes sequencing and/or deletion duplication testing of the following 84 genes: AIP, ALK, APC, ATM, AXIN2,BAP1,  BARD1, BLM, BMPR1A, BRCA1, BRCA2, BRIP1, CASR, CDC73, CDH1, CDK4, CDKN1B, CDKN1C, CDKN2A (p14ARF), CDKN2A (p16INK4a), CEBPA, CHEK2, CTNNA1, DICER1, DIS3L2, EGFR (c.2369C>T, p.Thr790Met variant only), EPCAM (Deletion/duplication testing only), FH, FLCN, GATA2, GPC3, GREM1 (Promoter region deletion/duplication testing only), HOXB13 (c.251G>A, p.Gly84Glu), HRAS, KIT, MAX, MEN1, MET, MITF (c.952G>A, p.Glu318Lys variant only), MLH1, MSH2, MSH3, MSH6, MUTYH, NBN, NF1, NF2, NTHL1, PALB2, PDGFRA, PHOX2B, PMS2, POLD1, POLE, POT1, PRKAR1A, PTCH1, PTEN, RAD50, RAD51C, RAD51D, RB1, RECQL4, RET, RUNX1, SDHAF2, SDHA (sequence changes only), SDHB, SDHC, SDHD, SMAD4, SMARCA4, SMARCB1, SMARCE1, STK11, SUFU, TERC, TERT, TMEM127, TP53, TSC1, TSC2, VHL, WRN and WT1.  The test report has been scanned into EPIC and is located under the Molecular Pathology section of the Results Review tab.  A portion of the result report is included below for reference.     We discussed with Ms. Mccaffrey that because current genetic testing is not perfect,  it is possible there may be a gene mutation in one of these genes that current testing cannot detect, but that chance is small.  We also discussed, that there could be another gene that has not yet been discovered, or that we have not yet tested, that is responsible for the cancer diagnoses in the family. It is also possible there is a hereditary cause for the cancer in the family that Ms. Ricketson did not inherit and therefore was not identified in her testing.  Therefore, it is important to remain in touch with cancer genetics in the future so that we can continue to offer Ms. Yaun the most up to date genetic testing.   ADDITIONAL GENETIC TESTING: We discussed with Ms. Waldorf that her genetic testing was fairly extensive.  If there are genes identified to increase cancer risk that can be analyzed in the future, we would be happy to discuss and coordinate this testing at that time.    CANCER SCREENING RECOMMENDATIONS: Ms. Leisinger test result is considered negative (normal).  This means that we have not identified a hereditary cause for her  personal and family history of cancer at this time. Most cancers happen by chance and this negative test suggests that her cancer may fall into this category.    While reassuring, this does not definitively rule out a hereditary predisposition to cancer. It is still possible that there could be genetic mutations that are undetectable by current  technology. There could be genetic mutations in genes that have not been tested or identified to increase cancer risk.  Therefore, it is recommended she continue to follow the cancer management and screening guidelines provided by her oncology and primary healthcare provider.   An individual's cancer risk and medical management are not determined by genetic test results alone. Overall cancer risk assessment incorporates additional factors, including personal medical history, family history, and any available genetic  information that may result in a personalized plan for cancer prevention and surveillance.  RECOMMENDATIONS FOR FAMILY MEMBERS:  Relatives in this family might be at some increased risk of developing cancer, over the general population risk, simply due to the family history of cancer.  We recommended female relatives in this family have a yearly mammogram beginning at age 68, or 17 years younger than the earliest onset of cancer, an annual clinical breast exam, and perform monthly breast self-exams. Female relatives in this family should also have a gynecological exam as recommended by their primary provider.  All family members should be referred for colonoscopy starting at age 70.   It is also possible there is a hereditary cause for the cancer in Ms. Ewings's family that she did not inherit and therefore was not identified in her.  Based on Ms. Conteh's family history, we recommended maternal relatives have genetic counseling and testing. Ms. Siddall will let us know if we can be of any assistance in coordinating genetic counseling and/or testing for these family members.  FOLLOW-UP: Lastly, we discussed with Ms. Urton that cancer genetics is a rapidly advancing field and it is possible that new genetic tests will be appropriate for her and/or her family members in the future. We encouraged her to remain in contact with cancer genetics on an annual basis so we can update her personal and family histories and let her know of advances in cancer genetics that may benefit this family.   Our contact number was provided. Ms. Rance questions were answered to her satisfaction, and she knows she is welcome to call us at anytime with additional questions or concerns.   Faith Rogue, MS, [facility] Genetic Counselor [email or web address] Phone: [phone]

## 2020-10-04 NOTE — Telephone Encounter (Signed)
Revealed negative genetic testing. This normal result is reassuring and indicates that it is unlikely Amy Stephenson's cancer is due to a hereditary cause.  It is unlikely that there is an increased risk of another cancer due to a mutation in one of these genes.  However, genetic testing is not perfect, and cannot definitively rule out a hereditary cause.  It will be important for her to keep in contact with genetics to learn if any additional testing may be needed in the future.

## 2020-10-06 ENCOUNTER — Other Ambulatory Visit: Payer: Self-pay

## 2020-10-06 ENCOUNTER — Ambulatory Visit: Payer: BC Managed Care – PPO | Attending: General Surgery | Admitting: Physical Therapy

## 2020-10-06 ENCOUNTER — Encounter (HOSPITAL_BASED_OUTPATIENT_CLINIC_OR_DEPARTMENT_OTHER)
Admission: RE | Admit: 2020-10-06 | Discharge: 2020-10-06 | Disposition: A | Discharge status: Home / Self Care | Payer: BC Managed Care – PPO | Source: Ambulatory Visit | Attending: General Surgery | Admitting: General Surgery

## 2020-10-06 ENCOUNTER — Encounter (HOSPITAL_BASED_OUTPATIENT_CLINIC_OR_DEPARTMENT_OTHER): Payer: Self-pay | Admitting: General Surgery

## 2020-10-06 DIAGNOSIS — C50512 Malignant neoplasm of lower-outer quadrant of left female breast: Secondary | ICD-10-CM | POA: Insufficient documentation

## 2020-10-06 DIAGNOSIS — R293 Abnormal posture: Secondary | ICD-10-CM | POA: Insufficient documentation

## 2020-10-06 DIAGNOSIS — Z01812 Encounter for preprocedural laboratory examination: Secondary | ICD-10-CM | POA: Insufficient documentation

## 2020-10-06 DIAGNOSIS — Z170 Estrogen receptor positive status [ER+]: Secondary | ICD-10-CM | POA: Diagnosis present

## 2020-10-06 LAB — BASIC METABOLIC PANEL
Anion gap: 8 (ref 5–15)
BUN: 18 mg/dL (ref 6–20)
CO2: 30 mmol/L (ref 22–32)
Calcium: 9.7 mg/dL (ref 8.9–10.3)
Chloride: 100 mmol/L (ref 98–111)
Creatinine, Ser: 0.66 mg/dL (ref 0.44–1.00)
GFR, Estimated: >60 mL/min (ref >60)
Glucose, Bld: 150 mg/dL — ABNORMAL HIGH (ref 70–99)
Potassium: 4 mmol/L (ref 3.5–5.1)
Sodium: 138 mmol/L (ref 135–145)

## 2020-10-06 NOTE — Progress Notes (Signed)
      Enhanced Recovery after Surgery for Orthopedics Enhanced Recovery after Surgery is a protocol used to improve the stress on your body and your recovery after surgery.  Patient Instructions  . The night before surgery:  o No food after midnight. ONLY clear liquids after midnight  . The day of surgery (if you do NOT have diabetes):  o Drink ONE (1) Pre-Surgery Clear Ensure as directed.   o This drink was given to you during your hospital  pre-op appointment visit. o The pre-op nurse will instruct you on the time to drink the  Pre-Surgery Ensure depending on your surgery time. o Finish the drink at the designated time by the pre-op nurse.  o Nothing else to drink after completing the  Pre-Surgery Clear Ensure.  . The day of surgery (if you have diabetes): o Drink ONE (1) Gatorade 2 (G2) as directed. o This drink was given to you during your hospital  pre-op appointment visit.  o The pre-op nurse will instruct you on the time to drink the   Gatorade 2 (G2) depending on your surgery time. o Color of the Gatorade may vary. Red is not allowed. o Nothing else to drink after completing the  Gatorade 2 (G2).         If you have questions, please contact your surgeon's office.   Surgical soap given, patient verbalizes understanding.

## 2020-10-06 NOTE — Patient Instructions (Signed)

## 2020-10-06 NOTE — Therapy (Signed)
[place], [place], [address] Phone: [phone]   Fax:  [phone]  Physical Therapy Evaluation  Patient Details  Name: Amy Stephenson MRN: [id number] Date of Birth: 1965/11/04 Referring Provider (PT): Dr. Donne Hazel   Encounter Date: 10/06/2020   PT End of Session - 10/06/20 1554    Visit Number 1    Number of Visits 2    Date for PT Re-Evaluation 11/23/20    PT Start Time 1500    PT Stop Time 1545    PT Time Calculation (min) 45 min    Activity Tolerance Patient tolerated treatment well    Behavior During Therapy [facility] for tasks assessed/performed           Past Medical History:  Diagnosis Date  . Diabetes mellitus without complication ([place])   . Family history of breast cancer   . Family history of colon cancer   . Family history of prostate cancer   . Family history of stomach cancer     Past Surgical History:  Procedure Laterality Date  . CHOLECYSTECTOMY    . COLONOSCOPY      There were no vitals filed for this visit.    Subjective Assessment - 10/06/20 1505    Subjective Pt is here for preop measurements.  Surgery will be April 22 and expects to have lumpectomy and sentinel node biopsy    Pertinent History left breast cancer found on mammogram 08/2020   She does not know if she will need to have radiation or chemo .  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 No/denies              [facility] PT Assessment - 10/06/20 0001      Assessment   Medical Diagnosis left breast cacner    Referring Provider (PT) Dr. Donne Hazel    Onset Date/Surgical Date 09/16/20   diagnosis   Hand Dominance Right      Precautions   Precautions None      Restrictions   Weight Bearing Restrictions No      Balance  Screen   Has the patient fallen in the past 6 months No    Has the patient had a decrease in activity level because of a fear of falling?  No    Is the patient reluctant to leave their home because of a fear of falling?  No      Home Ecologist residence    Living Arrangements Spouse/significant other      Prior Function   Level of [place] Unemployed    Leisure walks the dog every day, loves to garden and is active and busy      Cognition   Overall Cognitive Status Within Functional Limits for tasks assessed      Observation/Other Assessments   Observations pt walks in withought device and is mobile, easily raises arms      Sensation   Light Touch Appears Intact      Coordination   Gross Motor Movements are Fluid and Coordinated Yes      Posture/Postural Control   Posture/Postural Control Postural limitations    Postural Limitations Forward head      ROM / Strength  AROM / PROM / Strength AROM;Strength      AROM   Overall AROM  Within functional limits for tasks performed    AROM Assessment Site Shoulder    Right/Left Shoulder Right;Left    Right Shoulder Flexion 175 Degrees    Right Shoulder ABduction 170 Degrees    Left Shoulder Flexion 175 Degrees    Left Shoulder ABduction 170 Degrees      Strength   Overall Strength Within functional limits for tasks performed             LYMPHEDEMA/ONCOLOGY QUESTIONNAIRE - 10/06/20 0001      Type   Cancer Type left breast      Lymphedema Assessments   Lymphedema Assessments Upper extremities      Right Upper Extremity Lymphedema   10 cm Proximal to Olecranon Process 26.1 cm    Olecranon Process 23.5 cm    10 cm Proximal to Ulnar Styloid Process 21 cm    Just Proximal to Ulnar Styloid Process 15.5 cm    Across Hand at [employer] 18 cm    At [place] of 2nd Digit 6 cm      Left Upper Extremity Lymphedema   10 cm Proximal to Olecranon Process 26.1 cm     Olecranon Process 23 cm    10 cm Proximal to Ulnar Styloid Process 21.5 cm    Just Proximal to Ulnar Styloid Process 15.5 cm    Across Hand at [employer] 18 cm    At [place] of 2nd Digit 5.9 cm           L-DEX FLOWSHEETS - 10/06/20 1500      L-DEX LYMPHEDEMA SCREENING   Measurement Type Unilateral    L-DEX MEASUREMENT EXTREMITY Upper Extremity    POSITION  Standing    DOMINANT SIDE Right    At Risk Side Left    BASELINE SCORE (UNILATERAL) 0.1                  Objective measurements completed on examination: See above findings.                     Breast Clinic Goals - 10/06/20 1558      Patient will be able to verbalize understanding of pertinent lymphedema risk reduction practices relevant to her diagnosis specifically related to skin care.   Time 4    Period Weeks    Status New      Patient will be able to return demonstrate and/or verbalize understanding of the post-op home exercise program related to regaining shoulder range of motion.   Time 1    Period Days    Status Achieved      Patient will be able to verbalize understanding of the importance of attending the postoperative After Breast Cancer Class for further lymphedema risk reduction education and therapeutic exercise.   Time 1    Period Days    Status Achieved                 Plan - 10/06/20 1559    Clinical Impression Statement Amy Stephenson comes to PT for a preassessment visit priot ro lumpectomy and sentinel node biopsy scheduled for April 22.Baseline measurements and SOZO were taken.  She verbalized good knowledge about what to expect and demonstrated post op exercises. She has had past history of bilateral frozen shoulders that has recovered and is famliar with shoudler exericse. She was scheduled for post op follow up  and SOZO screen.    Personal Factors and Comorbidities Comorbidity 2    Comorbidities left breast cancer, DM    Stability/Clinical Decision Making  Stable/Uncomplicated    Clinical Decision Making Low    Rehab Potential Excellent    PT Frequency Other (comment)   post op visit in 3-4 weeks after surgery   PT Treatment/Interventions ADLs/Self Care Home Management;Therapeutic exercise;Therapeutic activities;Patient/family education    PT Next Visit Plan reassess and recert as needed    Consulted and Agree with Plan of Care Patient          Patient will follow up at outpatient cancer rehab 3-4 weeks following surgery.  If the patient requires physical therapy at that time, a specific plan will be dictated and sent to the referring physician for approval. The patient was educated today on appropriate basic range of motion exercises to begin post operatively and the importance of attending the After Breast Cancer class following surgery.  Patient was educated today on lymphedema risk reduction practices as it pertains to recommendations that will benefit the patient immediately following surgery.  She verbalized good understanding.   Patient will benefit from skilled therapeutic intervention in order to improve the following deficits and impairments:    The patient was assessed using the L-Dex machine today to produce a lymphedema index baseline score. The patient will be reassessed on a regular basis (typically every 3 months) to obtain new L-Dex scores. If the score is > 6.5 points away from his/her baseline score indicating onset of subclinical lymphedema, it will be recommended to wear a compression garment for 4 weeks, 12 hours per day and then be reassessed. If the score continues to be > 6.5 points from baseline at reassessment, we will initiate lymphedema treatment. Assessing in this manner has a 95% rate of preventing clinically significant lymphedema. Visit Diagnosis: Malignant neoplasm of lower-outer quadrant of left breast of female, estrogen receptor positive ([place])  Abnormal posture     Problem List Patient Active Problem List    Diagnosis Date Noted  . Genetic testing 10/04/2020  . Family history of breast cancer   . Family history of colon cancer   . Family history of prostate cancer   . Family history of stomach cancer   . Malignant neoplasm of lower-outer quadrant of left breast of female, estrogen receptor positive ([place]) 09/22/2020  . Anxiety 09/07/2015   Donato Heinz. Owens Shark PT  Norwood Levo 10/06/2020, 4:11 PM  [employer] [place], [place], [address] Phone: [phone]   Fax:  [phone]  Name: Amy Stephenson MRN: [id number] Date of Birth: [birth year]

## 2020-10-12 ENCOUNTER — Other Ambulatory Visit
Admission: RE | Admit: 2020-10-12 | Discharge: 2020-10-12 | Disposition: A | Discharge status: Home / Self Care | Payer: BC Managed Care – PPO | Source: Ambulatory Visit | Attending: General Surgery | Admitting: General Surgery

## 2020-10-12 ENCOUNTER — Other Ambulatory Visit: Payer: Self-pay

## 2020-10-12 DIAGNOSIS — Z01812 Encounter for preprocedural laboratory examination: Secondary | ICD-10-CM | POA: Diagnosis not present

## 2020-10-12 DIAGNOSIS — Z20822 Contact with and (suspected) exposure to covid-19: Secondary | ICD-10-CM | POA: Diagnosis not present

## 2020-10-12 LAB — SARS CORONAVIRUS 2 (TAT 6-24 HRS): SARS Coronavirus 2: NEGATIVE

## 2020-10-14 ENCOUNTER — Other Ambulatory Visit: Payer: Self-pay | Admitting: General Surgery

## 2020-10-14 ENCOUNTER — Ambulatory Visit
Admission: RE | Admit: 2020-10-14 | Discharge: 2020-10-14 | Disposition: A | Discharge status: Home / Self Care | Payer: BC Managed Care – PPO | Source: Ambulatory Visit | Attending: General Surgery | Admitting: General Surgery

## 2020-10-14 ENCOUNTER — Other Ambulatory Visit: Payer: Self-pay

## 2020-10-14 ENCOUNTER — Ambulatory Visit
Admission: RE | Admit: 2020-10-14 | Discharge: 2020-10-14 | Disposition: A | Discharge status: Home / Self Care | Payer: BC Managed Care – PPO | Source: Ambulatory Visit

## 2020-10-14 DIAGNOSIS — Z170 Estrogen receptor positive status [ER+]: Secondary | ICD-10-CM

## 2020-10-14 DIAGNOSIS — C50512 Malignant neoplasm of lower-outer quadrant of left female breast: Secondary | ICD-10-CM

## 2020-10-14 IMAGING — US US PLC BREAST LOC DEV 1ST LESION INC US GUIDE*L*
1 series · 2 of 2 positions shown · non-contrast
Comparison: Previous exam(s).

CLINICAL DATA: 54-year-old female for radioactive seed localization
of LEFT breast cancer prior to lumpectomy.

EXAM:
ULTRASOUND GUIDED RADIOACTIVE SEED LOCALIZATION OF THE LEFT BREAST

[Series 1: us plc breast loc dev 1st lesion inc us guide*left · 0.05mm/px · 2 of 2 slices shown]
[im 1/2]
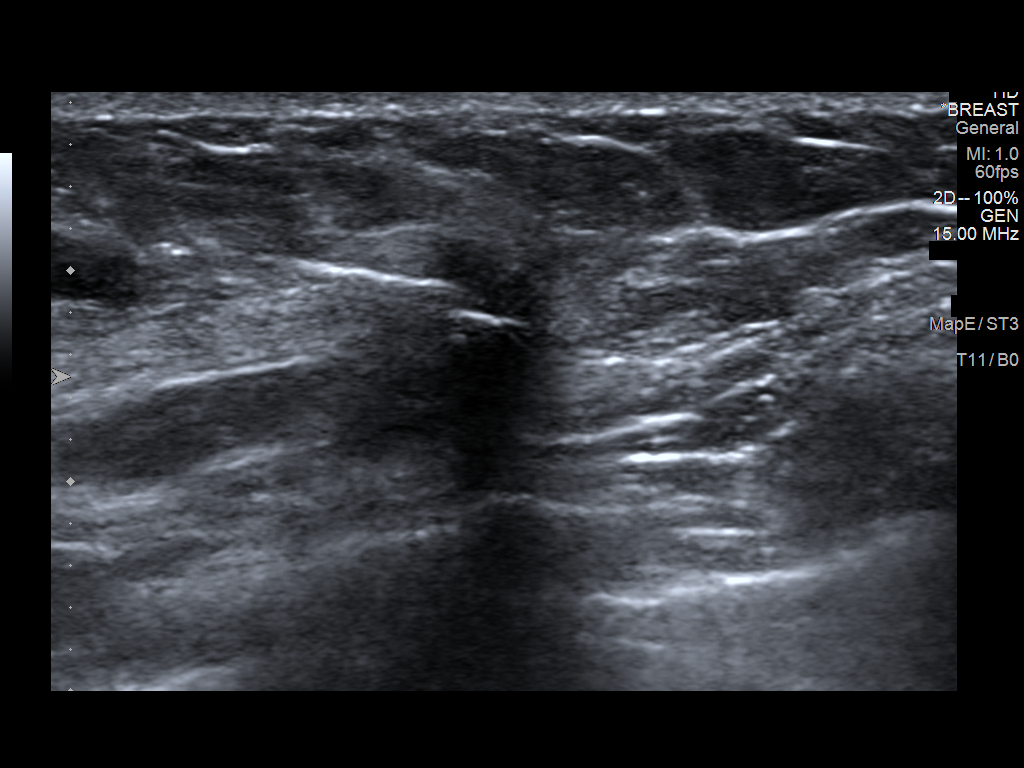
[im 2/2]
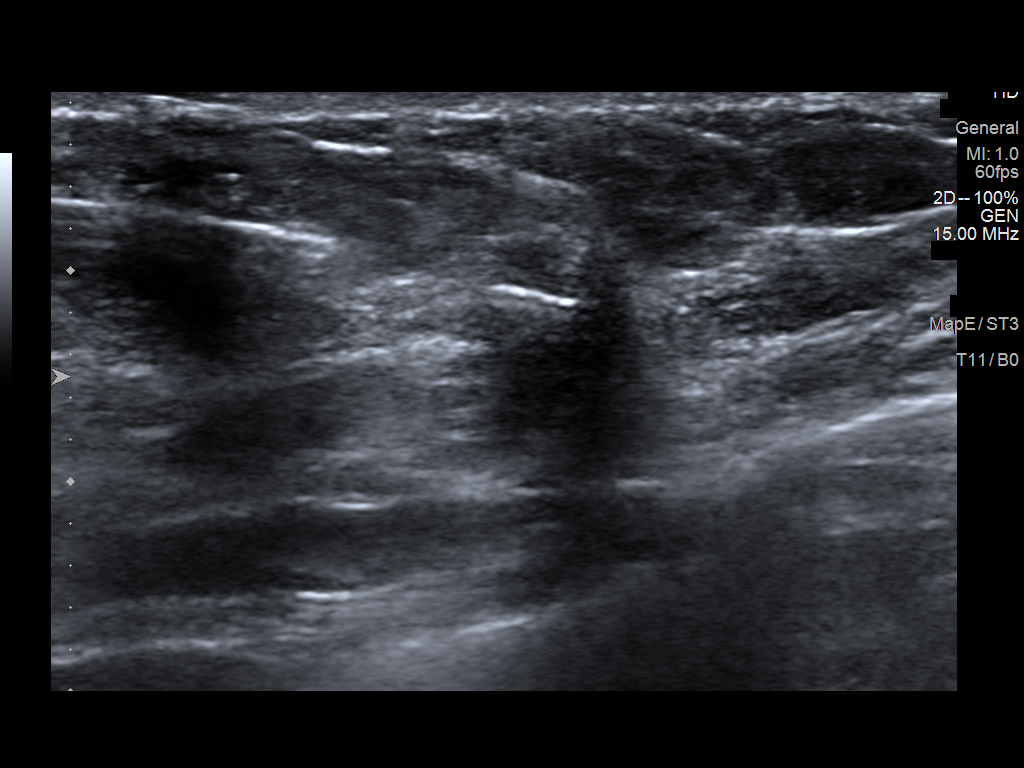

[2 of 2 positions shown; findings below may reference images not displayed]

FINDINGS: Patient presents for radioactive seed localization prior to LEFT
lumpectomy. I met with the patient and we discussed the procedure of
seed localization including benefits and alternatives. We discussed
the high likelihood of a successful procedure. We discussed the
risks of the procedure including infection, bleeding, tissue injury
and further surgery. We discussed the low dose of radioactivity
involved in the procedure. Informed, written consent was given.

The usual time-out protocol was performed immediately prior to the
procedure.

Using ultrasound guidance, sterile technique, 1% lidocaine and an
[Z5] radioactive seed, the 1.2 cm mass at the 5 o'clock position of
the LEFT breast 4 cm from the nipple was localized using a MEDIAL
approach. The follow-up mammogram images confirm the seed in the
expected location and were marked for Dr. NAHIM.

Follow-up survey of the patient confirms presence of the radioactive
seed.

Order number of [Z5] seed:  [PHONE_NUMBER].

Total activity:  0.253 millicuries.  Reference Date: [DATE].

The patient tolerated the procedure well and was released from the
[REDACTED]. She was given instructions regarding seed removal.
IMPRESSION: Radioactive seed localization LEFT breast. No apparent
complications.

## 2020-10-14 IMAGING — MG MM BREAST LOCALIZATION CLIP
4 series · 4 of 12 positions shown · non-contrast
Comparison: Previous exam(s).

CLINICAL DATA: Evaluate seed placement following ultrasound-guided
radioactive seed localization of LEFT breast cancer.

EXAM:
DIAGNOSTIC LEFT MAMMOGRAM POST ULTRASOUND-GUIDED RADIOACTIVE SEED
PLACEMENT

[L LM synth-2D]
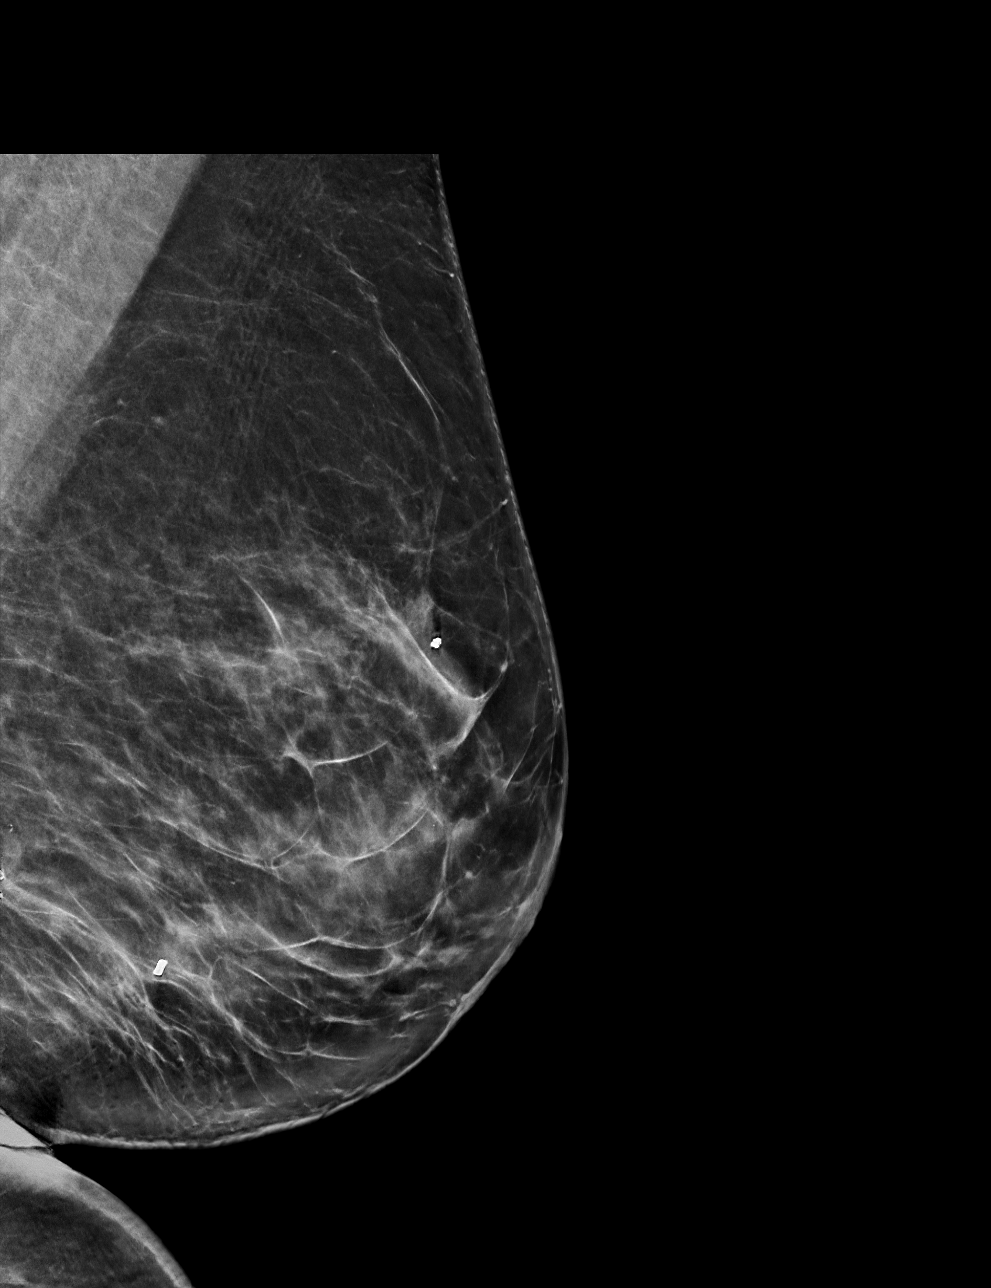

[L CC synth-2D]
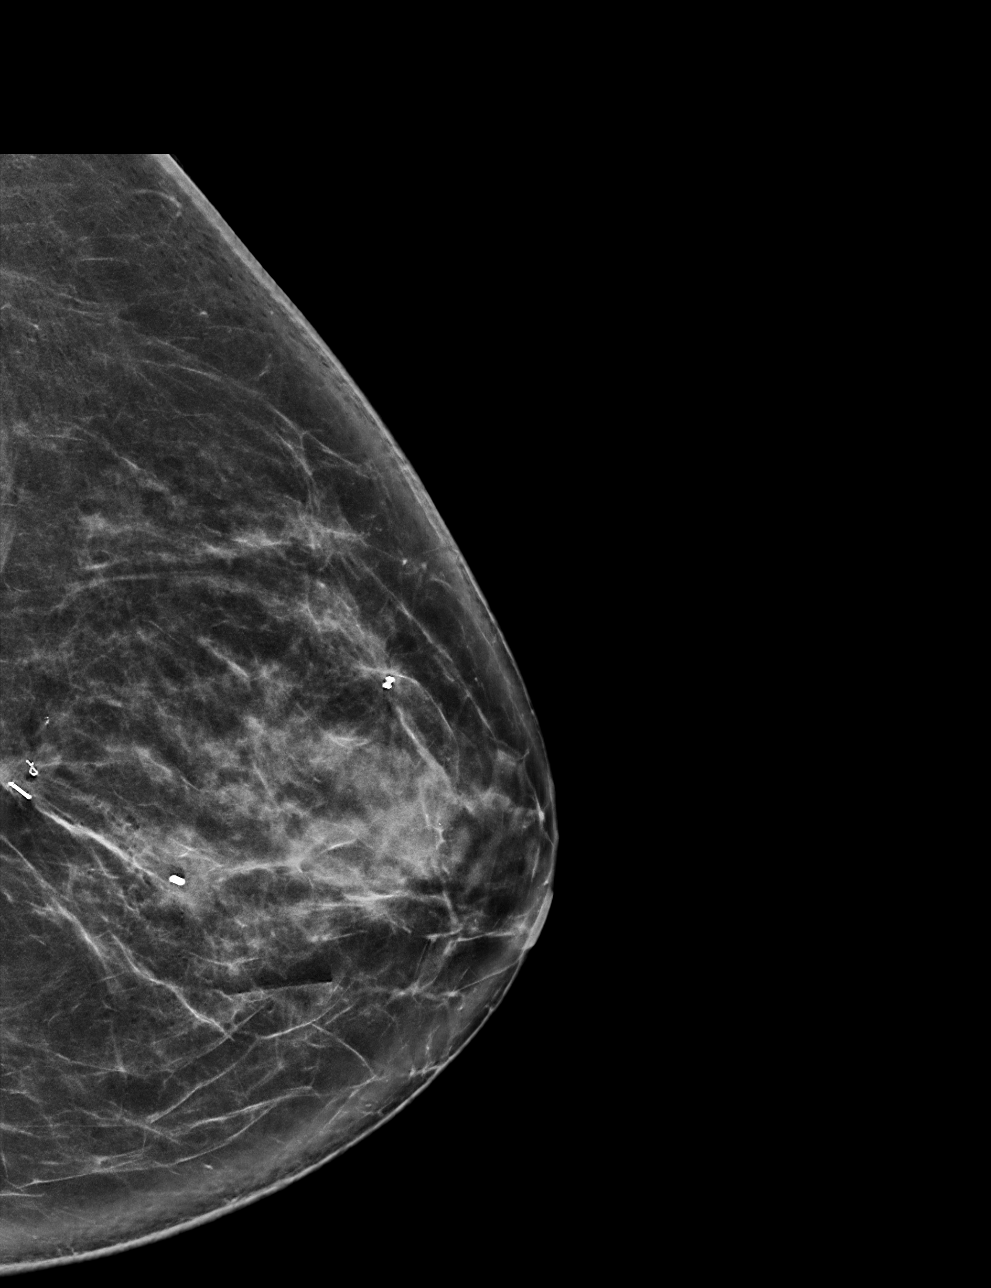

[L CC tomo · tomo slice 37/74.0]
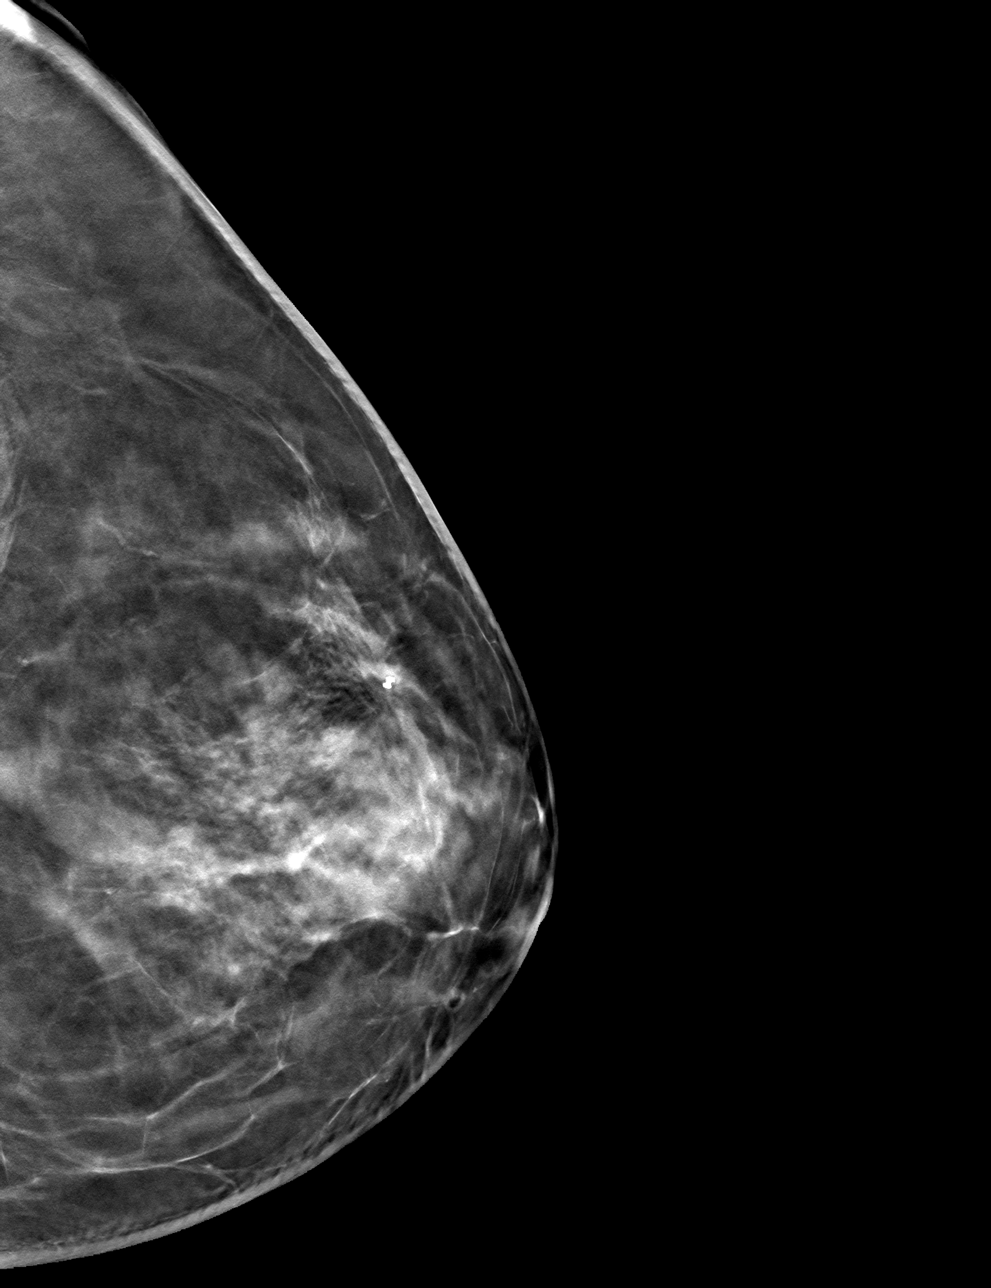

[L LM tomo · tomo slice 37/74.0]
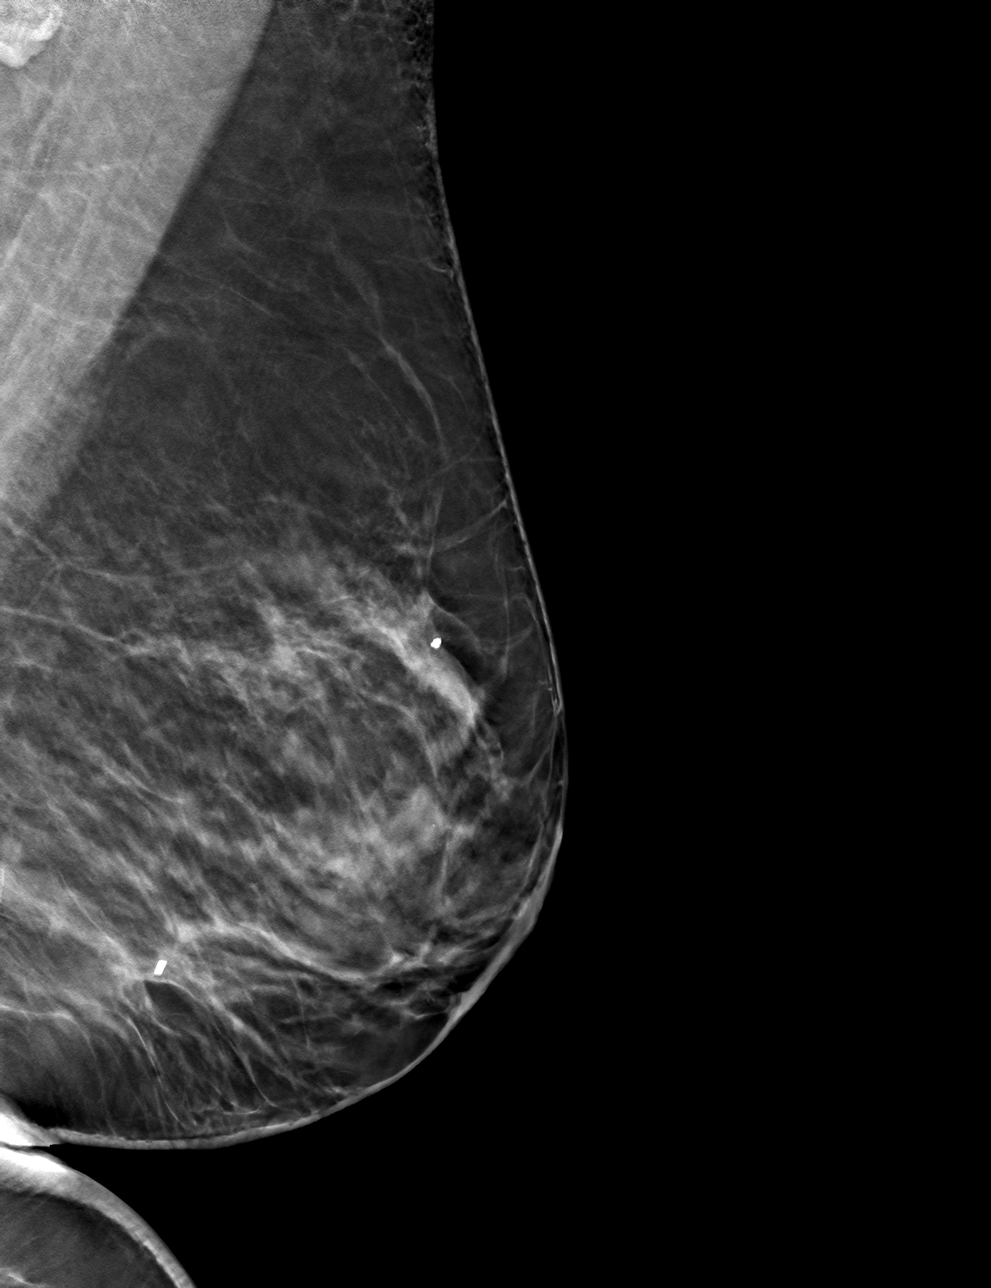

[4 of 12 positions shown; findings below may reference images not displayed]

FINDINGS: Mammographic images were obtained following ultrasound-guided
radioactive seed placement. On the 2D/3D CC view, there is
appropriate positioning of the radioactive seed within the LEFT
breast mass and adjacent to the RIBBON clip. Only the tips of the
seed and RIBBON clip are noted on the LATERAL view due to far
posterior position.
IMPRESSION: Appropriate location of the radioactive seed within the LEFT breast.

Final Assessment: Post Procedure Mammograms for Seed Placement

## 2020-10-15 ENCOUNTER — Ambulatory Visit (HOSPITAL_BASED_OUTPATIENT_CLINIC_OR_DEPARTMENT_OTHER): Payer: BC Managed Care – PPO | Admitting: Certified Registered"

## 2020-10-15 ENCOUNTER — Encounter (HOSPITAL_COMMUNITY)
Admission: RE | Admit: 2020-10-15 | Discharge: 2020-10-15 | Disposition: A | Discharge status: Home / Self Care | Payer: BC Managed Care – PPO | Source: Ambulatory Visit | Attending: General Surgery | Admitting: General Surgery

## 2020-10-15 ENCOUNTER — Encounter (HOSPITAL_BASED_OUTPATIENT_CLINIC_OR_DEPARTMENT_OTHER): Payer: Self-pay | Admitting: General Surgery

## 2020-10-15 ENCOUNTER — Ambulatory Visit (HOSPITAL_BASED_OUTPATIENT_CLINIC_OR_DEPARTMENT_OTHER)
Admission: RE | Admit: 2020-10-15 | Discharge: 2020-10-15 | Disposition: A | Discharge status: Home / Self Care | Payer: BC Managed Care – PPO | Attending: General Surgery | Admitting: General Surgery

## 2020-10-15 ENCOUNTER — Ambulatory Visit
Admission: RE | Admit: 2020-10-15 | Discharge: 2020-10-15 | Disposition: A | Discharge status: Home / Self Care | Payer: BC Managed Care – PPO | Source: Ambulatory Visit | Attending: General Surgery | Admitting: General Surgery

## 2020-10-15 ENCOUNTER — Other Ambulatory Visit: Payer: Self-pay

## 2020-10-15 ENCOUNTER — Encounter (HOSPITAL_BASED_OUTPATIENT_CLINIC_OR_DEPARTMENT_OTHER)
Admission: RE | Disposition: A | Discharge status: Home / Self Care | Payer: Self-pay | Source: Home / Self Care | Attending: General Surgery

## 2020-10-15 ENCOUNTER — Ambulatory Visit (HOSPITAL_COMMUNITY): Payer: BC Managed Care – PPO

## 2020-10-15 DIAGNOSIS — Z803 Family history of malignant neoplasm of breast: Secondary | ICD-10-CM | POA: Insufficient documentation

## 2020-10-15 DIAGNOSIS — Z7984 Long term (current) use of oral hypoglycemic drugs: Secondary | ICD-10-CM | POA: Diagnosis not present

## 2020-10-15 DIAGNOSIS — N6489 Other specified disorders of breast: Secondary | ICD-10-CM | POA: Insufficient documentation

## 2020-10-15 DIAGNOSIS — C50512 Malignant neoplasm of lower-outer quadrant of left female breast: Secondary | ICD-10-CM | POA: Diagnosis not present

## 2020-10-15 DIAGNOSIS — E119 Type 2 diabetes mellitus without complications: Secondary | ICD-10-CM | POA: Insufficient documentation

## 2020-10-15 DIAGNOSIS — Z170 Estrogen receptor positive status [ER+]: Secondary | ICD-10-CM | POA: Insufficient documentation

## 2020-10-15 DIAGNOSIS — Z79899 Other long term (current) drug therapy: Secondary | ICD-10-CM | POA: Diagnosis not present

## 2020-10-15 HISTORY — PX: BREAST LUMPECTOMY WITH RADIOACTIVE SEED AND SENTINEL LYMPH NODE BIOPSY: SHX6550

## 2020-10-15 HISTORY — DX: Type 2 diabetes mellitus without complications: E11.9

## 2020-10-15 LAB — GLUCOSE, CAPILLARY
Glucose-Capillary: 118 mg/dL — ABNORMAL HIGH (ref 70–99)
Glucose-Capillary: 161 mg/dL — ABNORMAL HIGH (ref 70–99)

## 2020-10-15 IMAGING — DX MM BREAST SURGICAL SPECIMEN
1 series · 2 of 2 positions shown · non-contrast
Comparison: Previous exam(s).

CLINICAL DATA: Biopsy-proven grade 1-2 invasive ductal carcinoma
involving the LOWER OUTER QUADRANT of the LEFT breast. Radioactive
seed localization was performed yesterday in anticipation of today's
lumpectomy.

EXAM:
SPECIMEN RADIOGRAPH OF THE LEFT BREAST

[Series 1: specimen digital x-ray · left · 0.07mm/px · 2 of 2 slices shown]
[im 1/2]
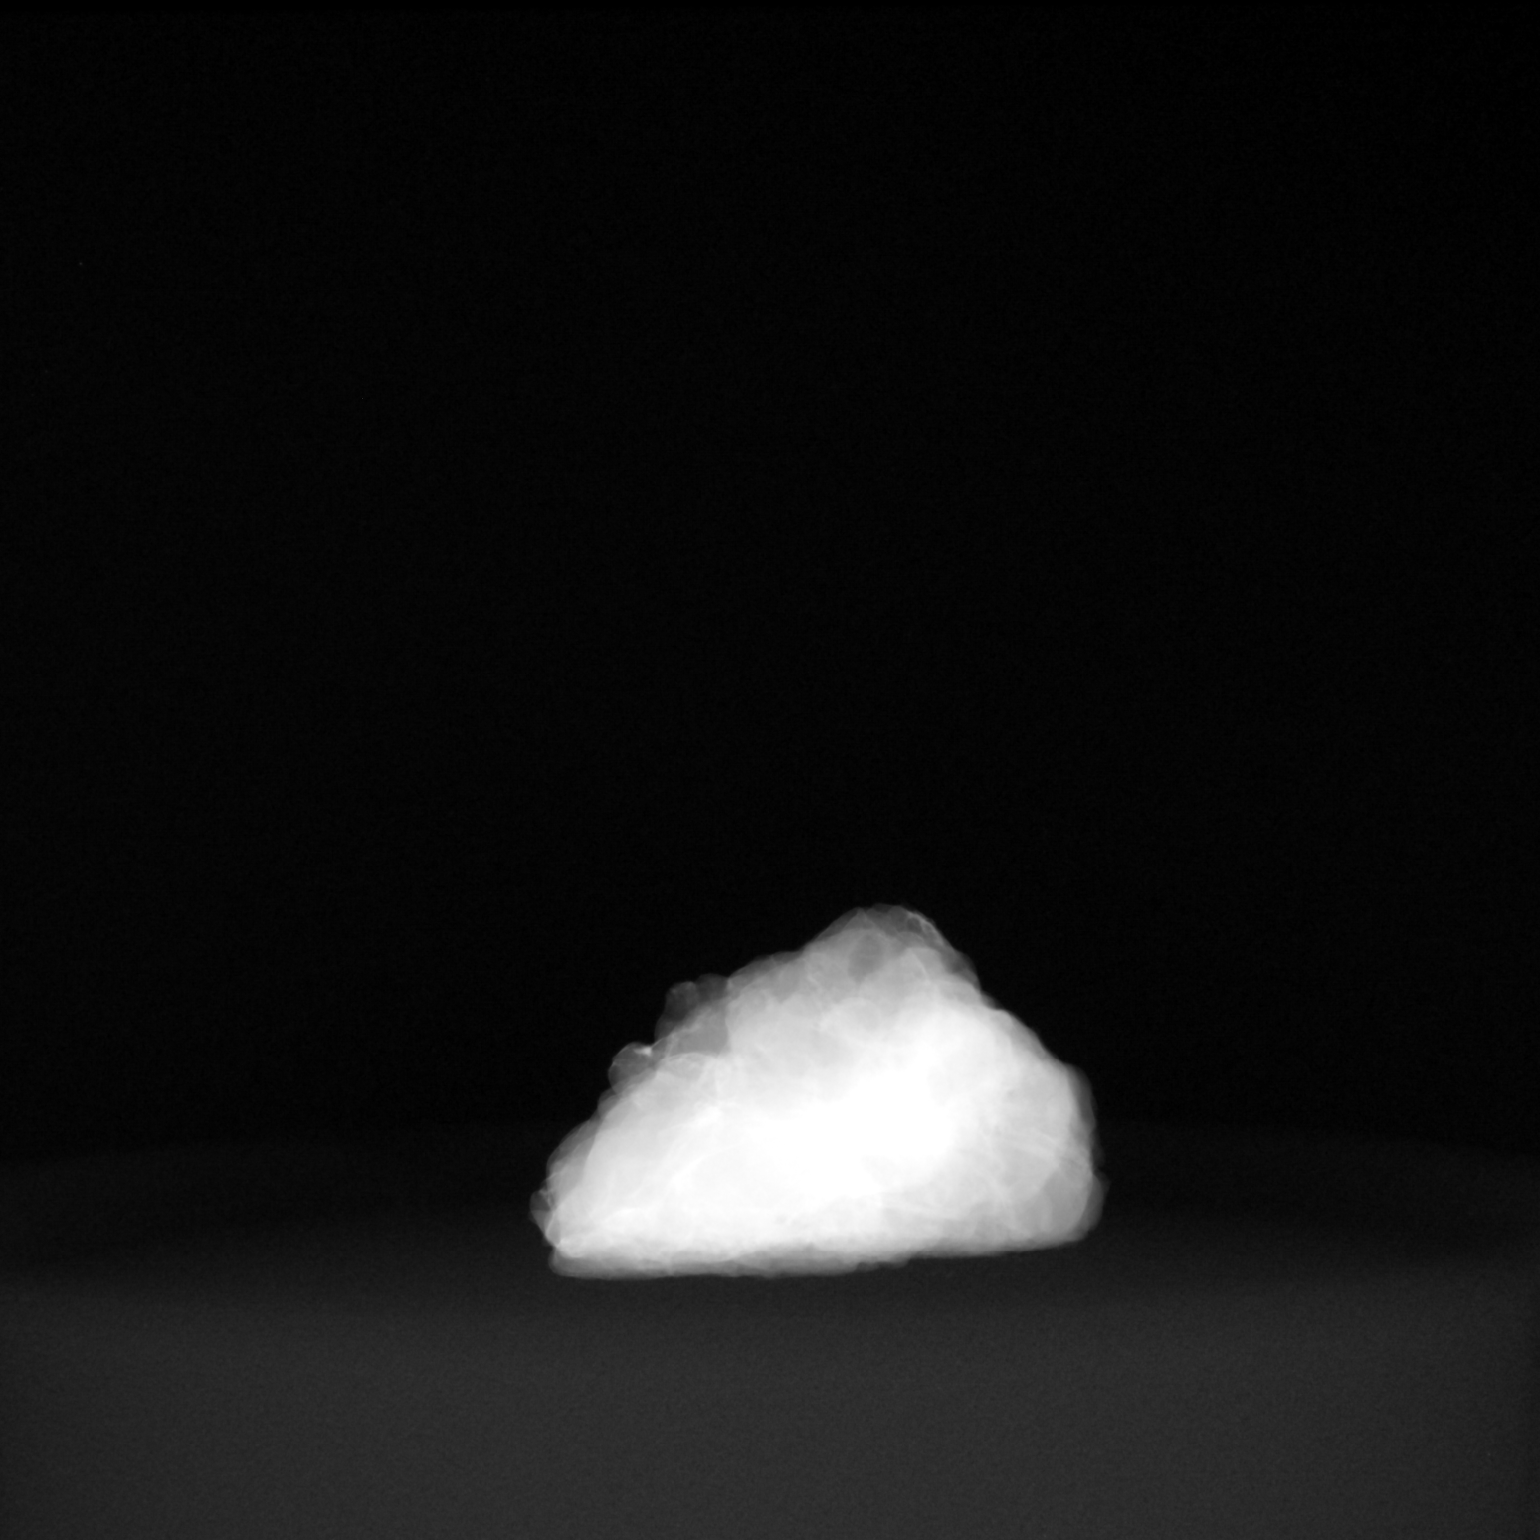
[im 2/2]
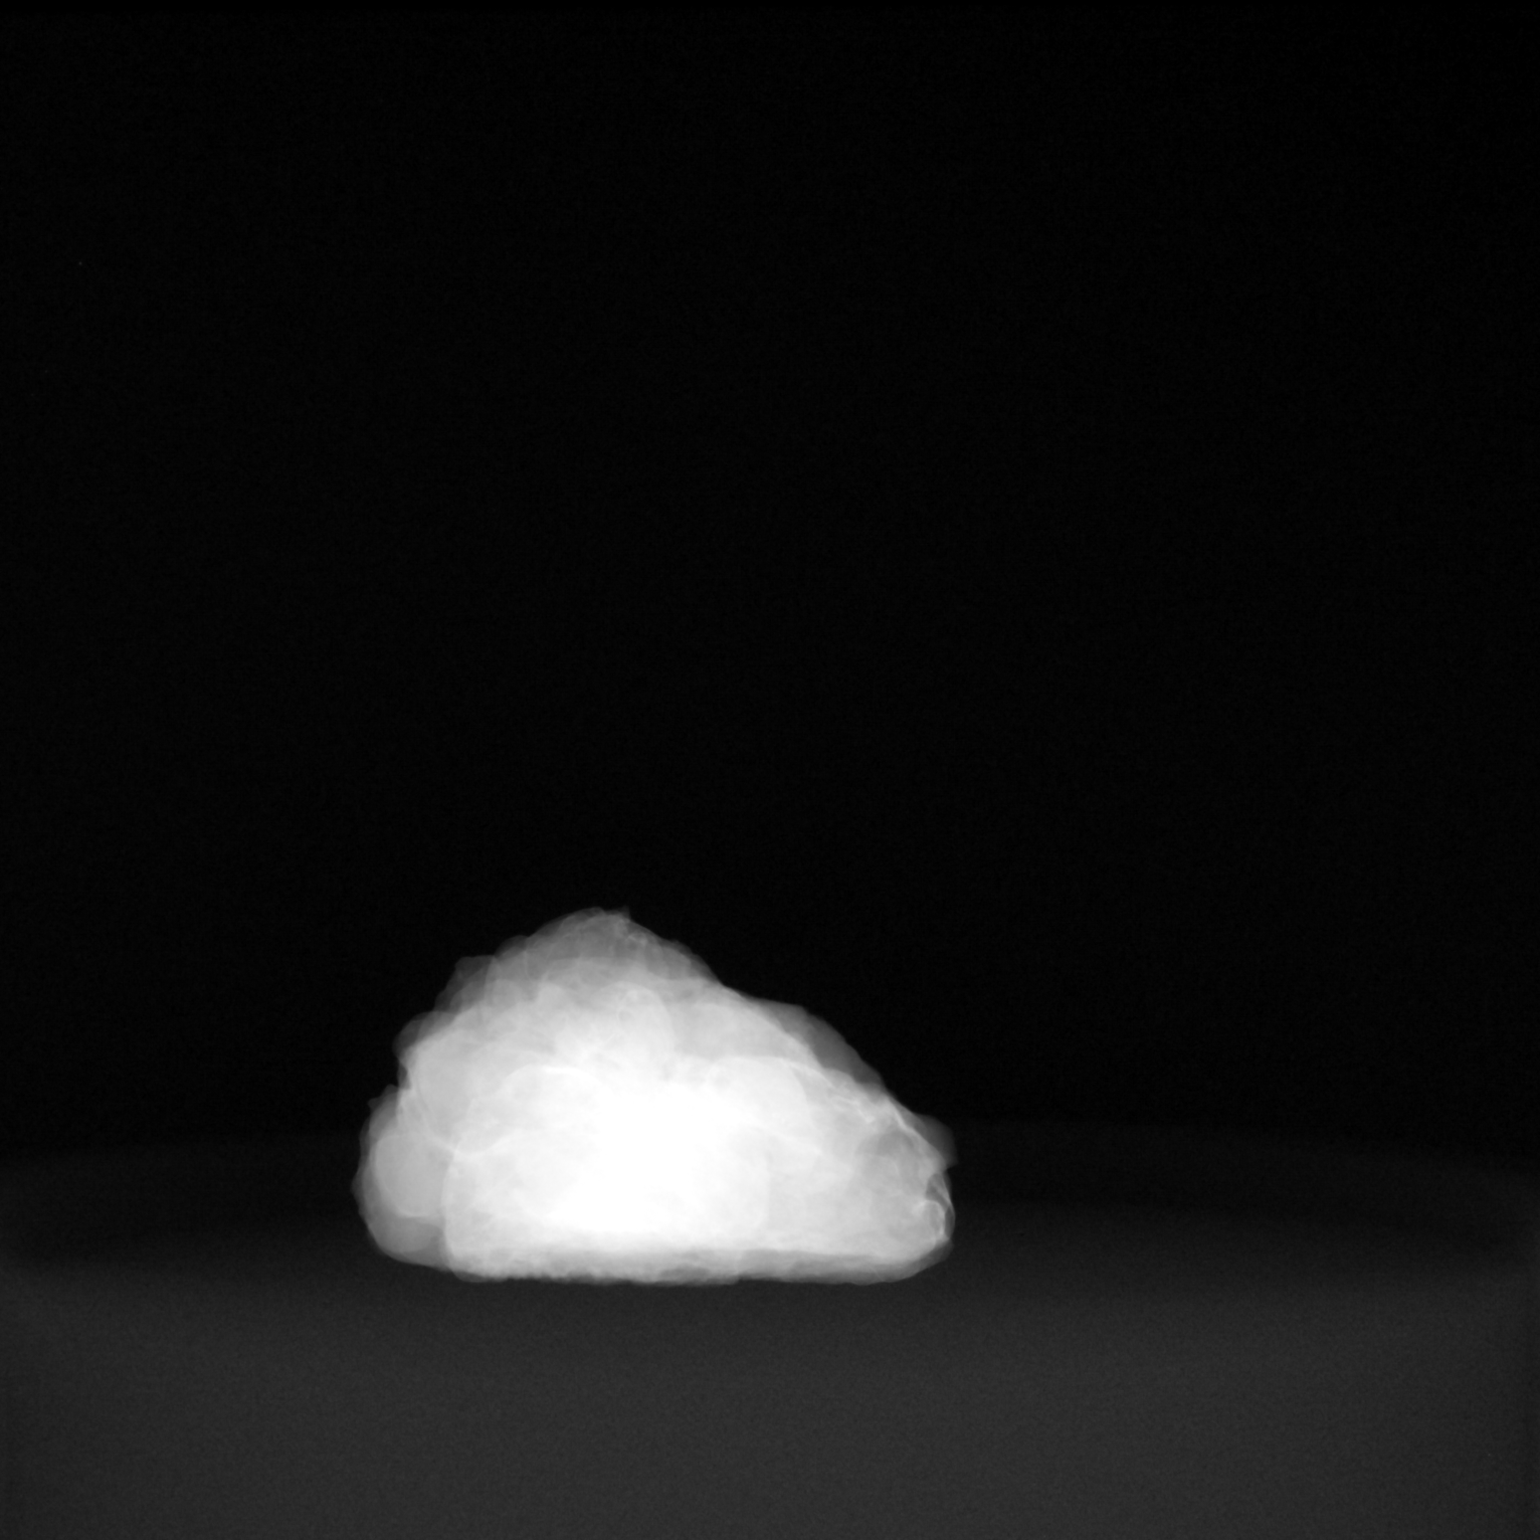

[2 of 2 positions shown; findings below may reference images not displayed]

FINDINGS: Status post excision of the LEFT breast. The radioactive seed and
the ribbon shaped tissue marker clip are present in the
non-compressed specimen. The seed is intact. This was discussed by
telephone with the operating room nurse at the time of
interpretation on [DATE] at approximately [DATE] a.m.
IMPRESSION: Specimen radiograph of the LEFT breast.

## 2020-10-15 SURGERY — BREAST LUMPECTOMY WITH RADIOACTIVE SEED AND SENTINEL LYMPH NODE BIOPSY
Anesthesia: General | Site: Breast | Laterality: Left

## 2020-10-15 MED ORDER — FENTANYL CITRATE (PF) 100 MCG/2ML IJ SOLN
INTRAMUSCULAR | Status: AC
  Filled 2020-10-15: qty 2

## 2020-10-15 MED ORDER — ENSURE PRE-SURGERY PO LIQD: 296.0000 mL | Freq: Once | ORAL | Status: DC

## 2020-10-15 MED ORDER — FENTANYL CITRATE (PF) 100 MCG/2ML IJ SOLN: 25.0000 ug | INTRAMUSCULAR | Status: DC | PRN

## 2020-10-15 MED ORDER — PHENYLEPHRINE HCL (PRESSORS) 10 MG/ML IV SOLN
INTRAVENOUS | Status: DC | PRN
  Administered 2020-10-15: 40 ug via INTRAVENOUS
  Administered 2020-10-15: 80 ug via INTRAVENOUS
  Administered 2020-10-15 (×2): 40 ug via INTRAVENOUS

## 2020-10-15 MED ORDER — LACTATED RINGERS IV SOLN: INTRAVENOUS | Status: DC

## 2020-10-15 MED ORDER — CEFAZOLIN SODIUM-DEXTROSE 2-4 GM/100ML-% IV SOLN
2.0000 g | INTRAVENOUS | Status: AC
  Administered 2020-10-15: 2 g via INTRAVENOUS

## 2020-10-15 MED ORDER — OXYCODONE HCL 5 MG/5ML PO SOLN: 5.0000 mg | Freq: Once | ORAL | Status: DC | PRN

## 2020-10-15 MED ORDER — CEFAZOLIN SODIUM-DEXTROSE 2-4 GM/100ML-% IV SOLN
INTRAVENOUS | Status: AC
  Filled 2020-10-15: qty 100

## 2020-10-15 MED ORDER — FENTANYL CITRATE (PF) 100 MCG/2ML IJ SOLN
INTRAMUSCULAR | Status: DC | PRN
  Administered 2020-10-15: 25 ug via INTRAVENOUS

## 2020-10-15 MED ORDER — MIDAZOLAM HCL 2 MG/2ML IJ SOLN
2.0000 mg | Freq: Once | INTRAMUSCULAR | Status: AC
  Administered 2020-10-15: 2 mg via INTRAVENOUS

## 2020-10-15 MED ORDER — ONDANSETRON HCL 4 MG/2ML IJ SOLN
INTRAMUSCULAR | Status: AC
  Filled 2020-10-15: qty 2

## 2020-10-15 MED ORDER — KETOROLAC TROMETHAMINE 15 MG/ML IJ SOLN
INTRAMUSCULAR | Status: AC
  Filled 2020-10-15: qty 1

## 2020-10-15 MED ORDER — PROPOFOL 10 MG/ML IV BOLUS
INTRAVENOUS | Status: DC | PRN
  Administered 2020-10-15: 160 mg via INTRAVENOUS

## 2020-10-15 MED ORDER — ONDANSETRON HCL 4 MG/2ML IJ SOLN: 4.0000 mg | Freq: Once | INTRAMUSCULAR | Status: DC | PRN

## 2020-10-15 MED ORDER — MIDAZOLAM HCL 2 MG/2ML IJ SOLN
INTRAMUSCULAR | Status: AC
  Filled 2020-10-15: qty 2

## 2020-10-15 MED ORDER — ONDANSETRON HCL 4 MG/2ML IJ SOLN
INTRAMUSCULAR | Status: DC | PRN
  Administered 2020-10-15: 4 mg via INTRAVENOUS

## 2020-10-15 MED ORDER — OXYCODONE HCL 5 MG/5ML PO SOLN
5.0000 mg | Freq: Once | ORAL | Status: DC | PRN
Start: 2020-10-15 — End: 2020-10-15

## 2020-10-15 MED ORDER — FENTANYL CITRATE (PF) 100 MCG/2ML IJ SOLN
100.0000 ug | Freq: Once | INTRAMUSCULAR | Status: AC
Start: 2020-10-15 — End: 2020-10-15
  Administered 2020-10-15: 100 ug via INTRAVENOUS

## 2020-10-15 MED ORDER — LIDOCAINE 2% (20 MG/ML) 5 ML SYRINGE
INTRAMUSCULAR | Status: AC
  Filled 2020-10-15: qty 5

## 2020-10-15 MED ORDER — DEXAMETHASONE SODIUM PHOSPHATE 10 MG/ML IJ SOLN
INTRAMUSCULAR | Status: DC | PRN
  Administered 2020-10-15: 5 mg via INTRAVENOUS

## 2020-10-15 MED ORDER — LIDOCAINE HCL (CARDIAC) PF 100 MG/5ML IV SOSY
PREFILLED_SYRINGE | INTRAVENOUS | Status: DC | PRN
  Administered 2020-10-15: 40 mg via INTRAVENOUS

## 2020-10-15 MED ORDER — TECHNETIUM TC 99M TILMANOCEPT KIT
1.0000 | PACK | Freq: Once | INTRAVENOUS | Status: AC | PRN
  Administered 2020-10-15: 1 via INTRADERMAL

## 2020-10-15 MED ORDER — BUPIVACAINE HCL (PF) 0.25 % IJ SOLN
INTRAMUSCULAR | Status: DC | PRN
  Administered 2020-10-15: 5 mL

## 2020-10-15 MED ORDER — ACETAMINOPHEN 500 MG PO TABS
ORAL_TABLET | ORAL | Status: AC
  Filled 2020-10-15: qty 2

## 2020-10-15 MED ORDER — DEXAMETHASONE SODIUM PHOSPHATE 10 MG/ML IJ SOLN
INTRAMUSCULAR | Status: AC
  Filled 2020-10-15: qty 1

## 2020-10-15 MED ORDER — TRAMADOL HCL 50 MG PO TABS: 50.0000 mg | ORAL_TABLET | Freq: Four times a day (QID) | ORAL | 0 refills | Status: DC | PRN

## 2020-10-15 MED ORDER — PHENYLEPHRINE 40 MCG/ML (10ML) SYRINGE FOR IV PUSH (FOR BLOOD PRESSURE SUPPORT)
PREFILLED_SYRINGE | INTRAVENOUS | Status: AC
  Filled 2020-10-15: qty 20

## 2020-10-15 MED ORDER — KETOROLAC TROMETHAMINE 15 MG/ML IJ SOLN
15.0000 mg | INTRAMUSCULAR | Status: AC
  Administered 2020-10-15: 15 mg via INTRAVENOUS

## 2020-10-15 MED ORDER — OXYCODONE HCL 5 MG PO TABS: 5.0000 mg | ORAL_TABLET | Freq: Once | ORAL | Status: DC | PRN

## 2020-10-15 MED ORDER — ACETAMINOPHEN 500 MG PO TABS
1000.0000 mg | ORAL_TABLET | ORAL | Status: AC
  Administered 2020-10-15: 1000 mg via ORAL

## 2020-10-15 SURGICAL SUPPLY — 61 items
ADH SKN CLS APL DERMABOND .7 (GAUZE/BANDAGES/DRESSINGS) ×1
APL PRP STRL LF DISP 70% ISPRP (MISCELLANEOUS) ×1
APPLIER CLIP 9.375 MED OPEN (MISCELLANEOUS) ×2
APR CLP MED 9.3 20 MLT OPN (MISCELLANEOUS) ×1
BINDER BREAST LRG (GAUZE/BANDAGES/DRESSINGS) ×1 IMPLANT
BINDER BREAST MEDIUM (GAUZE/BANDAGES/DRESSINGS) IMPLANT
BINDER BREAST XLRG (GAUZE/BANDAGES/DRESSINGS) IMPLANT
BINDER BREAST XXLRG (GAUZE/BANDAGES/DRESSINGS) IMPLANT
BLADE SURG 15 STRL LF DISP TIS (BLADE) ×1 IMPLANT
BLADE SURG 15 STRL SS (BLADE) ×2
CANISTER SUC SOCK COL 7IN (MISCELLANEOUS) IMPLANT
CANISTER SUCT 1200ML W/VALVE (MISCELLANEOUS) IMPLANT
CHLORAPREP W/TINT 26 (MISCELLANEOUS) ×2 IMPLANT
CLIP APPLIE 9.375 MED OPEN (MISCELLANEOUS) IMPLANT
CLIP VESOCCLUDE SM WIDE 6/CT (CLIP) ×2 IMPLANT
COVER BACK TABLE 60X90IN (DRAPES) ×2 IMPLANT
COVER MAYO STAND STRL (DRAPES) ×2 IMPLANT
COVER PROBE W GEL 5X96 (DRAPES) ×2 IMPLANT
COVER WAND RF STERILE (DRAPES) IMPLANT
DECANTER SPIKE VIAL GLASS SM (MISCELLANEOUS) IMPLANT
DERMABOND ADVANCED (GAUZE/BANDAGES/DRESSINGS) ×1
DERMABOND ADVANCED .7 DNX12 (GAUZE/BANDAGES/DRESSINGS) ×1 IMPLANT
DRAPE LAPAROSCOPIC ABDOMINAL (DRAPES) ×2 IMPLANT
DRAPE UTILITY XL STRL (DRAPES) ×2 IMPLANT
ELECT COATED BLADE 2.86 ST (ELECTRODE) ×2 IMPLANT
ELECT REM PT RETURN 9FT ADLT (ELECTROSURGICAL) ×2
ELECTRODE REM PT RTRN 9FT ADLT (ELECTROSURGICAL) ×1 IMPLANT
GLOVE SURG ENC MOIS LTX SZ7 (GLOVE) ×4 IMPLANT
GLOVE SURG SYN 7.0 (GLOVE) ×2 IMPLANT
GLOVE SURG SYN 7.0 PF PI (GLOVE) IMPLANT
GLOVE SURG UNDER POLY LF SZ7 (GLOVE) ×2 IMPLANT
GLOVE SURG UNDER POLY LF SZ7.5 (GLOVE) ×2 IMPLANT
GOWN STRL REUS W/ TWL LRG LVL3 (GOWN DISPOSABLE) ×2 IMPLANT
GOWN STRL REUS W/TWL LRG LVL3 (GOWN DISPOSABLE) ×6
HEMOSTAT ARISTA ABSORB 3G PWDR (HEMOSTASIS) IMPLANT
KIT MARKER MARGIN INK (KITS) ×2 IMPLANT
NDL HYPO 25X1 1.5 SAFETY (NEEDLE) ×1 IMPLANT
NDL SAFETY ECLIPSE 18X1.5 (NEEDLE) IMPLANT
NEEDLE HYPO 18GX1.5 SHARP (NEEDLE)
NEEDLE HYPO 25X1 1.5 SAFETY (NEEDLE) ×2 IMPLANT
NS IRRIG 1000ML POUR BTL (IV SOLUTION) ×1 IMPLANT
PACK BASIN DAY SURGERY FS (CUSTOM PROCEDURE TRAY) ×2 IMPLANT
PENCIL SMOKE EVACUATOR (MISCELLANEOUS) ×2 IMPLANT
RETRACTOR ONETRAX LX 90X20 (MISCELLANEOUS) IMPLANT
SLEEVE SCD COMPRESS KNEE MED (STOCKING) ×2 IMPLANT
SPONGE LAP 4X18 RFD (DISPOSABLE) ×2 IMPLANT
STRIP CLOSURE SKIN 1/2X4 (GAUZE/BANDAGES/DRESSINGS) ×2 IMPLANT
SUT ETHILON 2 0 FS 18 (SUTURE) IMPLANT
SUT MNCRL AB 4-0 PS2 18 (SUTURE) ×3 IMPLANT
SUT MON AB 5-0 PS2 18 (SUTURE) ×1 IMPLANT
SUT SILK 2 0 SH (SUTURE) ×1 IMPLANT
SUT VIC AB 2-0 SH 27 (SUTURE) ×6
SUT VIC AB 2-0 SH 27XBRD (SUTURE) ×1 IMPLANT
SUT VIC AB 3-0 SH 27 (SUTURE) ×6
SUT VIC AB 3-0 SH 27X BRD (SUTURE) ×1 IMPLANT
SUT VIC AB 5-0 PS2 18 (SUTURE) IMPLANT
SYR CONTROL 10ML LL (SYRINGE) ×2 IMPLANT
TOWEL GREEN STERILE FF (TOWEL DISPOSABLE) ×2 IMPLANT
TRAY FAXITRON CT DISP (TRAY / TRAY PROCEDURE) ×2 IMPLANT
TUBE CONNECTING 20X1/4 (TUBING) IMPLANT
YANKAUER SUCT BULB TIP NO VENT (SUCTIONS) IMPLANT

## 2020-10-15 NOTE — Anesthesia Procedure Notes (Signed)
Procedure Name: LMA Insertion Date/Time: 10/15/2020 8:42 AM Performed by: Lavonia Dana, CRNA Pre-anesthesia Checklist: Patient identified, Emergency Drugs available, Suction available and Patient being monitored Patient Re-evaluated:Patient Re-evaluated prior to induction Oxygen Delivery Method: Circle system utilized Preoxygenation: Pre-oxygenation with 100% oxygen Induction Type: IV induction Ventilation: Mask ventilation without difficulty LMA: LMA inserted LMA Size: 4.0 Number of attempts: 1 Airway Equipment and Method: Bite block Placement Confirmation: positive ETCO2 Tube secured with: Tape Dental Injury: Teeth and Oropharynx as per pre-operative assessment

## 2020-10-15 NOTE — Progress Notes (Signed)
Nuc med inj performed by nuc med staff. Pt tol well with no additional sedation. VSS, see flowsheet, emot support provided

## 2020-10-15 NOTE — Op Note (Signed)
Preoperative diagnosis: Clinical stage I left breast cancer Postoperative diagnosis: Same as above Procedure: 1.  Left breast radioactive seed guided lumpectomy 2.  Left deep axillary sentinel lymph node biopsy Surgeon: Dr. Serita Grammes Anesthesia: General with a pectoral block Estimated blood loss: Minimal Complications: None Drains: None Specimens: 1.  Left breast tissue marked with paint containing seed and clip 2.  Additional superior and lateral margins marked short superior, long lateral, double deep Sponge count was correct at completion Disposition to recovery stable addition  Indications:54 yof who has a 1.1 cm mass in the left breast and on [place] is 1.2 cm. nodes are normal by axilla. MRI on right negative. MRI on left shows a 2.1 cm mass loq, small 6 mm mass and nme. the two other biopsies are adenosis with apocrine metaplasia and concordant. 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  We discussed options and elected to proceed with lumpectomy and sentinel lymph node biopsy.  She had a radioactive seed placed prior to beginning I had these mammograms available in the operating room.  Procedure: After informed consent was obtained the patient first underwent a pectoral block.  She had SCDs in place.  She was given antibiotics.  She was then placed under general anesthesia without complication.  She was prepped and draped in the standard sterile surgical fashion.  Surgical timeout was then performed.  I first did the lumpectomy.  This was in the lower pole of the breast.  I infiltrated Marcaine and made an incision in the inframammary fold in order to hide the incision later.  I then dissected to the seed in the anterior plane.  There is really no more anterior tissue except the skin in this location.  I then proceeded to remove the seed with neoprobe guidance with an attempt to remove a rim of normal tissue around  it.  This was then passed off the table.  The posterior margin is now the muscle.  I did 3D imaging in the operating room and thought I might be close to the superior and lateral margin so I excised these and marked these as above.  I obtained hemostasis.  I mobilized the tissue to bring the breast tissue together.  I did this with 2-0 Vicryl.  I placed a clip in the cavity.  I then closed the skin with 3-0 Vicryl for Monocryl.  Glue and Steri-Strips were applied.  I located a sentinel node in the low axilla.  I then infiltrated some Marcaine at this site.  I made incision carried it through the axillary fascia.  I was able to identify what appeared to be a couple of sentinel lymph nodes with the highest count of 252.  The background radioactivity was less than 10.  There were no other palpable nodes.  I passed these off the table.  I then obtained hemostasis.  I then closed the axillary fascia with 2-0 Vicryl.  The skin was closed with 3-0 Vicryl and 4-0 Monocryl.  Glue and Steri-Strips were applied.  She tolerated this well was extubated transferred to recovery stable.

## 2020-10-15 NOTE — Progress Notes (Signed)
Assisted Dr. Joslin with left, ultrasound guided, pectoralis block. Side rails up, monitors on throughout procedure. See vital signs in flow sheet. Tolerated Procedure well. 

## 2020-10-15 NOTE — Anesthesia Preprocedure Evaluation (Signed)
Anesthesia Evaluation  Patient identified by MRN, date of birth, ID band Patient awake    Reviewed: Allergy & Precautions, NPO status , Patient's Chart, lab work & pertinent test results  Airway Mallampati: II  TM Distance: >3 FB Neck ROM: Full    Dental  (+) Teeth Intact, Dental Advisory Given   Pulmonary    breath sounds clear to auscultation       Cardiovascular  Rhythm:Regular Rate:Normal     Neuro/Psych    GI/Hepatic   Endo/Other  diabetes  Renal/GU      Musculoskeletal   Abdominal   Peds  Hematology   Anesthesia Other Findings   Reproductive/Obstetrics                             Anesthesia Physical Anesthesia Plan  ASA: II  Anesthesia Plan: General   Post-op Pain Management:  Regional for Post-op pain   Induction: Intravenous  PONV Risk Score and Plan: Ondansetron and Dexamethasone  Airway Management Planned: LMA  Additional Equipment:   Intra-op Plan:   Post-operative Plan:   Informed Consent: I have reviewed the patients History and Physical, chart, labs and discussed the procedure including the risks, benefits and alternatives for the proposed anesthesia with the patient or authorized representative who has indicated his/her understanding and acceptance.     Dental advisory given  Plan Discussed with: CRNA and Anesthesiologist  Anesthesia Plan Comments:         Anesthesia Quick Evaluation

## 2020-10-15 NOTE — Transfer of Care (Signed)
Immediate Anesthesia Transfer of Care Note  Patient: Amy Stephenson  Procedure(s) Performed: LEFT BREAST LUMPECTOMY WITH RADIOACTIVE SEED AND LEFT AXILLARY SENTINEL LYMPH NODE BIOPSY (Left Breast)  Patient Location: PACU  Anesthesia Type:GA combined with regional for post-op pain  Level of Consciousness: drowsy  Airway & Oxygen Therapy: Patient Spontanous Breathing and Patient connected to face mask oxygen  Post-op Assessment: Report given to RN and Post -op Vital signs reviewed and stable  Post vital signs: Reviewed and stable  Last Vitals:  Vitals Value Taken Time  BP 113/85 10/15/20 0951  Temp    Pulse 89 10/15/20 0952  Resp 15 10/15/20 0952  SpO2 98 % 10/15/20 0952  Vitals shown include unvalidated device data.  Last Pain:  Vitals:   10/15/20 0730  TempSrc: Oral  PainSc: 0-No pain      Patients Stated Pain Goal: 3 (93/81/82 9937)  Complications: No complications documented.

## 2020-10-15 NOTE — Interval H&P Note (Signed)
History and Physical Interval Note:  10/15/2020 8:19 AM  Amy Stephenson  has presented today for surgery, with the diagnosis of LEFT BREAST CANCER.  The various methods of treatment have been discussed with the patient and family. After consideration of risks, benefits and other options for treatment, the patient has consented to  Procedure(s) with comments: LEFT BREAST LUMPECTOMY WITH RADIOACTIVE SEED AND LEFT AXILLARY SENTINEL LYMPH NODE BIOPSY (Left) - 90 MINUTES ROOM 8 as a surgical intervention.  The patient's history has been reviewed, patient examined, no change in status, stable for surgery.  I have reviewed the patient's chart and labs.  Questions were answered to the patient's satisfaction.     Rolm Bookbinder

## 2020-10-15 NOTE — Discharge Instructions (Signed)
[facility] Office Phone Number [phone]  BREAST BIOPSY/ PARTIAL MASTECTOMY: POST OP INSTRUCTIONS Take 400 mg of ibuprofen every 8 hours or 650 mg tylenol every 6 hours for next 72 hours then as needed. Use ice several times daily also. Always review your discharge instruction sheet given to you by the facility where your surgery was performed.  IF YOU HAVE DISABILITY OR FAMILY LEAVE FORMS, YOU MUST BRING THEM TO THE OFFICE FOR PROCESSING.  DO NOT GIVE THEM TO YOUR DOCTOR.  1. A prescription for pain medication may be given to you upon discharge.  Take your pain medication as prescribed, if needed.  If narcotic pain medicine is not needed, then you may take acetaminophen (Tylenol), naprosyn (Alleve) or ibuprofen (Advil) as needed. 2. Take your usually prescribed medications unless otherwise directed 3. If you need a refill on your pain medication, please contact your pharmacy.  They will contact our office to request authorization.  Prescriptions will not be filled after 5pm or on week-ends. 4. You should eat very light the first 24 hours after surgery, such as soup, crackers, pudding, etc.  Resume your normal diet the day after surgery. 5. Most patients will experience some swelling and bruising in the breast.  Ice packs and a good support bra will help.  Wear the breast binder provided or a sports bra for 72 hours day and night.  After that wear a sports bra during the day until you return to the office. Swelling and bruising can take several days to resolve.  6. It is common to experience some constipation if taking pain medication after surgery.  Increasing fluid intake and taking a stool softener will usually help or prevent this problem from occurring.  A mild laxative (Milk of Magnesia or Miralax) should be taken according to package directions if there are no bowel movements after 48 hours. 7. Unless discharge instructions indicate otherwise, you may remove your bandages 48  hours after surgery and you may shower at that time.  You may have steri-strips (small skin tapes) in place directly over the incision.  These strips should be left on the skin for 7-10 days and will come off on their own.  If your surgeon used skin glue on the incision, you may shower in 24 hours.  The glue will flake off over the next 2-3 weeks.  Any sutures or staples will be removed at the office during your follow-up visit. 8. ACTIVITIES:  You may resume regular daily activities (gradually increasing) beginning the next day.  Wearing a good support bra or sports bra minimizes pain and swelling.  You may have sexual intercourse when it is comfortable. a. You may drive when you no longer are taking prescription pain medication, you can comfortably wear a seatbelt, and you can safely maneuver your car and apply brakes. b. RETURN TO WORK:  ______________________________________________________________________________________ 9. You should see your doctor in the office for a follow-up appointment approximately two weeks after your surgery.  Your doctor's nurse will typically make your follow-up appointment when she calls you with your pathology report.  Expect your pathology report 3-4 business days after your surgery.  You may call to check if you do not hear from us after three days. 10. OTHER INSTRUCTIONS: _______________________________________________________________________________________________ _____________________________________________________________________________________________________________________________________ _____________________________________________________________________________________________________________________________________ _____________________________________________________________________________________________________________________________________  WHEN TO CALL DR WAKEFIELD: 1. Fever over 101.0 2. Nausea and/or vomiting. 3. Extreme swelling or  bruising. 4. Continued bleeding from incision. 5. Increased pain, redness, or drainage from the incision.  The clinic   staff is available to answer your questions during regular business hours.  Please don't hesitate to call and ask to speak to one of the nurses for clinical concerns.  If you have a medical emergency, go to the nearest emergency room or call 911.  A surgeon from [facility] is always on call at the hospital.  For further questions, please visit [email or web address] **May have Tylenol or Ibuprofen after 1:40pm today. Information for Discharge Teaching: EXPAREL (bupivacaine liposome injectable suspension)   Your surgeon or anesthesiologist gave you EXPAREL(bupivacaine) to help control your pain after surgery.   EXPAREL is a local anesthetic that provides pain relief by numbing the tissue around the surgical site.  EXPAREL is designed to release pain medication over time and can control pain for up to 72 hours.  Depending on how you respond to EXPAREL, you may require less pain medication during your recovery.  Possible side effects:  Temporary loss of sensation or ability to move in the area where bupivacaine was injected.  Nausea, vomiting, constipation  Rarely, numbness and tingling in your mouth or lips, lightheadedness, or anxiety may occur.  Call your doctor right away if you think you may be experiencing any of these sensations, or if you have other questions regarding possible side effects.  Follow all other discharge instructions given to you by your surgeon or nurse. Eat a healthy diet and drink plenty of water or other fluids.  If you return to the hospital for any reason within 96 hours following the administration of EXPAREL, it is important for health care providers to know that you have received this anesthetic. A teal colored band has been placed on your arm with the date, time and amount of EXPAREL you have received in order to alert  and inform your health care providers. Please leave this armband in place for the full 96 hours following administration, and then you may remove the band. Post Anesthesia Home Care Instructions  Activity: Get plenty of rest for the remainder of the day. A responsible individual must stay with you for 24 hours following the procedure.  For the next 24 hours, DO NOT: -Drive a car -Paediatric nurse -Drink alcoholic beverages -Take any medication unless instructed by your physician -Make any legal decisions or sign important papers.  Meals: Start with liquid foods such as gelatin or soup. Progress to regular foods as tolerated. Avoid greasy, spicy, heavy foods. If nausea and/or vomiting occur, drink only clear liquids until the nausea and/or vomiting subsides. Call your physician if vomiting continues.  Special Instructions/Symptoms: Your throat may feel dry or sore from the anesthesia or the breathing tube placed in your throat during surgery. If this causes discomfort, gargle with warm salt water. The discomfort should disappear within 24 hours.  If you had a scopolamine patch placed behind your ear for the management of post- operative nausea and/or vomiting:  1. The medication in the patch is effective for 72 hours, after which it should be removed.  Wrap patch in a tissue and discard in the trash. Wash hands thoroughly with soap and water. 2. You may remove the patch earlier than 72 hours if you experience unpleasant side effects which may include dry mouth, dizziness or visual disturbances. 3. Avoid touching the patch. Wash your hands with soap and water after contact with the patch.

## 2020-10-15 NOTE — Anesthesia Postprocedure Evaluation (Signed)
Anesthesia Post Note  Patient: Amy Stephenson  Procedure(s) Performed: LEFT BREAST LUMPECTOMY WITH RADIOACTIVE SEED AND LEFT AXILLARY SENTINEL LYMPH NODE BIOPSY (Left Breast)     Patient location during evaluation: PACU Anesthesia Type: General Level of consciousness: awake and alert Pain management: pain level controlled Vital Signs Assessment: post-procedure vital signs reviewed and stable Respiratory status: spontaneous breathing, nonlabored ventilation, respiratory function stable and patient connected to nasal cannula oxygen Cardiovascular status: blood pressure returned to baseline and stable Postop Assessment: no apparent nausea or vomiting Anesthetic complications: no   No complications documented.  Last Vitals:  Vitals:   10/15/20 1000 10/15/20 1024  BP: 107/73 122/76  Pulse: 88 86  Resp: 14 20  Temp:  36.6 C  SpO2: 99% 98%    Last Pain:  Vitals:   10/15/20 1024  TempSrc: Oral  PainSc: 0-No pain                 Jessiah Steinhart COKER

## 2020-10-15 NOTE — H&P (Signed)
55 yof with family history of breast cancer in her mom and maternal aunt . she has no mass or dc. she has no prior breast history. she has type II dm otherwise healthy. on screening mm she has c density breasts. she has a 1.1 cm mass in the left breast and on [place] is 1.2 cm. nodes are normal by axilla. MRI on right negative. MRI on left shows a 2.1 cm mass loq, small 6 mm mass and nme. the two other biopsies are adenosis with apocrine metaplasia and concordant. 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 she is here to discuss options.  Past Surgical History Jess Barters, [place]; 09/27/2020 2:32 PM) Breast Biopsy  multiple Cesarean Section - Multiple  Gallbladder Surgery - Laparoscopic   Diagnostic Studies History Jess Barters, CMA; 09/27/2020 2:32 PM) Colonoscopy  1-5 years ago Mammogram  within last year Pap Smear  1-5 years ago  Allergies Jess Barters, CMA; 09/27/2020 2:32 PM) Sulfa-Mag *MINERALS & ELECTROLYTES*  Sulfacetamide *CHEMICALS*  Augmented Betamethasone Diprop *DERMATOLOGICALS*  Allergies Reconciled   Medication History Jess Barters, CMA; 09/27/2020 2:33 PM) Ambien (5MG  Tablet, Oral) Active. Janumet (50-1000MG  Tablet, Oral) Active. Dapagliflozin Propanediol (5MG  Tablet, Oral) Active. SITagliptin Phosphate (25MG  Tablet, Oral) Active. Medications Reconciled  Social History Jess Barters, CMA; 09/27/2020 2:32 PM) Alcohol use  Occasional alcohol use. Caffeine use  Carbonated beverages, Coffee. No drug use  Tobacco use  Never smoker.  Family History Jess Barters, CMA; 09/27/2020 2:32 PM) Arthritis  Mother. Breast Cancer  Mother. Cancer  Father. Colon Cancer  Father. Depression  Mother. Hypertension  Mother. Kidney Disease  Mother.  Pregnancy / Birth History Jess Barters, CMA; 09/27/2020 2:32 PM) Age at menarche  39 years. Age of menopause  26-60 Contraceptive History  Oral  contraceptives. Gravida  4 Maternal age  37-30 Para  2  Other Problems Jess Barters, CMA; 09/27/2020 2:32 PM) Diabetes Mellitus  Lump In Breast   Review of Systems Jess Barters CMA; 09/27/2020 2:32 PM) General Not Present- Appetite Loss, Chills, Fatigue, Fever, Night Sweats, Weight Gain and Weight Loss. Skin Not Present- Change in Wart/Mole, Dryness, Hives, Jaundice, New Lesions, Non-Healing Wounds, Rash and Ulcer. HEENT Present- Wears glasses/contact lenses. Not Present- Earache, Hearing Loss, Hoarseness, Nose Bleed, Oral Ulcers, Ringing in the Ears, Seasonal Allergies, Sinus Pain, Sore Throat, Visual Disturbances and Yellow Eyes. Respiratory Not Present- Bloody sputum, Chronic Cough, Difficulty Breathing, Snoring and Wheezing. Breast Present- Breast Mass. Not Present- Breast Pain, Nipple Discharge and Skin Changes. Cardiovascular Not Present- Chest Pain, Difficulty Breathing Lying Down, Leg Cramps, Palpitations, Rapid Heart Rate, Shortness of Breath and Swelling of Extremities. Gastrointestinal Not Present- Abdominal Pain, Bloating, Bloody Stool, Change in Bowel Habits, Chronic diarrhea, Constipation, Difficulty Swallowing, Excessive gas, Gets full quickly at meals, Hemorrhoids, Indigestion, Nausea, Rectal Pain and Vomiting. Female Genitourinary Not Present- Frequency, Nocturia, Painful Urination, Pelvic Pain and Urgency. Musculoskeletal Not Present- Back Pain, Joint Pain, Joint Stiffness, Muscle Pain, Muscle Weakness and Swelling of Extremities. Neurological Not Present- Decreased Memory, Fainting, Headaches, Numbness, Seizures, Tingling, Tremor, Trouble walking and Weakness. Psychiatric Not Present- Anxiety, Bipolar, Change in Sleep Pattern, Depression, Fearful and Frequent crying. Endocrine Not Present- Cold Intolerance, Excessive Hunger, Hair Changes, Heat Intolerance, Hot flashes and New Diabetes. Hematology Not Present- Blood Thinners, Easy Bruising, Excessive bleeding, Gland problems,  HIV and Persistent Infections.  Vitals Jess Barters CMA; 09/27/2020 2:34 PM) 09/27/2020 2:33 PM Weight:  151.19 lb Height: 63in Body Surface Area: 1.72 m Body Mass Index: 26.78 kg/m  Temp.: 98.74F  Pulse: 107 (Regular)  P.OX: 97% (Room air) BP: 120/80(Sitting, Left Arm, Standard)  Physical Exam Rolm Bookbinder MD; 09/27/2020 2:44 PM) General Mental Status-Alert. Orientation-Oriented X3. Breast Nipples-No Discharge. Breast Lump-No Palpable Breast Mass. Lymphatic Head & Neck General Head & Neck Lymphatics: Bilateral - Description - Normal. Axillary General Axillary Region: Bilateral - Description - Normal. Note: no [place] adenopathy   Assessment & Plan Rolm Bookbinder MD; 09/27/2020 3:32 PM) BREAST CANCER, LEFT (C50.912) left breast lumpectomy, left axillary sn biopsy We discussed the staging and pathophysiology of breast cancer. We discussed all of the different options for treatment for breast cancer including surgery, chemotherapy, radiation therapy, Herceptin, and antiestrogen therapy. We discussed a sentinel lymph node biopsy as she does not appear to having lymph node involvement right now. We discussed the performance of that with injection of radioactive tracer. We discussed that there is a chance of having a positive node with a sentinel lymph node biopsy and we will await the permanent pathology to make any other first further decisions in terms of her treatment. We discussed up to a 5% risk lifetime of chronic shoulder pain as well as lymphedema associated with a sentinel lymph node biopsy. We discussed the options for treatment of the breast cancer which included lumpectomy versus a mastectomy. We discussed the performance of the lumpectomy with radioactive seed placement. We discussed a 5-10% chance of a positive margin requiring reexcision in the operating room. We also discussed that she will need radiation therapy if she undergoes lumpectomy. We discussed  mastectomy and the postoperative care for that as well. Mastectomy can be followed by reconstruction. The decision for lumpectomy vs mastectomy has no impact on decision for chemotherapy. Most mastectomy patients will not need radiation therapy. We discussed that there is no difference in her survival whether she undergoes lumpectomy with radiation therapy or antiestrogen therapy versus a mastectomy. There is also no real difference between her recurrence in the breast. We discussed the risks of operation including bleeding, infection, possible reoperation. She understands her further therapy will be based on what her stages at the time of her operation.

## 2020-10-15 NOTE — Anesthesia Procedure Notes (Signed)
Anesthesia Regional Block: Pectoralis block   Pre-Anesthetic Checklist: ,, timeout performed, Correct Patient, Correct Site, Correct Laterality, Correct Procedure, Correct Position, site marked, Risks and benefits discussed,  Surgical consent,  Pre-op evaluation,  At surgeon's request and post-op pain management  Laterality: Left  Prep: chloraprep       Needles:  Injection technique: Single-shot  Needle Type: Echogenic Needle     Needle Length: 9cm  Needle Gauge: 21     Additional Needles:   Narrative:  Start time: 10/15/2020 8:10 AM End time: 10/15/2020 8:20 AM Injection made incrementally with aspirations every 5 mL.  Performed by: Personally  Anesthesiologist: Roberts Gaudy, MD  Additional Notes: 30 cc 0.25% Bupivacaine with 1:200 Epi 10 cc 1.3% Exparel injected easily

## 2020-10-18 ENCOUNTER — Other Ambulatory Visit: Payer: BC Managed Care – PPO

## 2020-10-19 ENCOUNTER — Encounter (HOSPITAL_BASED_OUTPATIENT_CLINIC_OR_DEPARTMENT_OTHER): Payer: Self-pay | Admitting: General Surgery

## 2020-10-19 LAB — SURGICAL PATHOLOGY

## 2020-10-21 ENCOUNTER — Telehealth: Payer: Self-pay | Admitting: *Deleted

## 2020-10-21 ENCOUNTER — Encounter: Payer: Self-pay | Admitting: *Deleted

## 2020-10-21 NOTE — Telephone Encounter (Signed)
Ordered oncotype per Dr. Gudena. Faxed requisition to pathology. °

## 2020-10-25 ENCOUNTER — Inpatient Hospital Stay: Payer: BC Managed Care – PPO | Admitting: Hematology and Oncology

## 2020-11-02 ENCOUNTER — Telehealth: Payer: Self-pay | Admitting: *Deleted

## 2020-11-02 NOTE — Telephone Encounter (Signed)
Pt called to r/s appt to 5/17 to discuss oncotype results with Dr. Lindi Adie Confirmed appt for 10:45am

## 2020-11-04 ENCOUNTER — Telehealth: Payer: Self-pay | Admitting: *Deleted

## 2020-11-04 ENCOUNTER — Encounter: Payer: Self-pay | Admitting: Hematology and Oncology

## 2020-11-04 ENCOUNTER — Encounter: Payer: Self-pay | Admitting: *Deleted

## 2020-11-04 ENCOUNTER — Other Ambulatory Visit: Payer: Self-pay | Admitting: Hematology and Oncology

## 2020-11-04 DIAGNOSIS — Z170 Estrogen receptor positive status [ER+]: Secondary | ICD-10-CM

## 2020-11-04 DIAGNOSIS — C50512 Malignant neoplasm of lower-outer quadrant of left female breast: Secondary | ICD-10-CM

## 2020-11-04 NOTE — Telephone Encounter (Signed)
Received oncotype results of 16/4%. Patient is aware that she does not require chemo.  Referral has been placed for xrt MD.

## 2020-11-05 ENCOUNTER — Encounter (HOSPITAL_COMMUNITY): Payer: Self-pay

## 2020-11-05 ENCOUNTER — Ambulatory Visit
Admission: RE | Admit: 2020-11-05 | Discharge: 2020-11-05 | Disposition: A | Discharge status: Home / Self Care | Payer: BC Managed Care – PPO | Source: Ambulatory Visit | Attending: Radiation Oncology | Admitting: Radiation Oncology

## 2020-11-05 ENCOUNTER — Encounter: Payer: Self-pay | Admitting: Radiation Oncology

## 2020-11-05 DIAGNOSIS — Z170 Estrogen receptor positive status [ER+]: Secondary | ICD-10-CM

## 2020-11-05 DIAGNOSIS — C50512 Malignant neoplasm of lower-outer quadrant of left female breast: Secondary | ICD-10-CM

## 2020-11-05 NOTE — Progress Notes (Signed)
Radiation Oncology         [phone] ________________________________  Initial Outpatient Consultation by telephone.  The patient opted for telemedicine to maximize safety during the pandemic.  MyChart video was not obtainable.   Name: Amy Stephenson MRN: [id number]  Date: 11/05/2020  DOB: May 15, 1966  XM:IWOE, Herbie [place], MD  Nicholas Lose, MD   REFERRING PHYSICIAN: Nicholas Lose, MD  DIAGNOSIS:    ICD-10-CM   1. 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 female who presented with left breast abnormality on the following imaging: bilateral screening mammogram. No symptoms were reported at that time. Ultrasound of the left breast on 09/09/2020 revealed a highly suspicious 1.2 cm mass in the left breast at the 5 o'clock position. The mass appeared to abut the chest wall and the full posterior margin was not visualized due to its far posterior location. There was no evidence of left axillary lymphadenopathy. Biopsy on the date of 09/10/2020 showed invasive mammary carcinoma. E-cadherin was positive, supporting a ductal origin. ER status: >95% moderat4e; PR status: 90% strong; Her2 status: negative; Grade: 1-2.  The patient underwent an MRI of bilateral breasts on 09/16/2020 that showed an indeterminate enhancing 6 mm mass in the upper outer quadrant of the left breast at anterior depth. It also showed an indeterminate possibly branching non-mass enhancement in the lower outer quadrant of the left breast at middle depth. Additionally, it showed the biopsy-proven invasive ductal carcinoma involving the lower outer quadrant of the left breast at posterior depth with a maximum measurement of 2.1 cm. There was no MRI  evidence of malignancy involving the right breast, nor was there any pathologic lymphadenopathy.  Additional biopsies on the date of 09/24/2020 were negative.  Breast/nodal surgery on the date of 10/15/2020 revealed: invasive ductal carcinoma spanning 1.9 cm. Margins were uninvolved by carcinoma; 0.4 cm from lateral margin. Nodal status of negative (0/1). ER status: >95% moderate; PR status: 90% strong; Her2 status: negative; Grade: 2.  Per Varney Biles Martini-Navigator: "Received oncotype results of 16/4%. Patient is aware that she does not require chemo.  Referral has been placed for xrt MD."   Lymphedema issues, if any:  Patient denies. Is scheduled to see PT next Wednesday and again in 3 months to assess/monitor     Pain issues, if any:  Reports numbness/tingling/aching down the lateral aspect of her left upper arm. States it does not limit her range of motion, but the burning sensation can be unsettling. She sees Dr Donne Hazel today.  She has good range of motion in her shoulders.  She has a fairly flexible schedule and does not work outside the home  She has a family history of diabetes and was diagnosed with type 2 diabetes herself.  She reports that her blood sugars are well controlled   PREVIOUS RADIATION THERAPY: No  PAST MEDICAL HISTORY:  has a past medical history of Diabetes mellitus without complication ([place]), Family history of breast cancer, Family history of colon cancer, Family history of prostate cancer, and Family history of stomach cancer.    PAST SURGICAL HISTORY: Past Surgical History:  Procedure Laterality Date  . BREAST LUMPECTOMY WITH RADIOACTIVE SEED AND SENTINEL LYMPH NODE BIOPSY Left 10/15/2020  Procedure: LEFT BREAST LUMPECTOMY WITH RADIOACTIVE SEED AND LEFT AXILLARY SENTINEL LYMPH NODE BIOPSY;  Surgeon: Rolm Bookbinder, MD;  Location: [facility];  Service: General;  Laterality: Left;  90 MINUTES ROOM 8  . CHOLECYSTECTOMY    . COLONOSCOPY       FAMILY HISTORY: family history includes Breast cancer in her cousin and maternal aunt; Breast cancer (age of onset: 53) in her mother; Colon cancer (age of onset: 80) in her father; Esophageal cancer in her paternal uncle; Prostate cancer (age of onset: 73) in her paternal grandfather; Stomach cancer (age of onset: 50) in her paternal grandmother.  SOCIAL HISTORY:  reports that she has never smoked. She has never used smokeless tobacco. She reports that she does not drink alcohol and does not use drugs.  ALLERGIES: Penicillins, Augmentin [amoxicillin-pot clavulanate], and Sulfa antibiotics  MEDICATIONS:  Current Outpatient Medications  Medication Sig Dispense Refill  . ALPRAZolam (XANAX) 0.25 MG tablet Take 0.25 mg by mouth as needed.    . Dapagliflozin-metFORMIN HCl ER 03-999 MG TB24 Xigduo XR 10 mg-1,000 mg tablet,extended release    . escitalopram (LEXAPRO) 10 MG tablet Take 1 tablet (10 mg total) by mouth daily. Needs ov. Thanks. 90 tablet 0  . glipiZIDE (GLUCOTROL XL) 5 MG 24 hr tablet Take 5 mg by mouth daily with breakfast.    . MULTIPLE VITAMIN PO Take 0.5 tablets by mouth.     . SitaGLIPtin-MetFORMIN HCl [phone] MG TB24 Janumet XR 100 mg-1,000 mg tablet,extended release    . traMADol (ULTRAM) 50 MG tablet Take 1 tablet (50 mg total) by mouth every 6 (six) hours as needed. 10 tablet 0  . valACYclovir (VALTREX) 1000 MG tablet Take 2 tablets by mouth daily.    Marland Kitchen zolpidem (AMBIEN) 10 MG tablet zolpidem 10 mg tablet     No current facility-administered medications for this encounter.    REVIEW OF SYSTEMS: As above   PHYSICAL EXAM:  vitals were not taken for this visit.   General: Alert and oriented, in no acute distress    LABORATORY DATA:  No results found for: WBC, HGB, HCT, MCV, PLT CMP     Component Value Date/Time   NA 138 10/06/2020 1430   K 4.0 10/06/2020 1430   CL 100 10/06/2020 1430   CO2 30 10/06/2020 1430   GLUCOSE 150 (H) 10/06/2020 1430   BUN 18  10/06/2020 1430   CREATININE 0.66 10/06/2020 1430   CALCIUM 9.7 10/06/2020 1430   GFRNONAA >60 10/06/2020 1430         RADIOGRAPHY: NM Sentinel Node Inj-No Rpt (Breast)  Addendum Date: 10/25/2020   Addendum: Sulfur Colloid was injected by the Nuclear Medicine Technologist for sentinel lymph node localization.   Result Date: 10/15/2020 Sulfur colloid was injected by the nuclear medicine technologist for melanoma sentinel node.   MM Breast Surgical Specimen  Result Date: 10/15/2020 CLINICAL DATA:  Biopsy-proven grade 1-2 invasive ductal carcinoma involving the LOWER OUTER QUADRANT of the LEFT breast. Radioactive seed localization was performed yesterday in anticipation of today's lumpectomy. EXAM: SPECIMEN RADIOGRAPH OF THE LEFT BREAST COMPARISON:  Previous exam(s). FINDINGS: Status post excision of the LEFT breast. The radioactive seed and the ribbon shaped tissue marker clip are present in the non-compressed specimen. The seed is intact. This was discussed by telephone with the operating room nurse at the time of interpretation on 10/15/2020 at approximately 9:30 a.m. IMPRESSION: Specimen radiograph of the LEFT breast. Electronically Signed   By: Sherran Needs.D.  On: 10/15/2020 09:46   [place] LT RADIOACTIVE SEED LOC  Result Date: 10/14/2020 CLINICAL DATA:  55 year old female for radioactive seed localization of LEFT breast cancer prior to lumpectomy. EXAM: ULTRASOUND GUIDED RADIOACTIVE SEED LOCALIZATION OF THE LEFT BREAST COMPARISON:  Previous exam(s). FINDINGS: Patient presents for radioactive seed localization prior to LEFT lumpectomy. I met with the patient and we discussed the procedure of seed localization including benefits and alternatives. We discussed the high likelihood of a successful procedure. We discussed the risks of the procedure including infection, bleeding, tissue injury and further surgery. We discussed the low dose of radioactivity involved in the procedure. Informed,  written consent was given. The usual time-out protocol was performed immediately prior to the procedure. Using ultrasound guidance, sterile technique, 1% lidocaine and an I-125 radioactive seed, the 1.2 cm mass at the 5 o'clock position of the LEFT breast 4 cm from the nipple was localized using a MEDIAL approach. The follow-up mammogram images confirm the seed in the expected location and were marked for Dr. Donne Hazel. Follow-up survey of the patient confirms presence of the radioactive seed. Order number of I-125 seed:  [id number]. Total activity:  8.101 millicuries.  Reference Date: 09/10/2020. The patient tolerated the procedure well and was released from the [facility]. She was given instructions regarding seed removal. IMPRESSION: Radioactive seed localization LEFT breast. No apparent complications. Electronically Signed   By: Margarette Canada M.D.   On: 10/14/2020 14:19   MM CLIP PLACEMENT LEFT  Result Date: 10/14/2020 CLINICAL DATA:  Evaluate seed placement following ultrasound-guided radioactive seed localization of LEFT breast cancer. EXAM: DIAGNOSTIC LEFT MAMMOGRAM POST ULTRASOUND-GUIDED RADIOACTIVE SEED PLACEMENT COMPARISON:  Previous exam(s). FINDINGS: Mammographic images were obtained following ultrasound-guided radioactive seed placement. On the 2D/3D CC view, there is appropriate positioning of the radioactive seed within the LEFT breast mass and adjacent to the RIBBON clip. Only the tips of the seed and RIBBON clip are noted on the LATERAL view due to far posterior position. IMPRESSION: Appropriate location of the radioactive seed within the LEFT breast. Final Assessment: Post Procedure Mammograms for Seed Placement Electronically Signed   By: Margarette Canada M.D.   On: 10/14/2020 14:22      IMPRESSION/PLAN: Left breast cancer  It was a pleasure meeting the patient today. We discussed the risks, benefits, and side effects of radiotherapy. I recommend radiotherapy to the left breast to reduce her  risk of locoregional recurrence by 2/3. We discussed that radiation would take approximately 4 weeks to complete. She is ready for treatment planning.   We spoke about acute effects including skin irritation and fatigue as well as much less common late effects including internal organ injury or irritation (i.e. the heart or lungs) as well as rib fracture.  We discussed long-term cosmetic changes including swelling and shrinkage of the breast that is possible  We spoke about the latest technology that is used to minimize the risk of late effects for patients undergoing radiotherapy to the breast or chest wall. No guarantees of treatment were given. The patient is enthusiastic about proceeding with treatment. I look forward to participating in the patient's care.   We will schedule a CT simulation in the near future.   This encounter was provided by telemedicine platform; patient desired telemedicine during pandemic precautions.  MyChart video was not available and therefore telephone was used. The patient has given verbal consent for this type of encounter and has been advised to only accept a meeting of this type in a secure  network environment. On date of service, in total, I spent 40 minutes on this encounter.   The attendants for this meeting include Eppie Gibson  and [name] During the encounter, Eppie Gibson was located at [facility].  [name] was located at home.     __________________________________________   Eppie Gibson, MD  This document serves as a record of services personally performed by Eppie Gibson, MD. It was created on his behalf by Clerance Lav, a trained medical scribe. The creation of this record is based on the scribe's personal observations and the provider's statements to them. This document has been checked and approved by the attending provider.

## 2020-11-05 NOTE — Progress Notes (Signed)
Location of Breast Cancer: Malignant neoplasm of lower-outer quadrant of LEFT breast, estrogen receptor positive   Histology per Pathology Report:  10/15/2020 FINAL MICROSCOPIC DIAGNOSIS:  A. BREAST, LEFT, LUMPECTOMY:  - Invasive ductal carcinoma, Nottingham grade 2 of 3, 1.9 cm  - Margins uninvolved by carcinoma (0.4 cm; lateral margin)  - Previous biopsy site changes present  - See oncology table below  B. LYMPH NODE, LEFT AXILLARY, SENTINEL, EXCISION:  - No carcinoma identified in one lymph node (0/1)  C. BREAST, LEFT SUPERIOR MARGIN, EXCISION:  - Usual ductal hyperplasia  - No residual carcinoma identified  - See comment  D. BREAST, LEFT LATERAL MARGIN, EXCISION:  - No residual carcinoma identified  COMMENT:  C. Cytokeratin 5/6 is positive and the focus of usual ductal  hyperplasia.   Receptor Status: ER(95%), PR (90%), Her2-neu (Negative via FISH), Ki-67(5%)  Did patient present with symptoms (if so, please note symptoms) or was this found on screening mammography?:  Screening mammogram showed a possible left breast mass, palpable on exam. Diagnostic mammogram and [place] on 09/09/20 showed a 1.2cm mass at the 5 o'clock position and no abnormal axillary lymph nodes.  Past/Anticipated interventions by surgeon, if any: 10/15/2020 Dr. Rolm Bookbinder 1.  Left breast radioactive seed guided lumpectomy 2.  Left deep axillary sentinel lymph node biopsy  Past/Anticipated interventions by medical oncology, if any:  Under care of Dr. Nicholas Lose 09/23/2020 Recommendations: 1. Breast conserving surgery with sentinel lymph node surgery (depending on the MRI biopsies and genetic testing) 2. Oncotype DX testing to determine if chemotherapy would be of any benefit followed by  --Per Varney Biles Martini-Navigator: "Received oncotype results of 16/4%. Patient is aware that she does not require chemo.  Referral has been placed for xrt MD." 3. Adjuvant radiation therapy if she undergoes  breast conserving surgery 4. Adjuvant antiestrogen therapy --Return to clinic after surgery to discuss final pathology report and then determine if Oncotype DX testing will need to be sent.  Lymphedema issues, if any:  Patient denies. Is scheduled to see PT next Wednesday and again in 3 months to assess/monitor     Pain issues, if any:  Reports numbness/tingling/aching down the lateral aspect of her left upper arm. States it does not limit her range of motion, but the burning sensation can be unsettling.   SAFETY ISSUES:  Prior radiation? No  Pacemaker/ICD? No  Possible current pregnancy? No--postmenopausal   Is the patient on methotrexate? No  Current Complaints / other details:  Patient has received the first 3 doses of the Pfizer vaccine

## 2020-11-08 ENCOUNTER — Telehealth: Payer: Self-pay | Admitting: *Deleted

## 2020-11-08 ENCOUNTER — Encounter: Payer: Self-pay | Admitting: *Deleted

## 2020-11-08 ENCOUNTER — Ambulatory Visit: Payer: BC Managed Care – PPO | Admitting: Hematology and Oncology

## 2020-11-08 NOTE — Telephone Encounter (Signed)
Spoke with patient concerning her appointment 5/17 with Dr. Lindi Adie.  She would rather wait till the last week of xrt for a visit.  Appointment cancelled and informed her we would r/s for the last week of xrt.  Patient is also requesting that an addendum be made from a note from [place] that her cancer is IA versus IB.  I will let lindsey know to make this change.

## 2020-11-09 ENCOUNTER — Inpatient Hospital Stay: Payer: BC Managed Care – PPO | Admitting: Hematology and Oncology

## 2020-11-10 ENCOUNTER — Ambulatory Visit: Payer: BC Managed Care – PPO | Attending: General Surgery | Admitting: Physical Therapy

## 2020-11-10 ENCOUNTER — Other Ambulatory Visit: Payer: Self-pay

## 2020-11-10 ENCOUNTER — Ambulatory Visit
Admission: RE | Admit: 2020-11-10 | Discharge: 2020-11-10 | Disposition: A | Discharge status: Home / Self Care | Payer: BC Managed Care – PPO | Source: Ambulatory Visit | Attending: Radiation Oncology | Admitting: Radiation Oncology

## 2020-11-10 DIAGNOSIS — C50512 Malignant neoplasm of lower-outer quadrant of left female breast: Secondary | ICD-10-CM | POA: Insufficient documentation

## 2020-11-10 DIAGNOSIS — Z483 Aftercare following surgery for neoplasm: Secondary | ICD-10-CM | POA: Insufficient documentation

## 2020-11-10 DIAGNOSIS — Z510 Encounter for antineoplastic radiation therapy: Secondary | ICD-10-CM | POA: Insufficient documentation

## 2020-11-10 DIAGNOSIS — Z170 Estrogen receptor positive status [ER+]: Secondary | ICD-10-CM | POA: Diagnosis not present

## 2020-11-10 NOTE — Patient Instructions (Signed)

## 2020-11-10 NOTE — Therapy (Signed)
[place], [place], [address] Phone: [phone]   Fax:  [phone]  Physical Therapy Treatment  Patient Details  Name: Amy Stephenson MRN: [id number] Date of Birth: [birth year] Referring Provider (PT): Dr. Donne Hazel   Encounter Date: 11/10/2020   PT End of Session - 11/10/20 1548    Visit Number 2    Number of Visits 2    Date for PT Re-Evaluation 11/23/20    Activity Tolerance Patient limited by pain    Behavior During Therapy [facility] for tasks assessed/performed           Past Medical History:  Diagnosis Date  . Diabetes mellitus without complication ([place])   . Family history of breast cancer   . Family history of colon cancer   . Family history of prostate cancer   . Family history of stomach cancer     Past Surgical History:  Procedure Laterality Date  . BREAST LUMPECTOMY WITH RADIOACTIVE SEED AND SENTINEL LYMPH NODE BIOPSY Left 10/15/2020   Procedure: LEFT BREAST LUMPECTOMY WITH RADIOACTIVE SEED AND LEFT AXILLARY SENTINEL LYMPH NODE BIOPSY;  Surgeon: Rolm Bookbinder, MD;  Location: [facility];  Service: General;  Laterality: Left;  90 MINUTES ROOM 8  . CHOLECYSTECTOMY    . COLONOSCOPY      There were no vitals filed for this visit.   Subjective Assessment - 11/10/20 1513    Subjective pt is having "nerve pain" its a dull steay 5/10 all the time, she stopped at Dr. Cristal Generous office and got some gabapentin. She had the simulation for radiation today and will start Wednesday the 25th    Pertinent History left breast cancer found on mammogram 08/2020   She does not know if she will need to have radiation or chemo .  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 Yes    Pain Score 5               [facility] PT Assessment - 11/10/20 0001       AROM   Left Shoulder Flexion 170 Degrees    Left Shoulder ABduction 165 Degrees             LYMPHEDEMA/ONCOLOGY QUESTIONNAIRE - 11/10/20 0001      Left Upper Extremity Lymphedema   10 cm Proximal to Olecranon Process 26 cm    Olecranon Process 23 cm    10 cm Proximal to Ulnar Styloid Process 21 cm    Just Proximal to Ulnar Styloid Process 15.5 cm    Across Hand at [employer] 18 cm    At [place] of 2nd Digit 5.8 cm                      [facility] Adult PT Treatment/Exercise - 11/10/20 0001      Exercises   Exercises Other Exercises    Other Exercises  Meeks decompression with return demonstration by patient.  She was instructed to use a small pillow just big enough to fill up the space between the back of her head and the mattress. She did not have the nerve pain in this position.                  PT Education - 11/10/20 1547    Education Details  meeks decompression exercises    Person(s) Educated Patient    Methods Explanation;Demonstration;Handout    Comprehension Verbalized understanding;Returned demonstration                Breast Clinic Goals - 11/10/20 1557      Patient will be able to verbalize understanding of pertinent lymphedema risk reduction practices relevant to her diagnosis specifically related to skin care.   Status Achieved      Patient will be able to return demonstrate and/or verbalize understanding of the post-op home exercise program related to regaining shoulder range of motion.   Status Achieved      Patient will be able to verbalize understanding of the importance of attending the postoperative After Breast Cancer Class for further lymphedema risk reduction education and therapeutic exercise.   Status Achieved                Plan - 11/10/20 1548    Clinical Impression Statement Pt comes in today for 3 week post op check.  She has returned to baseline ROM and shows no signs of lympedema. She is very upset by a  persistent pain that she has down into her left upper arm that she says is "nerve pain" She describes it as burning. She also reports a past history of "cysts" in her neck and has had a similar type of pain before surgery at sometimes when waking up from sleep with certain pillow. Pt will try the Meeks decompression exercises and use the gabapentin in hopes that will help her pain. She does not need further PT at this time, but will call if problems persist and she feels she needs more PT Will discharge from PT at this time.    Personal Factors and Comorbidities Comorbidity 2    Comorbidities left breast cancer, DM    Stability/Clinical Decision Making Stable/Uncomplicated    Rehab Potential Excellent    PT Frequency Other (comment)    PT Treatment/Interventions ADLs/Self Care Home Management;Therapeutic exercise;Therapeutic activities;Patient/family education    PT Next Visit Plan Dischage from PT. Continue to follow for SOZO screens.    Consulted and Agree with Plan of Care Patient           Patient will benefit from skilled therapeutic intervention in order to improve the following deficits and impairments:     Visit Diagnosis: Aftercare following surgery for neoplasm     Problem List Patient Active Problem List   Diagnosis Date Noted  . Genetic testing 10/04/2020  . Family history of breast cancer   . Family history of colon cancer   . Family history of prostate cancer   . Family history of stomach cancer   . Malignant neoplasm of lower-outer quadrant of left breast of female, estrogen receptor positive ([place]) 09/22/2020  . Anxiety 09/07/2015   PHYSICAL THERAPY DISCHARGE SUMMARY  Visits from Start of Care: 2  Current functional level related to goals / functional outcomes: Independent    Remaining deficits: Pain in left arm   Education / Equipment: Home exercise  Plan: Patient agrees to discharge.  Patient goals were met. Patient is being discharged due to                                                      ?????    Donato Heinz. Owens Shark, PT  [place] Levo 11/10/2020, 3:58 PM  [place], [place], [address] Phone: [phone]   Fax:  [phone]  Name: Amy Stephenson MRN: [id number] Date of Birth: 04/21/1966

## 2020-11-12 ENCOUNTER — Telehealth: Payer: Self-pay | Admitting: *Deleted

## 2020-11-12 NOTE — Telephone Encounter (Signed)
Called and spoke with patient regarding her staging. After reviewing her chart and imaging I explained to her that there is a clinical stage that is based on the size with imaging and that in her MRI her tumor was 2.1cm which she would be a T2 and therefore IB for staging. I then explained that after surgery there is also a pathological stage based on her residual tumor after sx and that was T1c which would then be IA stage. She verbalized understanding. Encouraged her to call should she have any more questions.

## 2020-11-15 DIAGNOSIS — C50512 Malignant neoplasm of lower-outer quadrant of left female breast: Secondary | ICD-10-CM | POA: Diagnosis not present

## 2020-11-16 ENCOUNTER — Encounter: Payer: Self-pay | Admitting: *Deleted

## 2020-11-17 ENCOUNTER — Other Ambulatory Visit: Payer: Self-pay

## 2020-11-17 ENCOUNTER — Ambulatory Visit
Admission: RE | Admit: 2020-11-17 | Discharge: 2020-11-17 | Disposition: A | Discharge status: Home / Self Care | Payer: BC Managed Care – PPO | Source: Ambulatory Visit | Attending: Radiation Oncology | Admitting: Radiation Oncology

## 2020-11-17 DIAGNOSIS — C50512 Malignant neoplasm of lower-outer quadrant of left female breast: Secondary | ICD-10-CM | POA: Diagnosis not present

## 2020-11-18 ENCOUNTER — Ambulatory Visit
Admission: RE | Admit: 2020-11-18 | Discharge: 2020-11-18 | Disposition: A | Discharge status: Home / Self Care | Payer: BC Managed Care – PPO | Source: Ambulatory Visit | Attending: Radiation Oncology | Admitting: Radiation Oncology

## 2020-11-18 ENCOUNTER — Other Ambulatory Visit: Payer: Self-pay

## 2020-11-18 DIAGNOSIS — C50512 Malignant neoplasm of lower-outer quadrant of left female breast: Secondary | ICD-10-CM | POA: Diagnosis not present

## 2020-11-19 ENCOUNTER — Ambulatory Visit
Admission: RE | Admit: 2020-11-19 | Discharge: 2020-11-19 | Disposition: A | Discharge status: Home / Self Care | Payer: BC Managed Care – PPO | Source: Ambulatory Visit | Attending: Radiation Oncology | Admitting: Radiation Oncology

## 2020-11-19 ENCOUNTER — Other Ambulatory Visit: Payer: Self-pay

## 2020-11-19 DIAGNOSIS — C50512 Malignant neoplasm of lower-outer quadrant of left female breast: Secondary | ICD-10-CM | POA: Diagnosis not present

## 2020-11-23 ENCOUNTER — Other Ambulatory Visit: Payer: Self-pay

## 2020-11-23 ENCOUNTER — Ambulatory Visit
Admission: RE | Admit: 2020-11-23 | Discharge: 2020-11-23 | Disposition: A | Discharge status: Home / Self Care | Payer: BC Managed Care – PPO | Source: Ambulatory Visit | Attending: Radiation Oncology | Admitting: Radiation Oncology

## 2020-11-23 DIAGNOSIS — C50512 Malignant neoplasm of lower-outer quadrant of left female breast: Secondary | ICD-10-CM

## 2020-11-23 DIAGNOSIS — Z170 Estrogen receptor positive status [ER+]: Secondary | ICD-10-CM

## 2020-11-23 MED ORDER — RADIAPLEXRX EX GEL
1.0000 "application " | Freq: Once | CUTANEOUS | Status: AC
  Administered 2020-11-23: 1 via TOPICAL

## 2020-11-23 MED ORDER — ALRA NON-METALLIC DEODORANT (RAD-ONC)
1.0000 | Freq: Once | TOPICAL | Status: AC
Start: 2020-11-23 — End: 2020-11-23
  Administered 2020-11-23: 1 via TOPICAL

## 2020-11-24 ENCOUNTER — Other Ambulatory Visit: Payer: Self-pay

## 2020-11-24 ENCOUNTER — Ambulatory Visit
Admission: RE | Admit: 2020-11-24 | Discharge: 2020-11-24 | Disposition: A | Discharge status: Home / Self Care | Payer: BC Managed Care – PPO | Source: Ambulatory Visit | Attending: Radiation Oncology | Admitting: Radiation Oncology

## 2020-11-24 DIAGNOSIS — Z170 Estrogen receptor positive status [ER+]: Secondary | ICD-10-CM | POA: Diagnosis not present

## 2020-11-24 DIAGNOSIS — Z510 Encounter for antineoplastic radiation therapy: Secondary | ICD-10-CM | POA: Diagnosis present

## 2020-11-24 DIAGNOSIS — C50512 Malignant neoplasm of lower-outer quadrant of left female breast: Secondary | ICD-10-CM | POA: Diagnosis present

## 2020-11-25 ENCOUNTER — Ambulatory Visit
Admission: RE | Admit: 2020-11-25 | Discharge: 2020-11-25 | Disposition: A | Discharge status: Home / Self Care | Payer: BC Managed Care – PPO | Source: Ambulatory Visit | Attending: Radiation Oncology | Admitting: Radiation Oncology

## 2020-11-25 DIAGNOSIS — C50512 Malignant neoplasm of lower-outer quadrant of left female breast: Secondary | ICD-10-CM | POA: Diagnosis not present

## 2020-11-26 ENCOUNTER — Other Ambulatory Visit: Payer: Self-pay

## 2020-11-26 ENCOUNTER — Ambulatory Visit
Admission: RE | Admit: 2020-11-26 | Discharge: 2020-11-26 | Disposition: A | Discharge status: Home / Self Care | Payer: BC Managed Care – PPO | Source: Ambulatory Visit | Attending: Radiation Oncology | Admitting: Radiation Oncology

## 2020-11-26 DIAGNOSIS — C50512 Malignant neoplasm of lower-outer quadrant of left female breast: Secondary | ICD-10-CM | POA: Diagnosis not present

## 2020-11-29 ENCOUNTER — Other Ambulatory Visit: Payer: Self-pay

## 2020-11-29 ENCOUNTER — Ambulatory Visit
Admission: RE | Admit: 2020-11-29 | Discharge: 2020-11-29 | Disposition: A | Discharge status: Home / Self Care | Payer: BC Managed Care – PPO | Source: Ambulatory Visit | Attending: Radiation Oncology | Admitting: Radiation Oncology

## 2020-11-29 DIAGNOSIS — C50512 Malignant neoplasm of lower-outer quadrant of left female breast: Secondary | ICD-10-CM | POA: Diagnosis not present

## 2020-11-30 ENCOUNTER — Ambulatory Visit
Admission: RE | Admit: 2020-11-30 | Discharge: 2020-11-30 | Disposition: A | Discharge status: Home / Self Care | Payer: BC Managed Care – PPO | Source: Ambulatory Visit | Attending: Radiation Oncology | Admitting: Radiation Oncology

## 2020-11-30 DIAGNOSIS — C50512 Malignant neoplasm of lower-outer quadrant of left female breast: Secondary | ICD-10-CM | POA: Diagnosis not present

## 2020-12-01 ENCOUNTER — Encounter: Payer: Self-pay | Admitting: *Deleted

## 2020-12-01 ENCOUNTER — Telehealth: Payer: Self-pay | Admitting: Hematology and Oncology

## 2020-12-01 ENCOUNTER — Ambulatory Visit
Admission: RE | Admit: 2020-12-01 | Discharge: 2020-12-01 | Disposition: A | Discharge status: Home / Self Care | Payer: BC Managed Care – PPO | Source: Ambulatory Visit | Attending: Radiation Oncology | Admitting: Radiation Oncology

## 2020-12-01 DIAGNOSIS — C50512 Malignant neoplasm of lower-outer quadrant of left female breast: Secondary | ICD-10-CM | POA: Diagnosis not present

## 2020-12-01 NOTE — Telephone Encounter (Signed)
Sch per 6/8 sch msg, pt aware.

## 2020-12-02 ENCOUNTER — Ambulatory Visit
Admission: RE | Admit: 2020-12-02 | Discharge: 2020-12-02 | Disposition: A | Discharge status: Home / Self Care | Payer: BC Managed Care – PPO | Source: Ambulatory Visit | Attending: Radiation Oncology | Admitting: Radiation Oncology

## 2020-12-02 ENCOUNTER — Other Ambulatory Visit: Payer: Self-pay

## 2020-12-02 DIAGNOSIS — C50512 Malignant neoplasm of lower-outer quadrant of left female breast: Secondary | ICD-10-CM | POA: Diagnosis not present

## 2020-12-03 ENCOUNTER — Ambulatory Visit
Admission: RE | Admit: 2020-12-03 | Discharge: 2020-12-03 | Disposition: A | Discharge status: Home / Self Care | Payer: BC Managed Care – PPO | Source: Ambulatory Visit | Attending: Radiation Oncology | Admitting: Radiation Oncology

## 2020-12-03 DIAGNOSIS — C50512 Malignant neoplasm of lower-outer quadrant of left female breast: Secondary | ICD-10-CM | POA: Diagnosis not present

## 2020-12-06 ENCOUNTER — Ambulatory Visit
Admission: RE | Admit: 2020-12-06 | Discharge: 2020-12-06 | Disposition: A | Discharge status: Home / Self Care | Payer: BC Managed Care – PPO | Source: Ambulatory Visit | Attending: Radiation Oncology | Admitting: Radiation Oncology

## 2020-12-06 ENCOUNTER — Ambulatory Visit: Payer: BC Managed Care – PPO | Admitting: Radiation Oncology

## 2020-12-06 ENCOUNTER — Other Ambulatory Visit: Payer: Self-pay

## 2020-12-06 ENCOUNTER — Encounter: Payer: Self-pay | Admitting: *Deleted

## 2020-12-06 DIAGNOSIS — C50512 Malignant neoplasm of lower-outer quadrant of left female breast: Secondary | ICD-10-CM | POA: Diagnosis not present

## 2020-12-07 ENCOUNTER — Other Ambulatory Visit: Payer: Self-pay

## 2020-12-07 ENCOUNTER — Ambulatory Visit
Admission: RE | Admit: 2020-12-07 | Discharge: 2020-12-07 | Disposition: A | Discharge status: Home / Self Care | Payer: BC Managed Care – PPO | Source: Ambulatory Visit | Attending: Radiation Oncology | Admitting: Radiation Oncology

## 2020-12-07 DIAGNOSIS — C50512 Malignant neoplasm of lower-outer quadrant of left female breast: Secondary | ICD-10-CM | POA: Diagnosis not present

## 2020-12-08 ENCOUNTER — Ambulatory Visit
Admission: RE | Admit: 2020-12-08 | Discharge: 2020-12-08 | Disposition: A | Discharge status: Home / Self Care | Payer: BC Managed Care – PPO | Source: Ambulatory Visit | Attending: Radiation Oncology | Admitting: Radiation Oncology

## 2020-12-08 DIAGNOSIS — C50512 Malignant neoplasm of lower-outer quadrant of left female breast: Secondary | ICD-10-CM | POA: Diagnosis not present

## 2020-12-09 ENCOUNTER — Other Ambulatory Visit: Payer: Self-pay

## 2020-12-09 ENCOUNTER — Ambulatory Visit
Admission: RE | Admit: 2020-12-09 | Discharge: 2020-12-09 | Disposition: A | Discharge status: Home / Self Care | Payer: BC Managed Care – PPO | Source: Ambulatory Visit | Attending: Radiation Oncology | Admitting: Radiation Oncology

## 2020-12-09 DIAGNOSIS — C50512 Malignant neoplasm of lower-outer quadrant of left female breast: Secondary | ICD-10-CM | POA: Diagnosis not present

## 2020-12-10 ENCOUNTER — Ambulatory Visit
Admission: RE | Admit: 2020-12-10 | Discharge: 2020-12-10 | Disposition: A | Discharge status: Home / Self Care | Payer: BC Managed Care – PPO | Source: Ambulatory Visit | Attending: Radiation Oncology | Admitting: Radiation Oncology

## 2020-12-10 DIAGNOSIS — C50512 Malignant neoplasm of lower-outer quadrant of left female breast: Secondary | ICD-10-CM | POA: Diagnosis not present

## 2020-12-11 NOTE — Progress Notes (Signed)
Patient Care Team: Vania Rea, MD as PCP - General (Obstetrics and Gynecology) Mauro Kaufmann, RN as Oncology Nurse Navigator Rockwell Germany, RN as Oncology Nurse Navigator  DIAGNOSIS:    ICD-10-CM   1. Malignant neoplasm of lower-outer quadrant of left breast of female, estrogen receptor positive ([place])  C50.512    Z17.0       SUMMARY OF ONCOLOGIC HISTORY: Oncology History  Malignant neoplasm of lower-outer quadrant of left breast of female, estrogen receptor positive ([place])  09/10/2020 Initial Diagnosis   Screening mammogram showed a possible left breast mass, palpable on exam. Diagnostic mammogram and US showed a 1.2cm mass at the 5 o'clock position and no abnormal axillary lymph nodes. Biopsy showed invasive mammary carcinoma, grade 1-2, HER-2 equivocal by IHC (2+), negative by FISH (ratio 1.36), ER+>95%, PR+ 90%, Ki67 5%. B   09/10/2020 Cancer Staging   Staging form: Breast, AJCC 8th Edition - Clinical stage from 09/10/2020: Stage IB (cT2, cN0, cM0, G2, ER+, PR+, HER2-) - Signed by Gardenia Phlegm, NP on 09/22/2020  Stage prefix: Initial diagnosis  Histologic grading system: 3 grade system    09/16/2020 Breast MRI   0.6cm mass in the upper outer quadrant, non-mass enhancement in the low outer quadrant, and the known malignancy spanning 2.1cm in the left breast with no evidence of right breast malignancy.    Genetic Testing   Negative genetic testing. No pathogenic variants identified on the [facility] STAT+Multi-Cancer Panel. The report date is 10/04/2020.  The STAT Breast cancer panel offered by [facility] includes sequencing and rearrangement analysis for the following 9 genes:  ATM, BRCA1, BRCA2, CDH1, CHEK2, PALB2, PTEN, STK11 and TP53.    The Multi-Cancer Panel + RNA offered by [facility] includes sequencing and/or deletion duplication testing of the following 84 genes: AIP, ALK, APC, ATM, AXIN2,BAP1,  BARD1, BLM, BMPR1A, BRCA1, BRCA2, BRIP1, CASR, CDC73, CDH1, CDK4,  CDKN1B, CDKN1C, CDKN2A (p14ARF), CDKN2A (p16INK4a), CEBPA, CHEK2, CTNNA1, DICER1, DIS3L2, EGFR (c.2369C>T, p.Thr790Met variant only), EPCAM (Deletion/duplication testing only), FH, FLCN, GATA2, GPC3, GREM1 (Promoter region deletion/duplication testing only), HOXB13 (c.251G>A, p.Gly84Glu), HRAS, KIT, MAX, MEN1, MET, MITF (c.952G>A, p.Glu318Lys variant only), MLH1, MSH2, MSH3, MSH6, MUTYH, NBN, NF1, NF2, NTHL1, PALB2, PDGFRA, PHOX2B, PMS2, POLD1, POLE, POT1, PRKAR1A, PTCH1, PTEN, RAD50, RAD51C, RAD51D, RB1, RECQL4, RET, RUNX1, SDHAF2, SDHA (sequence changes only), SDHB, SDHC, SDHD, SMAD4, SMARCA4, SMARCB1, SMARCE1, STK11, SUFU, TERC, TERT, TMEM127, TP53, TSC1, TSC2, VHL, WRN and WT1.   11/04/2020 Cancer Staging   Staging form: Breast, AJCC 8th Edition - Pathologic: Stage IA (pT1c, pN0, cM0, G2, ER+, PR+, HER2-) - Signed by Nicholas Lose, MD on 11/04/2020  Stage prefix: Initial diagnosis  Histologic grading system: 3 grade system      CHIEF COMPLIANT: Follow-up for left breast cancer  INTERVAL HISTORY: Amy Stephenson is a 55 y.o. with above-mentioned history of invasive mammary carcinoma of the left breast. MRI left breast biopsy on 10/15/2020 showed invasive ductal carcinoma, Nottingham grade 2 of 3, 1.9 cm, margins uninvolved by carcinoma (0.4 cm; lateral margin) of the left breast, and no carcinoma of the left axillary lymph node. She reports to the clinic today for follow-up.  She is doing extremely well other than very mild radiation dermatitis.  ALLERGIES:  is allergic to penicillins, augmentin [amoxicillin-pot clavulanate], and sulfa antibiotics.  MEDICATIONS:  Current Outpatient Medications  Medication Sig Dispense Refill   [START ON 01/12/2021] anastrozole (ARIMIDEX) 1 MG tablet Take 1 tablet (1 mg total) by mouth daily. 90 tablet 3  ALPRAZolam (XANAX) 0.25 MG tablet Take 0.25 mg by mouth as needed.     Dapagliflozin-metFORMIN HCl ER 03-999 MG TB24 Xigduo XR 10 mg-1,000 mg  tablet,extended release     escitalopram (LEXAPRO) 10 MG tablet Take 1 tablet (10 mg total) by mouth daily. Needs ov. Thanks. 90 tablet 0   glipiZIDE (GLUCOTROL XL) 5 MG 24 hr tablet Take 5 mg by mouth daily with breakfast.     MULTIPLE VITAMIN PO Take 0.5 tablets by mouth.      SitaGLIPtin-MetFORMIN HCl [phone] MG TB24 Janumet XR 100 mg-1,000 mg tablet,extended release     zolpidem (AMBIEN) 10 MG tablet zolpidem 10 mg tablet     No current facility-administered medications for this visit.    PHYSICAL EXAMINATION: ECOG PERFORMANCE STATUS: 1 - Symptomatic but completely ambulatory  Vitals:   12/13/20 1054  BP: 103/70  Pulse: 78  Resp: 18  Temp: 97.8 F (36.6 C)  SpO2: 100%   Filed Weights   12/13/20 1054  Weight: 150 lb 4.8 oz (68.2 kg)       LABORATORY DATA:  I have reviewed the data as listed CMP Latest Ref Rng & Units 10/06/2020  Glucose 70 - 99 mg/dL 150(H)  BUN 6 - 20 mg/dL 18  Creatinine 0.44 - 1.00 mg/dL 0.66  Sodium 135 - 145 mmol/L 138  Potassium 3.5 - 5.1 mmol/L 4.0  Chloride 98 - 111 mmol/L 100  CO2 22 - 32 mmol/L 30  Calcium 8.9 - 10.3 mg/dL 9.7    No results found for: WBC, HGB, HCT, MCV, PLT, NEUTROABS  ASSESSMENT & PLAN:  Malignant neoplasm of lower-outer quadrant of left breast of female, estrogen receptor positive ([place]) 09/10/2020: Screening mammogram detected left breast mass later palpable, 1.2 cm at 5 o'clock position, axilla negative, biopsy: Grade 1-2 IDC, ER greater than 95%, PR 90%, Ki-67 5%, HER-2 negative ratio 1.36   09/16/2020: Breast MRI: 0.6 cm mass UOQ, non-mass enhancement LOQ spanning 2.1 cm, MRI guided biopsy is recommended for the indeterminate mass in the non-mass enhancement.  10/15/20: Left Lumpectomy: 1.9 cm Grade 2 IDC , Margins Neg, ER greater than 95%, PR 90%, Ki-67 5%, HER-2 negative ratio 1.36  Oncotype Dx: Score 16, ROR 4% Adj XRT 11/18/20- 12/15/20  Anastrozole counseling: We discussed the risks and benefits of  anti-estrogen therapy with aromatase inhibitors. These include but not limited to insomnia, hot flashes, mood changes, vaginal dryness, bone density loss, and weight gain. We strongly believe that the benefits far outweigh the risks. Patient understands these risks and consented to starting treatment. Planned treatment duration is 7 years.  If she does not tolerate it she will call us sooner than 3 months. RTC in 3 months for SCP visit    No orders of the defined types were placed in this encounter.  The patient has a good understanding of the overall plan. she agrees with it. she will call with any problems that may develop before the next visit here.  Total time spent: 20 mins including face to face time and time spent for planning, charting and coordination of care  Rulon Eisenmenger, MD, MPH 12/13/2020  I, Thana Ates, am acting as scribe for Dr. Nicholas Lose.  I have reviewed the above documentation for accuracy and completeness, and I agree with the above.

## 2020-12-12 NOTE — Assessment & Plan Note (Signed)
09/10/2020: Screening mammogram detected left breast mass later palpable, 1.2 cm at 5 o'clock position, axilla negative, biopsy: Grade 1-2 IDC, ER greater than 95%, PR 90%, Ki-67 5%, HER-2 negative ratio 1.36  09/16/2020: Breast MRI: 0.6 cm mass UOQ, non-mass enhancement LOQ spanning 2.1 cm, MRI guided biopsy is recommended for the indeterminate mass in the non-mass enhancement.  10/15/20: Left Lumpectomy: 1.9 cm Grade 2 IDC , Margins Neg, ER greater than 95%, PR 90%, Ki-67 5%, HER-2 negative ratio 1.36  Oncotype Dx: Score 16, ROR 4% Adj XRT 11/18/20-  Anastrozole counseling: We discussed the risks and benefits of anti-estrogen therapy with aromatase inhibitors. These include but not limited to insomnia, hot flashes, mood changes, vaginal dryness, bone density loss, and weight gain. We strongly believe that the benefits far outweigh the risks. Patient understands these risks and consented to starting treatment. Planned treatment duration is 7 years.  RTC in 3 months for SCP visit

## 2020-12-13 ENCOUNTER — Inpatient Hospital Stay: Payer: BC Managed Care – PPO | Attending: Genetic Counselor | Admitting: Hematology and Oncology

## 2020-12-13 ENCOUNTER — Other Ambulatory Visit: Payer: Self-pay

## 2020-12-13 ENCOUNTER — Ambulatory Visit
Admission: RE | Admit: 2020-12-13 | Discharge: 2020-12-13 | Disposition: A | Discharge status: Home / Self Care | Payer: BC Managed Care – PPO | Source: Ambulatory Visit | Attending: Radiation Oncology | Admitting: Radiation Oncology

## 2020-12-13 ENCOUNTER — Ambulatory Visit: Payer: BC Managed Care – PPO | Admitting: Hematology and Oncology

## 2020-12-13 DIAGNOSIS — C50512 Malignant neoplasm of lower-outer quadrant of left female breast: Secondary | ICD-10-CM | POA: Diagnosis present

## 2020-12-13 DIAGNOSIS — Z170 Estrogen receptor positive status [ER+]: Secondary | ICD-10-CM

## 2020-12-13 DIAGNOSIS — Z79899 Other long term (current) drug therapy: Secondary | ICD-10-CM | POA: Insufficient documentation

## 2020-12-13 DIAGNOSIS — L598 Other specified disorders of the skin and subcutaneous tissue related to radiation: Secondary | ICD-10-CM | POA: Diagnosis not present

## 2020-12-13 DIAGNOSIS — N6321 Unspecified lump in the left breast, upper outer quadrant: Secondary | ICD-10-CM | POA: Insufficient documentation

## 2020-12-13 DIAGNOSIS — Z880 Allergy status to penicillin: Secondary | ICD-10-CM | POA: Insufficient documentation

## 2020-12-13 DIAGNOSIS — Z881 Allergy status to other antibiotic agents status: Secondary | ICD-10-CM | POA: Diagnosis not present

## 2020-12-13 DIAGNOSIS — Z882 Allergy status to sulfonamides status: Secondary | ICD-10-CM | POA: Insufficient documentation

## 2020-12-13 MED ORDER — ANASTROZOLE 1 MG PO TABS: 1.0000 mg | ORAL_TABLET | Freq: Every day | ORAL | 3 refills | Status: DC

## 2020-12-14 ENCOUNTER — Encounter: Payer: Self-pay | Admitting: *Deleted

## 2020-12-14 ENCOUNTER — Ambulatory Visit
Admission: RE | Admit: 2020-12-14 | Discharge: 2020-12-14 | Disposition: A | Discharge status: Home / Self Care | Payer: BC Managed Care – PPO | Source: Ambulatory Visit | Attending: Radiation Oncology | Admitting: Radiation Oncology

## 2020-12-14 DIAGNOSIS — C50512 Malignant neoplasm of lower-outer quadrant of left female breast: Secondary | ICD-10-CM | POA: Diagnosis not present

## 2020-12-15 ENCOUNTER — Other Ambulatory Visit: Payer: Self-pay

## 2020-12-15 ENCOUNTER — Encounter: Payer: Self-pay | Admitting: Radiation Oncology

## 2020-12-15 ENCOUNTER — Ambulatory Visit
Admission: RE | Admit: 2020-12-15 | Discharge: 2020-12-15 | Disposition: A | Discharge status: Home / Self Care | Payer: BC Managed Care – PPO | Source: Ambulatory Visit | Attending: Radiation Oncology | Admitting: Radiation Oncology

## 2020-12-15 DIAGNOSIS — C50512 Malignant neoplasm of lower-outer quadrant of left female breast: Secondary | ICD-10-CM | POA: Diagnosis not present

## 2020-12-28 ENCOUNTER — Other Ambulatory Visit: Payer: Self-pay

## 2020-12-28 DIAGNOSIS — C50512 Malignant neoplasm of lower-outer quadrant of left female breast: Secondary | ICD-10-CM

## 2020-12-28 NOTE — Progress Notes (Signed)
Patient requesting bone density baseline scan prior to starting AI therapy.  MD verbal orders given for bone density, orders placed.    Patient notified - no further needs at this time.

## 2021-01-10 ENCOUNTER — Telehealth: Payer: Self-pay

## 2021-01-10 NOTE — Telephone Encounter (Signed)
Pt called stating she attempted to call to schedule bone density scan, but no one was answering. This LPN called pt back to inform her that Epic was down system-wide so no one was able to schedule appts. LVM and Advised pt to try again.

## 2021-01-13 ENCOUNTER — Other Ambulatory Visit: Payer: Self-pay

## 2021-01-13 DIAGNOSIS — C50512 Malignant neoplasm of lower-outer quadrant of left female breast: Secondary | ICD-10-CM

## 2021-01-14 ENCOUNTER — Other Ambulatory Visit: Payer: Self-pay

## 2021-01-14 ENCOUNTER — Telehealth: Payer: Self-pay | Admitting: *Deleted

## 2021-01-14 ENCOUNTER — Ambulatory Visit (HOSPITAL_COMMUNITY)
Admission: RE | Admit: 2021-01-14 | Discharge: 2021-01-14 | Disposition: A | Discharge status: Home / Self Care | Payer: BC Managed Care – PPO | Source: Ambulatory Visit | Attending: Radiation Oncology | Admitting: Radiation Oncology

## 2021-01-14 ENCOUNTER — Encounter: Payer: Self-pay | Admitting: Radiation Oncology

## 2021-01-14 ENCOUNTER — Ambulatory Visit
Admission: RE | Admit: 2021-01-14 | Discharge: 2021-01-14 | Disposition: A | Discharge status: Home / Self Care | Payer: BC Managed Care – PPO | Source: Ambulatory Visit | Attending: Radiation Oncology | Admitting: Radiation Oncology

## 2021-01-14 ENCOUNTER — Encounter: Payer: Self-pay | Admitting: General Practice

## 2021-01-14 ENCOUNTER — Ambulatory Visit
Admission: RE | Admit: 2021-01-14 | Discharge: 2021-01-14 | Disposition: A | Discharge status: Home / Self Care | Payer: BC Managed Care – PPO | Source: Ambulatory Visit | Attending: Hematology and Oncology | Admitting: Hematology and Oncology

## 2021-01-14 VITALS — BP 109/73 | HR 94 | Temp 97.5°F | Resp 18 | Ht 63.0 in | Wt 152.0 lb

## 2021-01-14 DIAGNOSIS — M5432 Sciatica, left side: Secondary | ICD-10-CM | POA: Diagnosis not present

## 2021-01-14 DIAGNOSIS — C50512 Malignant neoplasm of lower-outer quadrant of left female breast: Secondary | ICD-10-CM | POA: Insufficient documentation

## 2021-01-14 DIAGNOSIS — Z170 Estrogen receptor positive status [ER+]: Secondary | ICD-10-CM

## 2021-01-14 DIAGNOSIS — M25522 Pain in left elbow: Secondary | ICD-10-CM

## 2021-01-14 DIAGNOSIS — Z79899 Other long term (current) drug therapy: Secondary | ICD-10-CM | POA: Insufficient documentation

## 2021-01-14 IMAGING — DX DG RIBS 2V*L*
3 series · 3 of 3 positions shown · non-contrast
Comparison: Prior chest x-ray [DATE]

CLINICAL DATA: left lateral rib pain

EXAM:
LEFT RIBS - 2 VIEW

[rib pa]
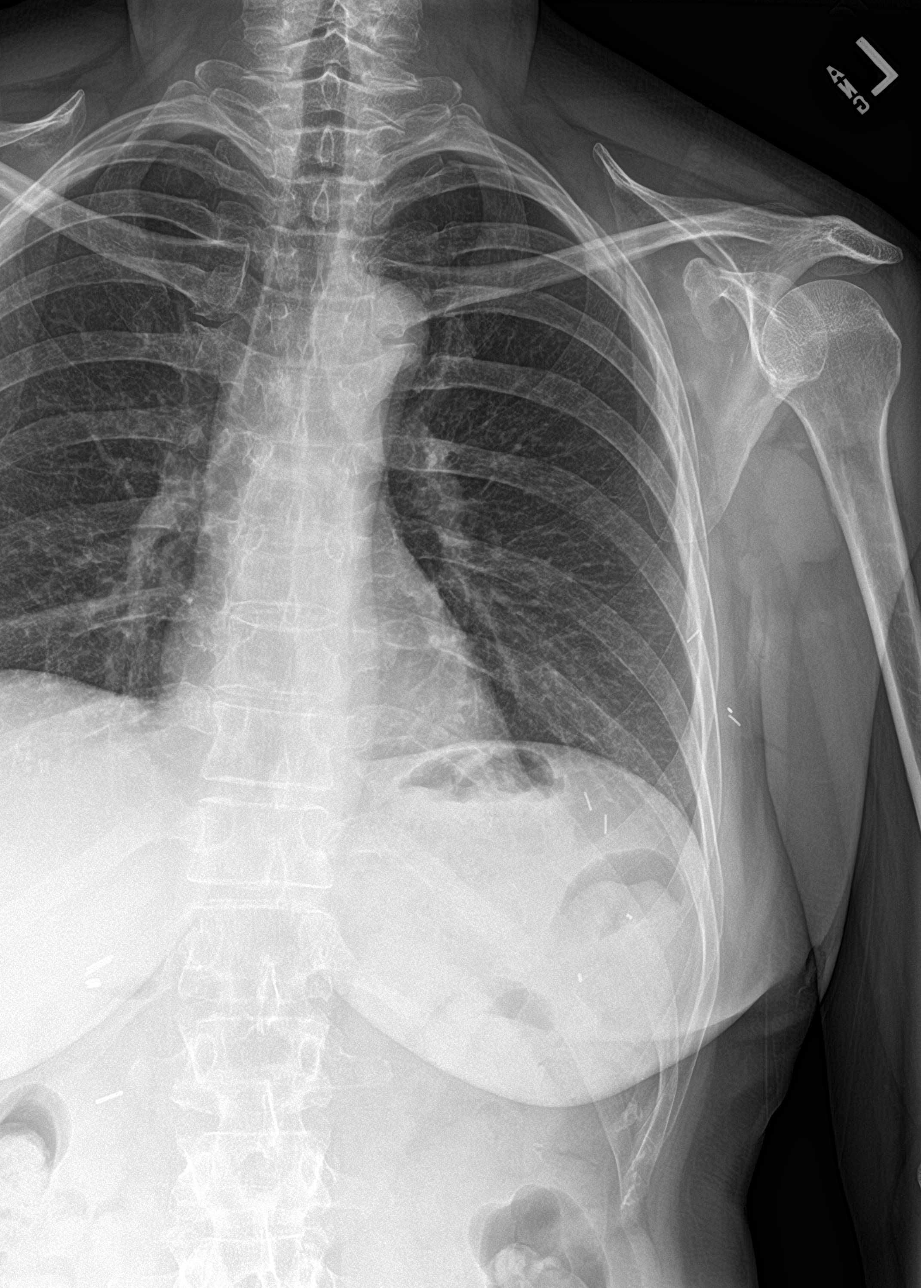

[rib pa obl]
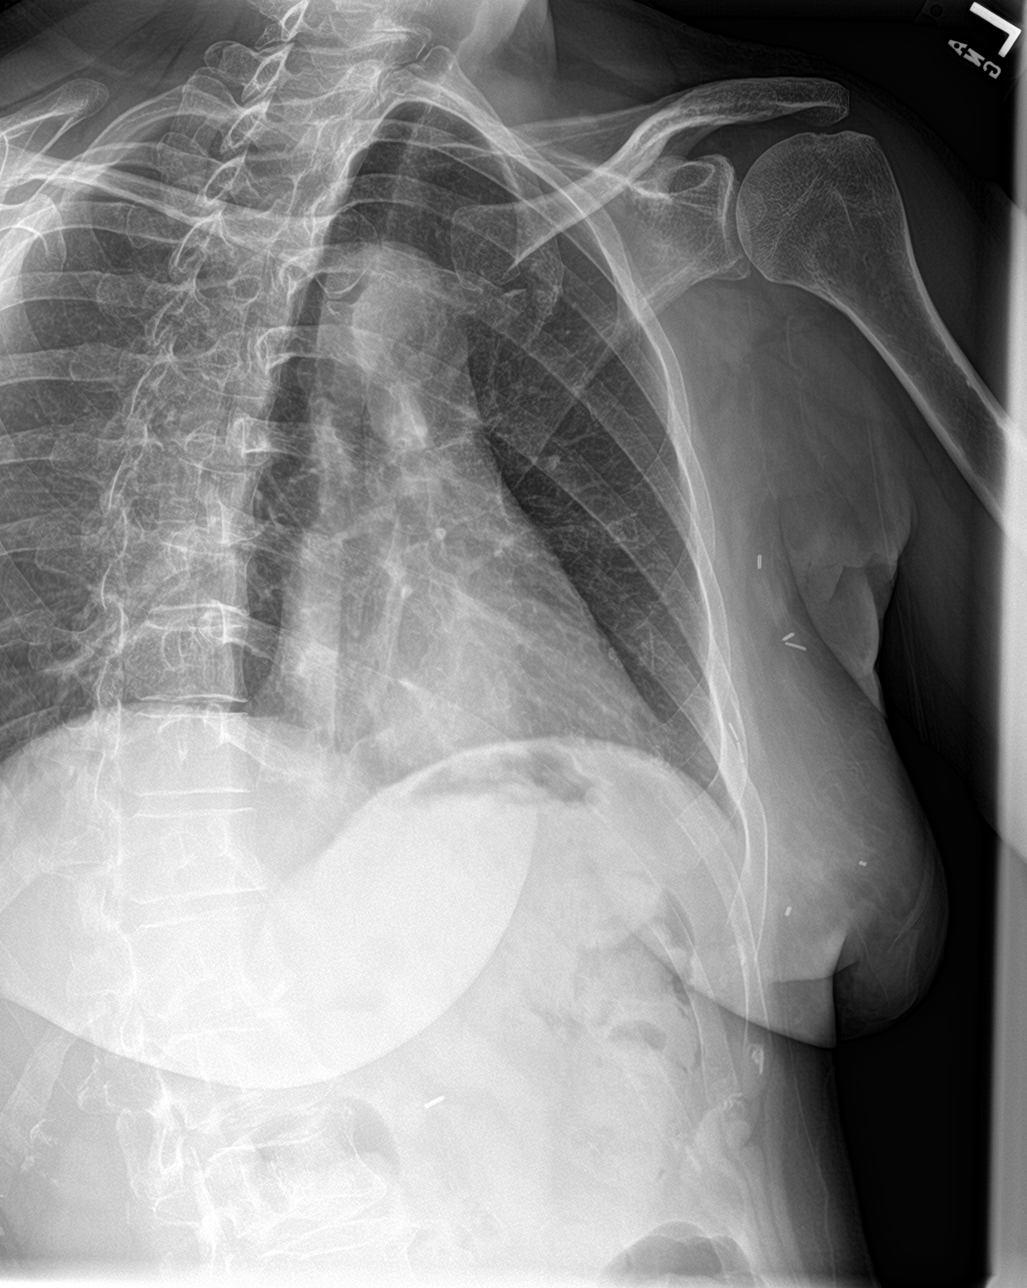

[chest pa]
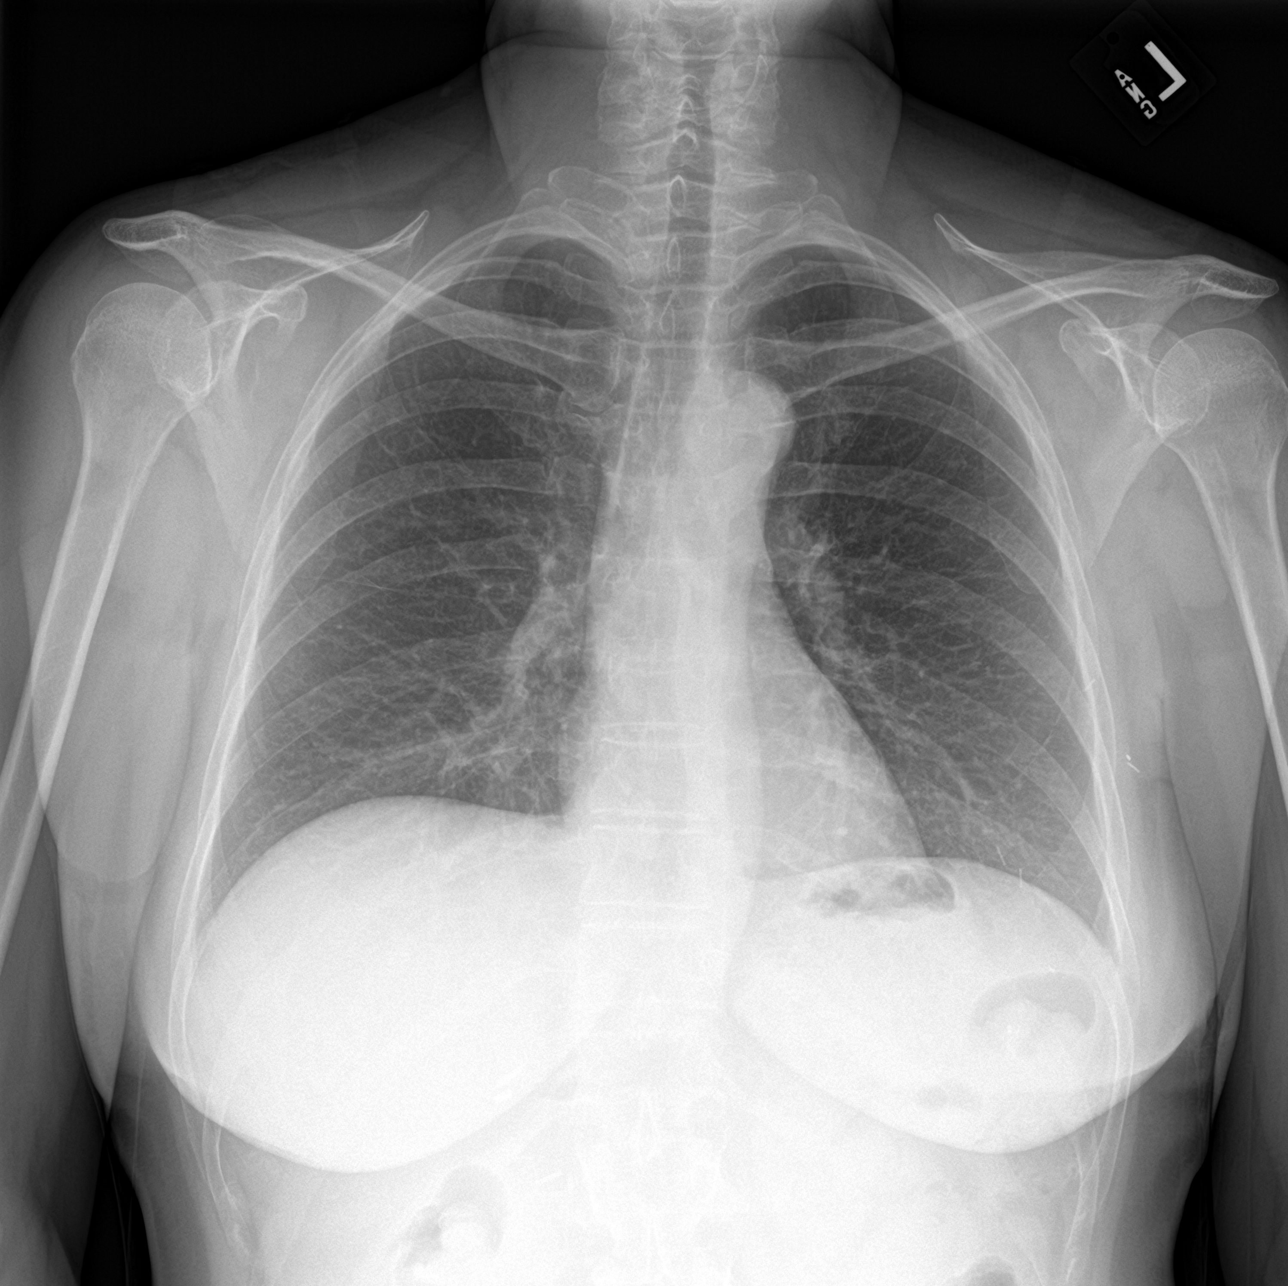

[3 of 3 positions shown; findings below may reference images not displayed]

FINDINGS: Surgical clips in the left breast and left axilla consistent with
prior lumpectomy and sentinel node resection. Surgical clips in the
right upper quadrant suggest prior cholecystectomy.

The lungs are clear. No evidence of fracture, malalignment or
osseous lesion. Cardiac and mediastinal contours are within normal
limits.
IMPRESSION: Negative.

## 2021-01-14 NOTE — Telephone Encounter (Signed)
Called patient to inform of appt. wth [facility] on 01-17-21- arrival time- 10:15 am with Dr. Rolena Infante, adddress- [address] [place] , [place]., [address], phone number [phone], lvm for a return call

## 2021-01-14 NOTE — Progress Notes (Signed)
Radiation Oncology         [phone] ________________________________  Name: ZETTIE GOOTEE MRN: [id number]  Date: 01/14/2021  DOB: 1965-08-19  Follow-Up Visit Note  outpatient  CC: Vania Rea, MD  Nicholas Lose, MD  Diagnosis and Prior Radiotherapy:    ICD-10-CM   1. Malignant neoplasm of lower-outer quadrant of left breast of female, estrogen receptor positive ([place])  C50.512 DG Ribs Unilateral Left   Z17.0     2. Arthralgia of left upper arm  M25.522 AMB referral to orthopedics      CHIEF COMPLAINT: Here for follow-up and surveillance of breast cancer  Narrative:  The patient returns today for routine follow-up.  Mrs. Dault presents today for follow-up after completing radiation to her left breast on 12/15/2020  Pain: Reports tenderness and discomfort along her axilla and left ribs.  Skin: Reports skin has healed well, and denies any lingering issues/concerns. ROM: Reports left shoulder discomfort/limitation, but feels this may be related to a tear in she rotator cuff that she hasn't been able to address.  Lymphedema: Patient denies MedOnc F/U: Scheduled for Survivorship Care Plan with Annabelle Harman on 03/17/2021 Other issues of note: Experiencing some personal/emotional hardship with family stressors. Open to meeting with [place] for spiritual support. Scheduled for bone density scan this afternoon  Pt reports Yes No Comments  Tamoxifen _0  _1    Letrozole _2  _3    Anastrazole _4  _5  Will start after results of bone density scan  Mammogram _6  Date: TBD _7                                   ALLERGIES:  is allergic to penicillins, augmentin [amoxicillin-pot clavulanate], and sulfa antibiotics.  Meds: Current Outpatient Medications  Medication Sig Dispense Refill   ALPRAZolam (XANAX) 0.25 MG tablet Take 0.25 mg by mouth as needed.     anastrozole (ARIMIDEX) 1 MG tablet Take 1 tablet (1 mg total) by mouth daily. 90 tablet 3   Dapagliflozin-metFORMIN HCl ER  03-999 MG TB24 Xigduo XR 10 mg-1,000 mg tablet,extended release     escitalopram (LEXAPRO) 10 MG tablet Take 1 tablet (10 mg total) by mouth daily. Needs ov. Thanks. 90 tablet 0   glipiZIDE (GLUCOTROL XL) 5 MG 24 hr tablet Take 5 mg by mouth daily with breakfast.     MULTIPLE VITAMIN PO Take 0.5 tablets by mouth.      SitaGLIPtin-MetFORMIN HCl [phone] MG TB24 Janumet XR 100 mg-1,000 mg tablet,extended release     zolpidem (AMBIEN) 10 MG tablet zolpidem 10 mg tablet     No current facility-administered medications for this encounter.    Physical Findings: The patient is in no acute distress. Patient is alert and oriented.  height is _8  (1.6 m) and weight is 152 lb (68.9 kg). Her temperature is 97.5 F (36.4 C) (abnormal). Her blood pressure is 109/73 and her pulse is 94. Her respiration is 18 and oxygen saturation is 100%. .    Satisfactory skin healing in radiotherapy fields.  No significant swelling palpated in the left axilla nor any mass palpated in the left lateral breast and torso   Lab Findings: No results found for: WBC, HGB, HCT, MCV, PLT  Radiographic Findings: No results found.  Impression/Plan: Healing well from radiotherapy to the breast tissue.   She does have persistent soreness in the left lateral rib cage.  This started during radiation therapy.  I believe this is  likely inflammatory related to her past surgery and radiation.  She also has left shoulder pain which was present for at least a year and a half prior to her breast cancer diagnosis.  She believes this may be a rotator cuff injury but never got it worked up.  She also has left-sided sciatica.   I pulled up with her CT simulation images from her radiation planning and reviewed these images with her.  I do not see any sign of malignancy in her left lateral rib cage.  She states that her anxiety will be further alleviated if she can get some sort of diagnostic x-ray today and so we will proceed with an x-ray of  her left rib cage today.  I recommended she take Aleve twice a day for this pain for the next month or 2 and increase her dose of gabapentin at night to 300 mg for the next month or 2.  If her pain is not improved in 2 months when she sees survivorship she will let them now.  For her sciatica and shoulder pain I made a referral to orthopedics.  Today she met with spiritual care -Lattie Haw will provide additional emotional support as the patient is navigating quite a bit of stress personally.   I will see her back on an as-needed basis.  She knows not to hesitate to call if she has any questions.  She expressed gratitude for her care.  On date of service, in total, I spent 25 minutes on this encounter. Patient was seen in person.  _____________________________________   Eppie Gibson, MD

## 2021-01-14 NOTE — Progress Notes (Signed)
[facility] Spiritual Care Note  Referred by Dr Isidore Moos and Willodean Rosenthal for pastoral listening and spiritual/emotional support. 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  She benefited from having a dedicated listener and witness to her stories. Provided reflective listening, affirmation of strengths, pastoral encouragement, and introduction to [employer] support resources (including massage, reiki, healing arts classes, support groups, etc).  We plan to follow up by phone next week.   [place], [place], [facility] Pager [phone] Voicemail [phone]

## 2021-01-14 NOTE — Progress Notes (Addendum)
Amy Stephenson presents today for follow-up after completing radiation to her left breast on 12/15/2020  Pain: Reports tenderness and discomfort along her axilla and left ribs.  Skin: Reports skin has healed well, and denies any lingering issues/concerns. ROM: Reports left shoulder discomfort/limitation, but feels this may be related to a tear in she rotator cuff that she hasn't been able to address.  Lymphedema: Patient denies MedOnc F/U: Scheduled for Survivorship Care Plan with Annabelle Harman on 03/17/2021 Other issues of note: Experiencing some personal/emotional hardship with family stressors. Open to meeting with [place] for spiritual support. Scheduled for bone density scan this afternoon  Pt reports Yes No Comments  Tamoxifen '[]'$  '[x]'$    Letrozole '[]'$  '[x]'$    Anastrazole '[x]'$  '[]'$  Will start after results of bone density scan  Mammogram '[x]'$  Date: TBD '[]'$ 

## 2021-01-17 ENCOUNTER — Ambulatory Visit: Payer: BC Managed Care – PPO | Attending: General Surgery

## 2021-01-17 ENCOUNTER — Other Ambulatory Visit: Payer: Self-pay

## 2021-01-17 VITALS — Wt 152.2 lb

## 2021-01-17 DIAGNOSIS — Z483 Aftercare following surgery for neoplasm: Secondary | ICD-10-CM | POA: Insufficient documentation

## 2021-01-17 NOTE — Therapy (Signed)
[place], [place], [address] Phone: [phone]   Fax:  [phone]  Physical Therapy Treatment  Patient Details  Name: Amy Stephenson MRN: [id number] Date of Birth: 1965/07/30 Referring Provider (PT): Dr. Donne Hazel   Encounter Date: 01/17/2021   PT End of Session - 01/17/21 1519     Visit Number 2   # unchanged due to screen only   PT Start Time 75    PT Stop Time 1521    PT Time Calculation (min) 9 min    Activity Tolerance Patient tolerated treatment well    Behavior During Therapy [facility] for tasks assessed/performed             Past Medical History:  Diagnosis Date   Diabetes mellitus without complication ([place])    Family history of breast cancer    Family history of colon cancer    Family history of prostate cancer    Family history of stomach cancer     Past Surgical History:  Procedure Laterality Date   BREAST LUMPECTOMY WITH RADIOACTIVE SEED AND SENTINEL LYMPH NODE BIOPSY Left 10/15/2020   Procedure: LEFT BREAST LUMPECTOMY WITH RADIOACTIVE SEED AND LEFT AXILLARY SENTINEL LYMPH NODE BIOPSY;  Surgeon: Rolm Bookbinder, MD;  Location: [place];  Service: General;  Laterality: Left;  90 MINUTES ROOM 8   CHOLECYSTECTOMY     COLONOSCOPY      Vitals:   01/17/21 1518  Weight: 152 lb 4 oz (69.1 kg)     Subjective Assessment - 01/17/21 1518     Subjective Pt returns for her 3 month L-Dex screen.    Pertinent History left breast cancer found on mammogram 08/2020   She does not know if she will need to have radiation or chemo .  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 Her change from baseline of -1.9 is WNLs so no further treatment is required except to cont every 3 month L-Dex screens which pt is agreeable to.    PT Next Visit Plan Cont every 3 month L-Dex screens for up to 2 years from her SLNB.    Consulted and Agree with Plan of Care Patient             Patient will benefit from skilled therapeutic intervention in order to improve the following deficits and impairments:     Visit Diagnosis: Aftercare following surgery for neoplasm     Problem List Patient Active Problem List   Diagnosis  Date Noted   Genetic testing 10/04/2020   Family history of breast cancer    Family history of colon cancer    Family history of prostate cancer    Family history of stomach cancer    Malignant neoplasm of lower-outer quadrant of left breast of female, estrogen receptor positive ([place]) 09/22/2020   Anxiety 09/07/2015    Otelia Limes, PTA 01/17/2021, 3:25 PM  [place], [place], [address] Phone: [phone]   Fax:  [phone]  Name: Amy Stephenson MRN: [id number] Date of Birth: [birth year]

## 2021-01-18 ENCOUNTER — Encounter: Payer: Self-pay | Admitting: Hematology and Oncology

## 2021-01-18 ENCOUNTER — Encounter: Payer: Self-pay | Admitting: General Practice

## 2021-01-18 NOTE — Progress Notes (Signed)
[facility] Spiritual Care Note  Phoned Amy Stephenson as planned, leaving voicemail of support with encouragement to return call.   [place], [place], [facility] Pager [phone] Voicemail [phone]

## 2021-01-21 ENCOUNTER — Telehealth: Payer: Self-pay

## 2021-01-21 NOTE — Telephone Encounter (Signed)
RN placed call to patient.  Contact information left for call back.  Re: Bone Density

## 2021-01-28 ENCOUNTER — Encounter: Payer: Self-pay | Admitting: General Practice

## 2021-01-28 NOTE — Progress Notes (Signed)
[place] Spiritual Care Note  Received and returned voicemail from [place]. Plan to follow up by phone again next week.   [place], [place], [facility] Pager [phone] Voicemail [phone]

## 2021-01-29 NOTE — Progress Notes (Signed)
HEMATOLOGY-ONCOLOGY TELEPHONE VISIT PROGRESS NOTE  I connected with Amy Stephenson on 01/31/2021 at  1:30 PM EDT by telephone and verified that I am speaking with the correct person using two identifiers.  I discussed the limitations, risks, security and privacy concerns of performing an evaluation and management service by telephone and the availability of in person appointments.  I also discussed with the patient that there may be a patient responsible charge related to this service. The patient expressed understanding and agreed to proceed.   History of Present Illness: Amy Stephenson is a 55 y.o. female with above-mentioned history of invasive mammary carcinoma of the left breast having completed radiation therapy. She reports via telephone today for follow-up.  Her major symptoms are related to musculoskeletal aches and pains as well as neuropathic pain from sciatica.  She has symptoms of rib pain for which x-rays were done which did not show any evidence of lytic lesions or fractures.  She has made a decision not to start antiestrogen therapy at this time.  Oncology History  Malignant neoplasm of lower-outer quadrant of left breast of female, estrogen receptor positive ([place])  09/10/2020 Initial Diagnosis   Screening mammogram showed a possible left breast mass, palpable on exam. Diagnostic mammogram and US showed a 1.2cm mass at the 5 o'clock position and no abnormal axillary lymph nodes. Biopsy showed invasive mammary carcinoma, grade 1-2, HER-2 equivocal by IHC (2+), negative by FISH (ratio 1.36), ER+>95%, PR+ 90%, Ki67 5%. B   09/10/2020 Cancer Staging   Staging form: Breast, AJCC 8th Edition - Clinical stage from 09/10/2020: Stage IB (cT2, cN0, cM0, G2, ER+, PR+, HER2-) - Signed by Gardenia Phlegm, NP on 09/22/2020  Stage prefix: Initial diagnosis  Histologic grading system: 3 grade system    09/16/2020 Breast MRI   0.6cm mass in the upper outer quadrant, non-mass enhancement in  the low outer quadrant, and the known malignancy spanning 2.1cm in the left breast with no evidence of right breast malignancy.    Genetic Testing   Negative genetic testing. No pathogenic variants identified on the [facility] STAT+Multi-Cancer Panel. The report date is 10/04/2020.  The STAT Breast cancer panel offered by [facility] includes sequencing and rearrangement analysis for the following 9 genes:  ATM, BRCA1, BRCA2, CDH1, CHEK2, PALB2, PTEN, STK11 and TP53.    The Multi-Cancer Panel + RNA offered by [facility] includes sequencing and/or deletion duplication testing of the following 84 genes: AIP, ALK, APC, ATM, AXIN2,BAP1,  BARD1, BLM, BMPR1A, BRCA1, BRCA2, BRIP1, CASR, CDC73, CDH1, CDK4, CDKN1B, CDKN1C, CDKN2A (p14ARF), CDKN2A (p16INK4a), CEBPA, CHEK2, CTNNA1, DICER1, DIS3L2, EGFR (c.2369C>T, p.Thr790Met variant only), EPCAM (Deletion/duplication testing only), FH, FLCN, GATA2, GPC3, GREM1 (Promoter region deletion/duplication testing only), HOXB13 (c.251G>A, p.Gly84Glu), HRAS, KIT, MAX, MEN1, MET, MITF (c.952G>A, p.Glu318Lys variant only), MLH1, MSH2, MSH3, MSH6, MUTYH, NBN, NF1, NF2, NTHL1, PALB2, PDGFRA, PHOX2B, PMS2, POLD1, POLE, POT1, PRKAR1A, PTCH1, PTEN, RAD50, RAD51C, RAD51D, RB1, RECQL4, RET, RUNX1, SDHAF2, SDHA (sequence changes only), SDHB, SDHC, SDHD, SMAD4, SMARCA4, SMARCB1, SMARCE1, STK11, SUFU, TERC, TERT, TMEM127, TP53, TSC1, TSC2, VHL, WRN and WT1.   11/04/2020 Cancer Staging   Staging form: Breast, AJCC 8th Edition - Pathologic: Stage IA (pT1c, pN0, cM0, G2, ER+, PR+, HER2-) - Signed by Nicholas Lose, MD on 11/04/2020  Stage prefix: Initial diagnosis  Histologic grading system: 3 grade system    11/17/2020 - 12/15/2020 Radiation Therapy   Adjuvant radiation therapy     Observations/Objective:     Assessment Plan:  Malignant neoplasm of  lower-outer quadrant of left breast of female, estrogen receptor positive ([name]) 09/10/2020: Screening mammogram detected left breast mass  later palpable, 1.2 cm at 5 o'clock position, axilla negative, biopsy: Grade 1-2 IDC, ER greater than 95%, PR 90%, Ki-67 5%, HER-2 negative ratio 1.36   09/16/2020: Breast MRI: 0.6 cm mass UOQ, non-mass enhancement LOQ spanning 2.1 cm, MRI guided biopsy is recommended for the indeterminate mass in the non-mass enhancement. 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  She may decide not to take it at all.  She will think about it at a later time and make a decision finally.  Surveillance: Patient would like to have a mammogram of the left breast in October we will set that up.  We discussed the role of breast MRIs.  We may consider doing a breast MRI every 2 to 3 years as needed.  RTC for SCP in November     I discussed the assessment and treatment plan with the patient. The patient was provided an opportunity to ask questions and all were answered. The patient agreed with the plan and demonstrated an understanding of the instructions. The patient was advised to call back or seek an in-person evaluation if the symptoms worsen or if the condition fails to improve as anticipated.   Total time spent: 20 mins including non-face to face time and time spent for planning, charting and coordination of care  Rulon Eisenmenger, MD 01/31/2021    I, Thana Ates, am acting as scribe for Nicholas Lose, MD.  I have reviewed the above documentation for accuracy and completeness, and I agree with the above.

## 2021-01-30 NOTE — Assessment & Plan Note (Signed)
09/10/2020: Screening mammogram detected left breast mass later palpable, 1.2 cm at 5 o'clock position, axilla negative, biopsy: Grade 1-2 IDC, ER greater than 95%, PR 90%, Ki-67 5%, HER-2 negative ratio 1.36  09/16/2020: Breast MRI: 0.6 cm mass UOQ, non-mass enhancement LOQ spanning 2.1 cm, MRI guided biopsy is recommended for the indeterminate mass in the non-mass enhancement.  10/15/20: Left Lumpectomy: 1.9 cm Grade 2 IDC , Margins Neg, ER greater than 95%, PR 90%, Ki-67 5%, HER-2 negative ratio 1.36  Oncotype Dx: Score 16, ROR 4% Adj XRT 11/18/20- 12/15/20  Anastrozole Toxicities:  Breast Cancer Surveillance: 1. Breast Exam: 01/31/21: Benign 2. Mammograms:   RTC in 1 year for follow up

## 2021-01-31 ENCOUNTER — Other Ambulatory Visit: Payer: Self-pay

## 2021-01-31 ENCOUNTER — Inpatient Hospital Stay: Payer: BC Managed Care – PPO | Attending: Genetic Counselor | Admitting: Hematology and Oncology

## 2021-01-31 DIAGNOSIS — Z170 Estrogen receptor positive status [ER+]: Secondary | ICD-10-CM

## 2021-01-31 DIAGNOSIS — C50512 Malignant neoplasm of lower-outer quadrant of left female breast: Secondary | ICD-10-CM | POA: Diagnosis not present

## 2021-01-31 MED ORDER — GABAPENTIN 300 MG PO CAPS: 300.0000 mg | ORAL_CAPSULE | Freq: Every day | ORAL | 3 refills | Status: DC

## 2021-02-01 ENCOUNTER — Other Ambulatory Visit: Payer: Self-pay | Admitting: Adult Health

## 2021-02-01 ENCOUNTER — Encounter: Payer: Self-pay | Admitting: General Practice

## 2021-02-01 DIAGNOSIS — Z170 Estrogen receptor positive status [ER+]: Secondary | ICD-10-CM

## 2021-02-01 DIAGNOSIS — C50512 Malignant neoplasm of lower-outer quadrant of left female breast: Secondary | ICD-10-CM

## 2021-02-01 NOTE — Progress Notes (Signed)
[facility] Spiritual Care Note  Received and returned voicemail again, encouraging return call.   [place], [place], [facility] Pager [phone] Voicemail [phone]

## 2021-02-01 NOTE — Progress Notes (Signed)
894 

## 2021-02-02 ENCOUNTER — Telehealth: Payer: Self-pay | Admitting: Hematology and Oncology

## 2021-02-02 NOTE — Telephone Encounter (Signed)
Scheduled per 8/8 los. Called and spoke with pt confirmed 11/18 appt

## 2021-02-07 ENCOUNTER — Encounter: Payer: Self-pay | Admitting: General Practice

## 2021-02-07 NOTE — Progress Notes (Signed)
[facility] Spiritual Care Note  Finally connected  with Ms Alvares by phone. She was very appreciative of pastoral presence, reflective listening, and witness to her story, which weaves together themes of caregiving, family conflict, and health needs. She welcomes ongoing prayer for her concerns and knows to reach out as needed/desired.    [place], [place], [facility] Pager [phone] Voicemail [phone]

## 2021-02-07 NOTE — Progress Notes (Signed)
                                                                                                                                                             Patient Name: Amy Stephenson MRN: [id number] DOB: [birth year] Referring Physician: Nicholas Lose (Profile Not Attached) Date of Service: 12/15/2020 [facility] [facility], [place]                                                        End Of Treatment Note  Diagnoses: C50.512-Malignant neoplasm of lower-outer quadrant of left female breast  Cancer Staging: Cancer Staging Malignant neoplasm of lower-outer quadrant of left breast of female, estrogen receptor positive ([place]) Staging form: Breast, AJCC 8th Edition - Clinical stage from 09/10/2020: Stage IB (cT2, cN0, cM0, G2, ER+, PR+, HER2-) - Signed by Gardenia Phlegm, NP on 09/22/2020 Stage prefix: Initial diagnosis Histologic grading system: 3 grade system - Pathologic: Stage IA (pT1c, pN0, cM0, G2, ER+, PR+, HER2-) - Signed by Nicholas Lose, MD on 11/04/2020 Stage prefix: Initial diagnosis Histologic grading system: 3 grade system  Intent: Curative  Radiation Treatment Dates: 11/17/2020 through 12/15/2020 Site Technique Total Dose (Gy) Dose per Fx (Gy) Completed Fx Beam Energies  Breast, Left: Breast_Lt 3D 40.05/40.05 2.67 15/15 6XFFF  Breast, Left: Breast_Lt_Bst 3D 10/10 2 5/5 6X   Narrative: The patient tolerated radiation therapy relatively well.   Plan: The patient will follow-up with radiation oncology in 1 mo or prn.  -----------------------------------  Eppie Gibson, MD

## 2021-03-06 DIAGNOSIS — M5030 Other cervical disc degeneration, unspecified cervical region: Secondary | ICD-10-CM | POA: Insufficient documentation

## 2021-03-07 ENCOUNTER — Other Ambulatory Visit: Payer: Self-pay | Admitting: *Deleted

## 2021-03-07 ENCOUNTER — Telehealth: Payer: Self-pay | Admitting: *Deleted

## 2021-03-07 DIAGNOSIS — C50512 Malignant neoplasm of lower-outer quadrant of left female breast: Secondary | ICD-10-CM

## 2021-03-07 NOTE — Progress Notes (Signed)
Received call from pt with concern regarding breast MRI results from 09/16/20.  MRI shows several mass areas in the left breast with recommendations of obtaining images in 6 months.  Pt requesting orders be placed for MRI of the breast to evaluate area's of concern in left breast.  Per MD okay to proceed with order. Pt also requesting visit with MD to discuss [place] lab work.  Appt scheduled and pt verbalized understanding of date and time.

## 2021-03-07 NOTE — Telephone Encounter (Signed)
Received VM from pt.  Attempt x1 to return call.  No answer, LVM to return call to the office.  

## 2021-03-08 ENCOUNTER — Encounter: Payer: Self-pay | Admitting: Hematology and Oncology

## 2021-03-15 ENCOUNTER — Encounter: Payer: Self-pay | Admitting: *Deleted

## 2021-03-16 NOTE — Progress Notes (Signed)
HEMATOLOGY-ONCOLOGY TELEPHONE VISIT PROGRESS NOTE  I connected with Amy Stephenson on 03/17/2021 at 10:15 AM EDT by telephone and verified that I am speaking with the correct person using two identifiers.  I discussed the limitations, risks, security and privacy concerns of performing an evaluation and management service by telephone and the availability of in person appointments.  I also discussed with the patient that there may be a patient responsible charge related to this service. The patient expressed understanding and agreed to proceed.   History of Present Illness: Amy Stephenson is a 55 y.o. female with above-mentioned history of invasive mammary carcinoma of the left breast having completed radiation therapy. She reports via telephone today for follow-up.    Oncology History  Malignant neoplasm of lower-outer quadrant of left breast of female, estrogen receptor positive ([place])  09/10/2020 Initial Diagnosis   Screening mammogram showed a possible left breast mass, palpable on exam. Diagnostic mammogram and US showed a 1.2cm mass at the 5 o'clock position and no abnormal axillary lymph nodes. Biopsy showed invasive mammary carcinoma, grade 1-2, HER-2 equivocal by IHC (2+), negative by FISH (ratio 1.36), ER+>95%, PR+ 90%, Ki67 5%. B   09/10/2020 Cancer Staging   Staging form: Breast, AJCC 8th Edition - Clinical stage from 09/10/2020: Stage IB (cT2, cN0, cM0, G2, ER+, PR+, HER2-) - Signed by Gardenia Phlegm, NP on 09/22/2020 Stage prefix: Initial diagnosis Histologic grading system: 3 grade system   09/16/2020 Breast MRI   0.6cm mass in the upper outer quadrant, non-mass enhancement in the low outer quadrant, and the known malignancy spanning 2.1cm in the left breast with no evidence of right breast malignancy.    Genetic Testing   Negative genetic testing. No pathogenic variants identified on the [facility] STAT+Multi-Cancer Panel. The report date is 10/04/2020.  The STAT Breast  cancer panel offered by [facility] includes sequencing and rearrangement analysis for the following 9 genes:  ATM, BRCA1, BRCA2, CDH1, CHEK2, PALB2, PTEN, STK11 and TP53.    The Multi-Cancer Panel + RNA offered by [facility] includes sequencing and/or deletion duplication testing of the following 84 genes: AIP, ALK, APC, ATM, AXIN2,BAP1,  BARD1, BLM, BMPR1A, BRCA1, BRCA2, BRIP1, CASR, CDC73, CDH1, CDK4, CDKN1B, CDKN1C, CDKN2A (p14ARF), CDKN2A (p16INK4a), CEBPA, CHEK2, CTNNA1, DICER1, DIS3L2, EGFR (c.2369C>T, p.Thr790Met variant only), EPCAM (Deletion/duplication testing only), FH, FLCN, GATA2, GPC3, GREM1 (Promoter region deletion/duplication testing only), HOXB13 (c.251G>A, p.Gly84Glu), HRAS, KIT, MAX, MEN1, MET, MITF (c.952G>A, p.Glu318Lys variant only), MLH1, MSH2, MSH3, MSH6, MUTYH, NBN, NF1, NF2, NTHL1, PALB2, PDGFRA, PHOX2B, PMS2, POLD1, POLE, POT1, PRKAR1A, PTCH1, PTEN, RAD50, RAD51C, RAD51D, RB1, RECQL4, RET, RUNX1, SDHAF2, SDHA (sequence changes only), SDHB, SDHC, SDHD, SMAD4, SMARCA4, SMARCB1, SMARCE1, STK11, SUFU, TERC, TERT, TMEM127, TP53, TSC1, TSC2, VHL, WRN and WT1.   11/04/2020 Cancer Staging   Staging form: Breast, AJCC 8th Edition - Pathologic: Stage IA (pT1c, pN0, cM0, G2, ER+, PR+, HER2-) - Signed by Nicholas Lose, MD on 11/04/2020 Stage prefix: Initial diagnosis Histologic grading system: 3 grade system   11/17/2020 - 12/15/2020 Radiation Therapy   Adjuvant radiation therapy     Observations/Objective:     Assessment Plan:  Malignant neoplasm of lower-outer quadrant of left breast of female, estrogen receptor positive ([place]) 09/10/2020: Screening mammogram detected left breast mass later palpable, 1.2 cm at 5 o'clock position, axilla negative, biopsy: Grade 1-2 IDC, ER greater than 95%, PR 90%, Ki-67 5%, HER-2 negative ratio 1.36   09/16/2020: Breast MRI: 0.6 cm mass UOQ, non-mass enhancement LOQ spanning 2.1 cm, MRI  guided biopsy is recommended for the indeterminate mass in the  non-mass enhancement. 10/15/20: Left Lumpectomy: 1.9 cm Grade 2 IDC , Margins Neg, ER greater than 95%, PR 90%, Ki-67 5%, HER-2 negative ratio 1.36   Oncotype Dx: Score 16, ROR 4% Adj XRT 11/18/20- 12/15/20   Bone density: -2.7: Osteoporosis   Surveillance:  1.  Breast exam 03/18/2019: Benign 2. mammogram and breast MRI appointments have been scheduled for November  breast MRI every 2 to 3 years as needed. 3.  At patient's request we are performing [place] lab test for minimal residual disease. This test will be repeated every 6 months.  Patient will have this done through a home phlebotomy process.  I discussed with the patient extensively about the circulating tumor DNA testing.  We are learning more about these tests.  It appears that the [place] test has a 89% sensitivity and 100% specificity.  It was able to detect breast cancer 2 years before it was detected through routine evaluation.  I am concerned about what to do if we get a positive test result.  Patient understands ambiguity of that situation.  She is willing to proceed with that full understanding that she will need to undergo additional scans and procedures to figure out if there is truly a breast cancer recurrence.    I discussed the assessment and treatment plan with the patient. The patient was provided an opportunity to ask questions and all were answered. The patient agreed with the plan and demonstrated an understanding of the instructions. The patient was advised to call back or seek an in-person evaluation if the symptoms worsen or if the condition fails to improve as anticipated.   Total time spent: 11 mins including non-face to face time and time spent for planning, charting and coordination of care  Rulon Eisenmenger, MD 03/17/2021    I, Thana Ates, am acting as scribe for Nicholas Lose, MD.  I have reviewed the above documentation for accuracy and completeness, and I agree with the above.

## 2021-03-17 ENCOUNTER — Inpatient Hospital Stay: Payer: BC Managed Care – PPO | Attending: Genetic Counselor | Admitting: Hematology and Oncology

## 2021-03-17 ENCOUNTER — Encounter: Payer: BC Managed Care – PPO | Admitting: Adult Health

## 2021-03-17 DIAGNOSIS — Z170 Estrogen receptor positive status [ER+]: Secondary | ICD-10-CM

## 2021-03-17 DIAGNOSIS — C50512 Malignant neoplasm of lower-outer quadrant of left female breast: Secondary | ICD-10-CM

## 2021-03-17 NOTE — Assessment & Plan Note (Signed)
09/10/2020: Screening mammogram detected left breast mass later palpable, 1.2 cm at 5 o'clock position, axilla negative, biopsy: Grade 1-2 IDC, ER greater than 95%, PR 90%, Ki-67 5%, HER-2 negative ratio 1.36  09/16/2020: Breast MRI: 0.6 cm mass UOQ, non-mass enhancement LOQ spanning 2.1 cm, MRI guided biopsy is recommended for the indeterminate mass in the non-mass enhancement. 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  She may decide not to take it at all.  She will think about it at a later time and make a decision finally.  Surveillance:  1.  Breast exam 03/18/2019: Benign 2. mammogram and breast MRI appointments have been scheduled for November  breast MRI every 2 to 3 years as needed. 3.  At patient's request we are performing [place] lab test for minimal residual disease. This test will be repeated every 6 months  Return to clinic in 1 year for follow-up  RTC for SCP in November

## 2021-03-23 ENCOUNTER — Other Ambulatory Visit: Payer: Self-pay

## 2021-03-23 DIAGNOSIS — C50512 Malignant neoplasm of lower-outer quadrant of left female breast: Secondary | ICD-10-CM

## 2021-03-23 DIAGNOSIS — Z170 Estrogen receptor positive status [ER+]: Secondary | ICD-10-CM

## 2021-03-31 ENCOUNTER — Inpatient Hospital Stay: Payer: BC Managed Care – PPO | Attending: Genetic Counselor | Admitting: Adult Health

## 2021-03-31 ENCOUNTER — Other Ambulatory Visit: Payer: Self-pay

## 2021-03-31 ENCOUNTER — Other Ambulatory Visit: Payer: Self-pay | Admitting: Oncology

## 2021-03-31 ENCOUNTER — Encounter: Payer: Self-pay | Admitting: Adult Health

## 2021-03-31 VITALS — BP 112/74 | HR 85 | Temp 97.8°F | Resp 18 | Ht 63.0 in | Wt 153.1 lb

## 2021-03-31 DIAGNOSIS — R0789 Other chest pain: Secondary | ICD-10-CM | POA: Insufficient documentation

## 2021-03-31 DIAGNOSIS — N644 Mastodynia: Secondary | ICD-10-CM | POA: Diagnosis not present

## 2021-03-31 DIAGNOSIS — Z803 Family history of malignant neoplasm of breast: Secondary | ICD-10-CM | POA: Diagnosis not present

## 2021-03-31 DIAGNOSIS — Z79899 Other long term (current) drug therapy: Secondary | ICD-10-CM | POA: Insufficient documentation

## 2021-03-31 DIAGNOSIS — Z8042 Family history of malignant neoplasm of prostate: Secondary | ICD-10-CM | POA: Diagnosis not present

## 2021-03-31 DIAGNOSIS — Z882 Allergy status to sulfonamides status: Secondary | ICD-10-CM | POA: Diagnosis not present

## 2021-03-31 DIAGNOSIS — Z9049 Acquired absence of other specified parts of digestive tract: Secondary | ICD-10-CM | POA: Insufficient documentation

## 2021-03-31 DIAGNOSIS — Z800 Family history of malignant neoplasm of digestive organs: Secondary | ICD-10-CM | POA: Insufficient documentation

## 2021-03-31 DIAGNOSIS — M810 Age-related osteoporosis without current pathological fracture: Secondary | ICD-10-CM | POA: Insufficient documentation

## 2021-03-31 DIAGNOSIS — Z170 Estrogen receptor positive status [ER+]: Secondary | ICD-10-CM | POA: Insufficient documentation

## 2021-03-31 DIAGNOSIS — C50512 Malignant neoplasm of lower-outer quadrant of left female breast: Secondary | ICD-10-CM | POA: Diagnosis not present

## 2021-03-31 NOTE — Progress Notes (Signed)
[facility] Follow up:    Amy Rea, MD [place] [address]   DIAGNOSIS: Cancer Staging Malignant neoplasm of lower-outer quadrant of left breast of female, estrogen receptor positive ([place]) Staging form: Breast, AJCC 8th Edition - Clinical stage from 09/10/2020: Stage IB (cT2, cN0, cM0, G2, ER+, PR+, HER2-) - Signed by Gardenia Phlegm, NP on 09/22/2020 Stage prefix: Initial diagnosis Histologic grading system: 3 grade system - Pathologic: Stage IA (pT1c, pN0, cM0, G2, ER+, PR+, HER2-) - Signed by Nicholas Lose, MD on 11/04/2020 Stage prefix: Initial diagnosis Histologic grading system: 3 grade system   SUMMARY OF ONCOLOGIC HISTORY: Oncology History  Malignant neoplasm of lower-outer quadrant of left breast of female, estrogen receptor positive ([place])  09/10/2020 Initial Diagnosis   Screening mammogram showed a possible left breast mass, palpable on exam. Diagnostic mammogram and US showed a 1.2cm mass at the 5 o'clock position and no abnormal axillary lymph nodes. Biopsy showed invasive mammary carcinoma, grade 1-2, HER-2 equivocal by IHC (2+), negative by FISH (ratio 1.36), ER+>95%, PR+ 90%, Ki67 5%. B   09/10/2020 Cancer Staging   Staging form: Breast, AJCC 8th Edition - Clinical stage from 09/10/2020: Stage IB (cT2, cN0, cM0, G2, ER+, PR+, HER2-) - Signed by Gardenia Phlegm, NP on 09/22/2020 Stage prefix: Initial diagnosis Histologic grading system: 3 grade system   09/16/2020 Breast MRI   0.6cm mass in the upper outer quadrant, non-mass enhancement in the low outer quadrant, and the known malignancy spanning 2.1cm in the left breast with no evidence of right breast malignancy.    Genetic Testing   Negative genetic testing. No pathogenic variants identified on the [facility] STAT+Multi-Cancer Panel. The report date is 10/04/2020.  The STAT Breast cancer panel offered by [facility] includes sequencing and rearrangement  analysis for the following 9 genes:  ATM, BRCA1, BRCA2, CDH1, CHEK2, PALB2, PTEN, STK11 and TP53.    The Multi-Cancer Panel + RNA offered by [facility] includes sequencing and/or deletion duplication testing of the following 84 genes: AIP, ALK, APC, ATM, AXIN2,BAP1,  BARD1, BLM, BMPR1A, BRCA1, BRCA2, BRIP1, CASR, CDC73, CDH1, CDK4, CDKN1B, CDKN1C, CDKN2A (p14ARF), CDKN2A (p16INK4a), CEBPA, CHEK2, CTNNA1, DICER1, DIS3L2, EGFR (c.2369C>T, p.Thr790Met variant only), EPCAM (Deletion/duplication testing only), FH, FLCN, GATA2, GPC3, GREM1 (Promoter region deletion/duplication testing only), HOXB13 (c.251G>A, p.Gly84Glu), HRAS, KIT, MAX, MEN1, MET, MITF (c.952G>A, p.Glu318Lys variant only), MLH1, MSH2, MSH3, MSH6, MUTYH, NBN, NF1, NF2, NTHL1, PALB2, PDGFRA, PHOX2B, PMS2, POLD1, POLE, POT1, PRKAR1A, PTCH1, PTEN, RAD50, RAD51C, RAD51D, RB1, RECQL4, RET, RUNX1, SDHAF2, SDHA (sequence changes only), SDHB, SDHC, SDHD, SMAD4, SMARCA4, SMARCB1, SMARCE1, STK11, SUFU, TERC, TERT, TMEM127, TP53, TSC1, TSC2, VHL, WRN and WT1.   11/04/2020 Cancer Staging   Staging form: Breast, AJCC 8th Edition - Pathologic: Stage IA (pT1c, pN0, cM0, G2, ER+, PR+, HER2-) - Signed by Nicholas Lose, MD on 11/04/2020 Stage prefix: Initial diagnosis Histologic grading system: 3 grade system   11/17/2020 - 12/15/2020 Radiation Therapy   Adjuvant radiation therapy Site Technique Total Dose (Gy) Dose per Fx (Gy) Completed Fx Beam Energies  Breast, Left: Breast_Lt 3D 40.05/40.05 2.67 15/15 6XFFF  Breast, Left: Breast_Lt_Bst 3D 10/10 2 5/5 6X    03/17/2021 -  Anti-estrogen oral therapy   Declined due to extensive musculoskeletal aches and neuropathic pains     CURRENT THERAPY: (stopped anastrozole)  INTERVAL HISTORY: Amy Stephenson 55 y.o. female returns for evaluation of her persistent left breast pain and chest wall pain following her surgery.  She noted some stranding down her left breast.  She has a PT appt with cancer rehab on 04/15/2021.     Patient Active Problem List   Diagnosis Date Noted   Genetic testing 10/04/2020   Family history of breast cancer    Family history of colon cancer    Family history of prostate cancer    Family history of stomach cancer    Malignant neoplasm of lower-outer quadrant of left breast of female, estrogen receptor positive ([place]) 09/22/2020   Anxiety 09/07/2015    is allergic to penicillins, augmentin [amoxicillin-pot clavulanate], and sulfa antibiotics.  MEDICAL HISTORY: Past Medical History:  Diagnosis Date   Diabetes mellitus without complication (HCC)    Family history of breast cancer    Family history of colon cancer    Family history of prostate cancer    Family history of stomach cancer     SURGICAL HISTORY: Past Surgical History:  Procedure Laterality Date   BREAST LUMPECTOMY WITH RADIOACTIVE SEED AND SENTINEL LYMPH NODE BIOPSY Left 10/15/2020   Procedure: LEFT BREAST LUMPECTOMY WITH RADIOACTIVE SEED AND LEFT AXILLARY SENTINEL LYMPH NODE BIOPSY;  Surgeon: Rolm Bookbinder, MD;  Location: [facility];  Service: General;  Laterality: Left;  104 MINUTES ROOM 8   CHOLECYSTECTOMY     COLONOSCOPY      SOCIAL HISTORY: Social History   Socioeconomic History   Marital status: Married    Spouse name: Not on file   Number of children: Not on file   Years of education: Not on file   Highest education level: Not on file  Occupational History   Not on file  Tobacco Use   Smoking status: Never   Smokeless tobacco: Never  Vaping Use   Vaping Use: Never used  Substance and Sexual Activity   Alcohol use: No   Drug use: Never   Sexual activity: Yes    Birth control/protection: Post-menopausal  Other Topics Concern   Not on file  Social History Narrative   Not on file   Social Determinants of Health   Financial Resource Strain: Not on file  Food Insecurity: Not on file  Transportation Needs: Not on file  Physical Activity: Not on file  Stress: Not on  file  Social Connections: Not on file  Intimate Partner Violence: Not on file    FAMILY HISTORY: Family History  Problem Relation Age of Onset   Breast cancer Mother 31   Colon cancer Father 28   Breast cancer Maternal Aunt        dx 62s, d. 34   Esophageal cancer Paternal Uncle        dx 93s   Stomach cancer Paternal Grandmother 81   Prostate cancer Paternal Grandfather 70   Breast cancer Cousin        dx 30s    Review of Systems  Constitutional:  Negative for appetite change, chills, fatigue, fever and unexpected weight change.  HENT:   Negative for hearing loss, lump/mass and trouble swallowing.   Eyes:  Negative for eye problems and icterus.  Respiratory:  Negative for chest tightness, cough and shortness of breath.   Cardiovascular:  Negative for chest pain, leg swelling and palpitations.  Gastrointestinal:  Negative for abdominal distention, abdominal pain, constipation, diarrhea, nausea and vomiting.  Endocrine: Negative for hot flashes.  Genitourinary:  Negative for difficulty urinating.   Musculoskeletal:  Negative for arthralgias.  Skin:  Negative for itching and rash.  Neurological:  Negative for dizziness, extremity weakness,  headaches and numbness.  Hematological:  Negative for adenopathy. Does not bruise/bleed easily.  Psychiatric/Behavioral:  Negative for depression. The patient is not nervous/anxious.      PHYSICAL EXAMINATION  ECOG PERFORMANCE STATUS: 1 - Symptomatic but completely ambulatory  Vitals:   03/31/21 0903  BP: 112/74  Pulse: 85  Resp: 18  Temp: 97.8 F (36.6 C)  SpO2: 100%    Physical Exam Constitutional:      General: She is not in acute distress.    Appearance: Normal appearance. She is not toxic-appearing.  HENT:     Head: Normocephalic and atraumatic.  Eyes:     General: No scleral icterus. Cardiovascular:     Rate and Rhythm: Normal rate and regular rhythm.     Pulses: Normal pulses.     Heart sounds: Normal heart sounds.   Pulmonary:     Effort: Pulmonary effort is normal.     Breath sounds: Normal breath sounds.     Comments: Left breast s/p lumpectomy and radiation, faint stranding in left lateral breast.  Abdominal:     General: Abdomen is flat. Bowel sounds are normal. There is no distension.     Palpations: Abdomen is soft.     Tenderness: There is no abdominal tenderness.  Musculoskeletal:        General: No swelling.     Cervical back: Neck supple.  Lymphadenopathy:     Cervical: No cervical adenopathy.  Skin:    General: Skin is warm and dry.     Findings: No rash.  Neurological:     General: No focal deficit present.     Mental Status: She is alert.  Psychiatric:        Mood and Affect: Mood normal.        Behavior: Behavior normal.    LABORATORY DATA:  CBC No results found for: WBC, RBC, HGB, HCT, PLT, MCV, MCH, MCHC, RDW, LYMPHSABS, MONOABS, EOSABS, BASOSABS  CMP     Component Value Date/Time   NA 138 10/06/2020 1430   K 4.0 10/06/2020 1430   CL 100 10/06/2020 1430   CO2 30 10/06/2020 1430   GLUCOSE 150 (H) 10/06/2020 1430   BUN 18 10/06/2020 1430   CREATININE 0.66 10/06/2020 1430   CALCIUM 9.7 10/06/2020 1430   GFRNONAA >60 10/06/2020 1430       ASSESSMENT and THERAPY PLAN:   Malignant neoplasm of lower-outer quadrant of left breast of female, estrogen receptor positive ([place]) 09/10/2020: Screening mammogram detected left breast mass later palpable, 1.2 cm at 5 o'clock position, axilla negative, biopsy: Grade 1-2 IDC, ER greater than 95%, PR 90%, Ki-67 5%, HER-2 negative ratio 1.36   09/16/2020: Breast MRI: 0.6 cm mass UOQ, non-mass enhancement LOQ spanning 2.1 cm, MRI guided biopsy is recommended for the indeterminate mass in the non-mass enhancement. 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  She may decide not to take it at all.  She will think about it at a later  time and make a decision finally.   Surveillance:  1.  Breast exam 03/31/2021: Benign 2. mammogram and breast MRI appointments have been scheduled for November.   Breast MRI every 2 to 3 years as needed. 3.  At patient's request we are performing [place] lab test for minimal residual disease and repeating it every 6 months  Patient has persistent diffuse pain and new stranding along left breast.  She could not get in with PT until 10/21.  I have placed orders for breast ultrasound.  We will evaluate with this, and if inconclusive, hopeful the radiologist might weigh in on if any other imaging modality would be helpful, however breast MRI will be completed in November.     RTC for SCP in November  All questions were answered. The patient knows to call the clinic with any problems, questions or concerns. We can certainly see the patient much sooner if necessary.  Total encounter time: 20 minutes in face to face visit time, chart review, lab review, order entry, care coordination and documentation of encounter.    Wilber Bihari, NP 03/31/21 9:46 AM Medical Oncology and Hematology [facility] [place], [place] [address] Tel. [phone]    Fax. [phone]  *Total Encounter Time as defined by the Centers for Medicare and Medicaid Services includes, in addition to the face-to-face time of a patient visit (documented in the note above) non-face-to-face time: obtaining and reviewing outside history, ordering and reviewing medications, tests or procedures, care coordination  (communications with other health care professionals or caregivers) and documentation in the medical record.

## 2021-03-31 NOTE — Assessment & Plan Note (Signed)
09/10/2020: Screening mammogram detected left breast mass later palpable, 1.2 cm at 5 o'clock position, axilla negative, biopsy: Grade 1-2 IDC, ER greater than 95%, PR 90%, Ki-67 5%, HER-2 negative ratio 1.36  09/16/2020: Breast MRI: 0.6 cm mass UOQ, non-mass enhancement LOQ spanning 2.1 cm, MRI guided biopsy is recommended for the indeterminate mass in the non-mass enhancement. 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  She may decide not to take it at all.  She will think about it at a later time and make a decision finally.  Surveillance:  1.  Breast exam 03/31/2021: Benign 2. mammogram and breast MRI appointments have been scheduled for November.   Breast MRI every 2 to 3 years as needed. 3.  At patient's request we are performing [place] lab test for minimal residual disease and repeating it every 6 months  Patient has persistent diffuse pain and new stranding along left breast.  She could not get in with PT until 10/21.  I have placed orders for breast ultrasound.  We will evaluate with this, and if inconclusive, hopeful the radiologist might weigh in on if any other imaging modality would be helpful, however breast MRI will be completed in November.    RTC for SCP in November

## 2021-04-04 ENCOUNTER — Ambulatory Visit
Admission: RE | Admit: 2021-04-04 | Discharge: 2021-04-04 | Disposition: A | Discharge status: Home / Self Care | Payer: BC Managed Care – PPO | Source: Ambulatory Visit | Attending: Hematology and Oncology | Admitting: Hematology and Oncology

## 2021-04-04 ENCOUNTER — Other Ambulatory Visit: Payer: Self-pay

## 2021-04-04 ENCOUNTER — Ambulatory Visit
Admission: RE | Admit: 2021-04-04 | Discharge: 2021-04-04 | Disposition: A | Discharge status: Home / Self Care | Payer: BC Managed Care – PPO | Source: Ambulatory Visit | Attending: Adult Health | Admitting: Adult Health

## 2021-04-04 DIAGNOSIS — C50512 Malignant neoplasm of lower-outer quadrant of left female breast: Secondary | ICD-10-CM

## 2021-04-04 DIAGNOSIS — Z170 Estrogen receptor positive status [ER+]: Secondary | ICD-10-CM

## 2021-04-04 HISTORY — DX: Personal history of irradiation: Z92.3

## 2021-04-04 IMAGING — MG MM DIGITAL DIAGNOSTIC UNILAT*L* W/ TOMO W/ CAD
5 series · 6 of 13 positions shown · non-contrast
Comparison: Previous exam(s).

CLINICAL DATA: Patient with interval left breast lumpectomy [DATE] now presenting with pain throughout the lateral left breast and
skin changes at the lumpectomy site.

EXAM:
DIGITAL DIAGNOSTIC UNILATERAL LEFT MAMMOGRAM WITH TOMOSYNTHESIS AND
CAD; ULTRASOUND LEFT BREAST LIMITED
TECHNIQUE: Left digital diagnostic mammography and breast tomosynthesis was
performed. The images were evaluated with computer-aided detection.;
Targeted ultrasound examination of the left breast was performed.

[L MLO]
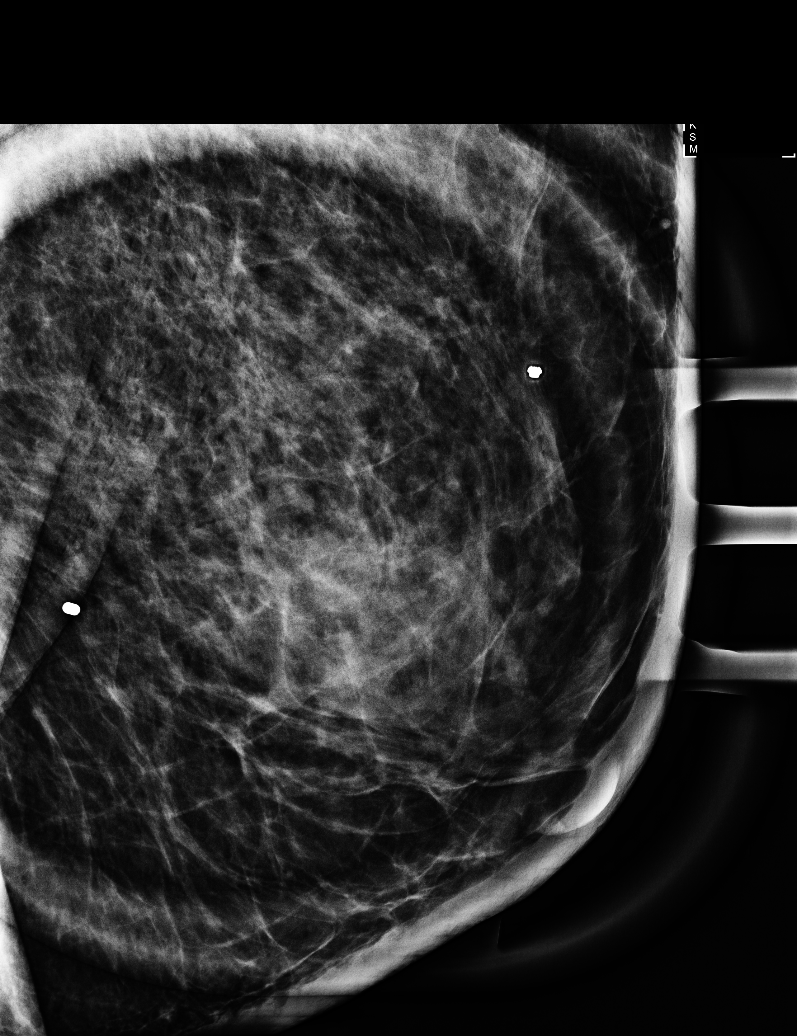

[L CC synth-2D]
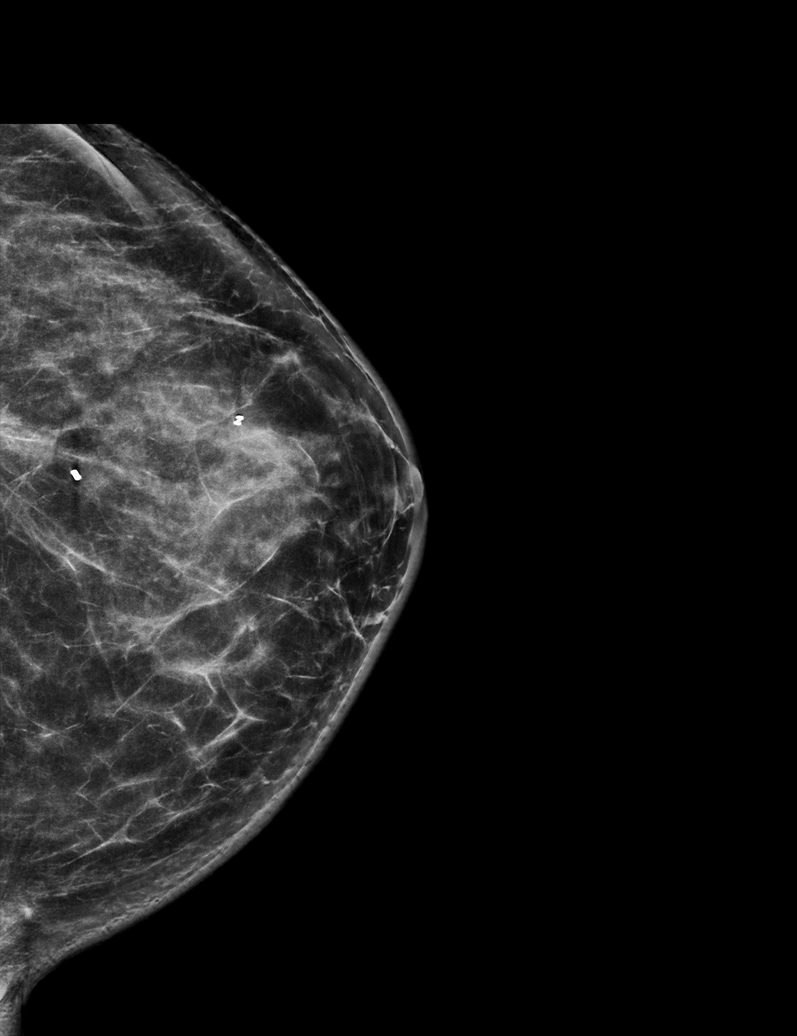

[L MLO synth-2D]
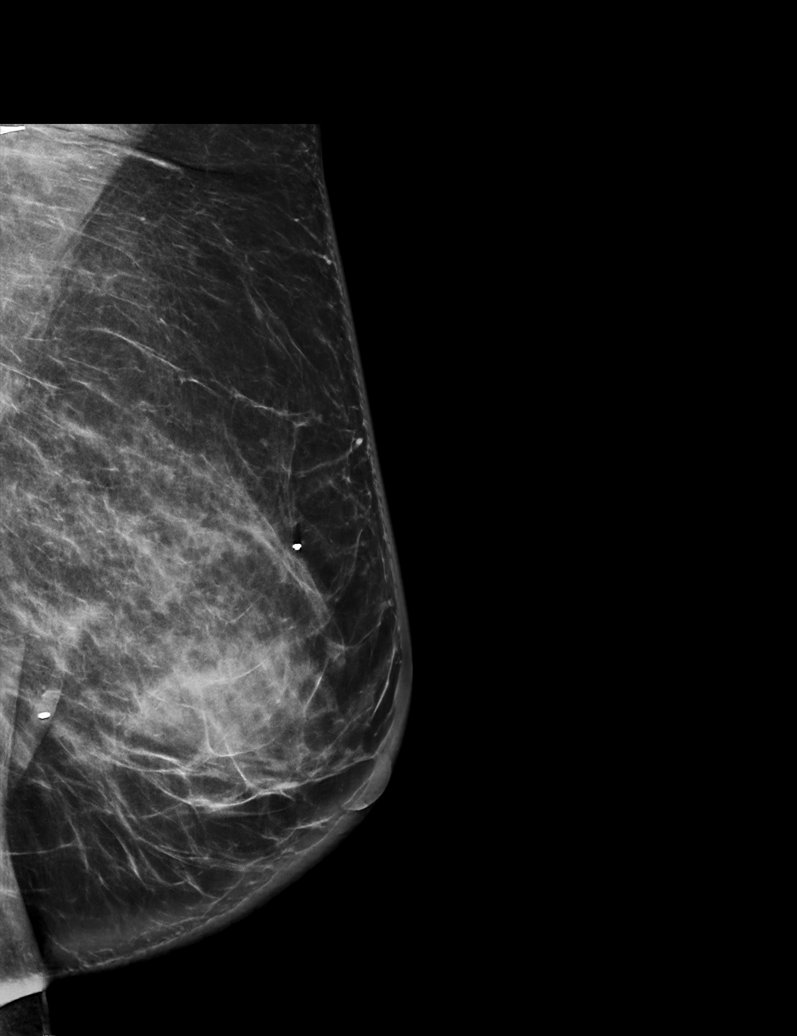

[L MLO tomo · 2 of 84 frames shown]
[frame 28/84]
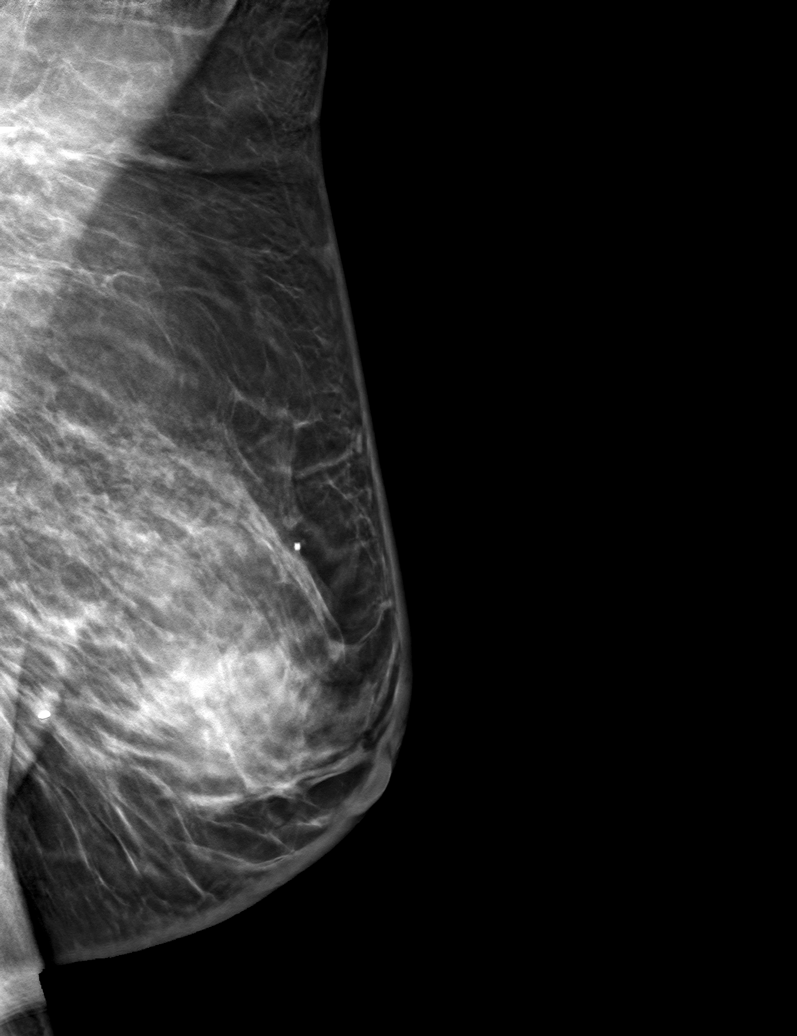
[frame 43/84]
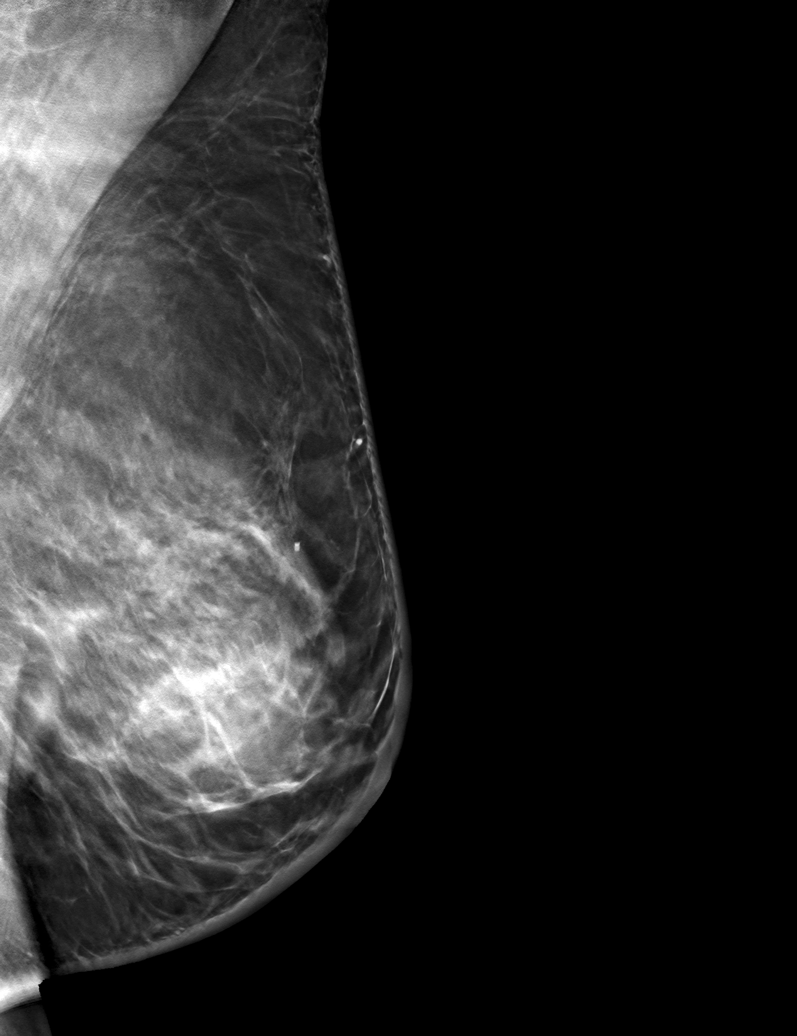

[L CC tomo · tomo slice 41/81.0]
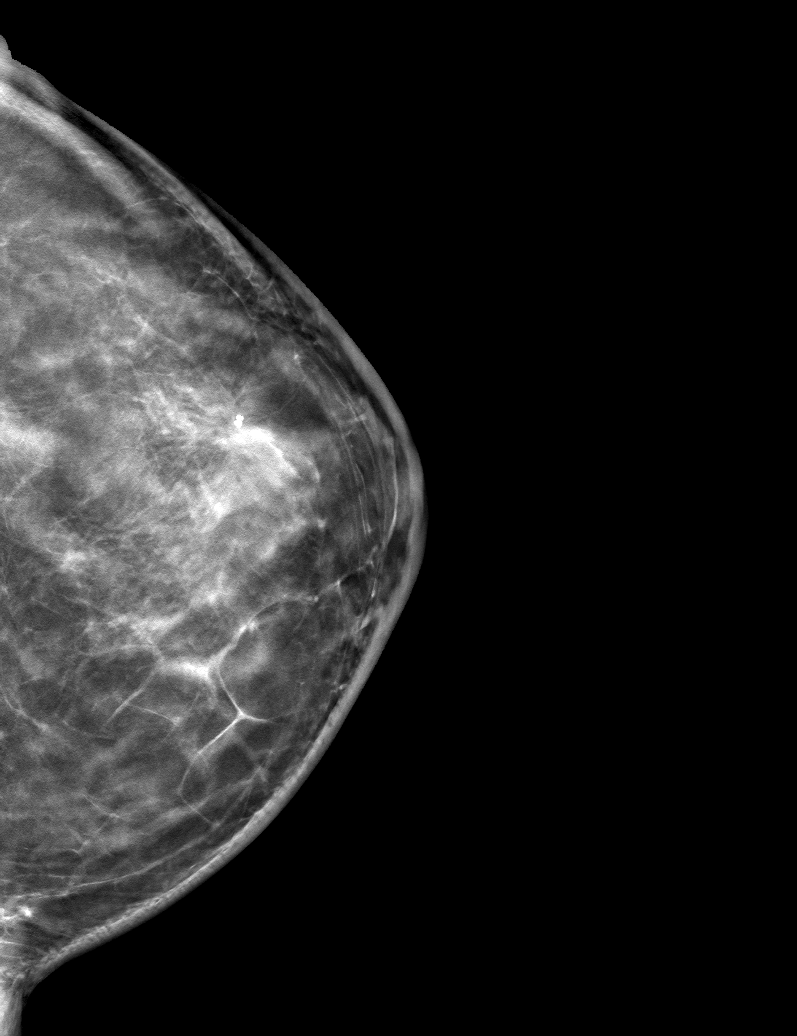

[6 of 13 positions shown; findings below may reference images not displayed]

ACR Breast Density Category c: The breast tissue is heterogeneously
dense, which may obscure small masses.
FINDINGS: Interval postlumpectomy changes posterior left breast. No new
masses, calcifications or nonsurgical distortion identified within
breast.

On physical exam, there is mild thickening of the skin particularly
within the lateral left breast.

Targeted ultrasound is performed, showing normal tissue without
suspicious mass within the outer left breast at the site of
tenderness and cutaneous changes. No suspicious findings identified.
IMPRESSION: No mammographic evidence for malignancy.

No suspicious abnormality within the outer left breast.

RECOMMENDATION:
Patient currently has bilateral breast MRI scheduled. Recommend
completing this examination for further evaluation of the clinical
findings within the left breast.

Continue with annual diagnostic mammography [DATE].

I have discussed the findings and recommendations with the patient.
If applicable, a reminder letter will be sent to the patient
regarding the next appointment.

BI-RADS CATEGORY  2: Benign.

## 2021-04-04 IMAGING — US US BREAST*L* LIMITED INC AXILLA
1 series · 1 of 1 positions shown · non-contrast
Comparison: Previous exam(s).

CLINICAL DATA: Patient with interval left breast lumpectomy [DATE] now presenting with pain throughout the lateral left breast and
skin changes at the lumpectomy site.

EXAM:
DIGITAL DIAGNOSTIC UNILATERAL LEFT MAMMOGRAM WITH TOMOSYNTHESIS AND
CAD; ULTRASOUND LEFT BREAST LIMITED
TECHNIQUE: Left digital diagnostic mammography and breast tomosynthesis was
performed. The images were evaluated with computer-aided detection.;
Targeted ultrasound examination of the left breast was performed.

[Series 1: us breast*left* limited inc axilla · 0.07mm/px · 1 of 1 slices shown]
[im 1/1]
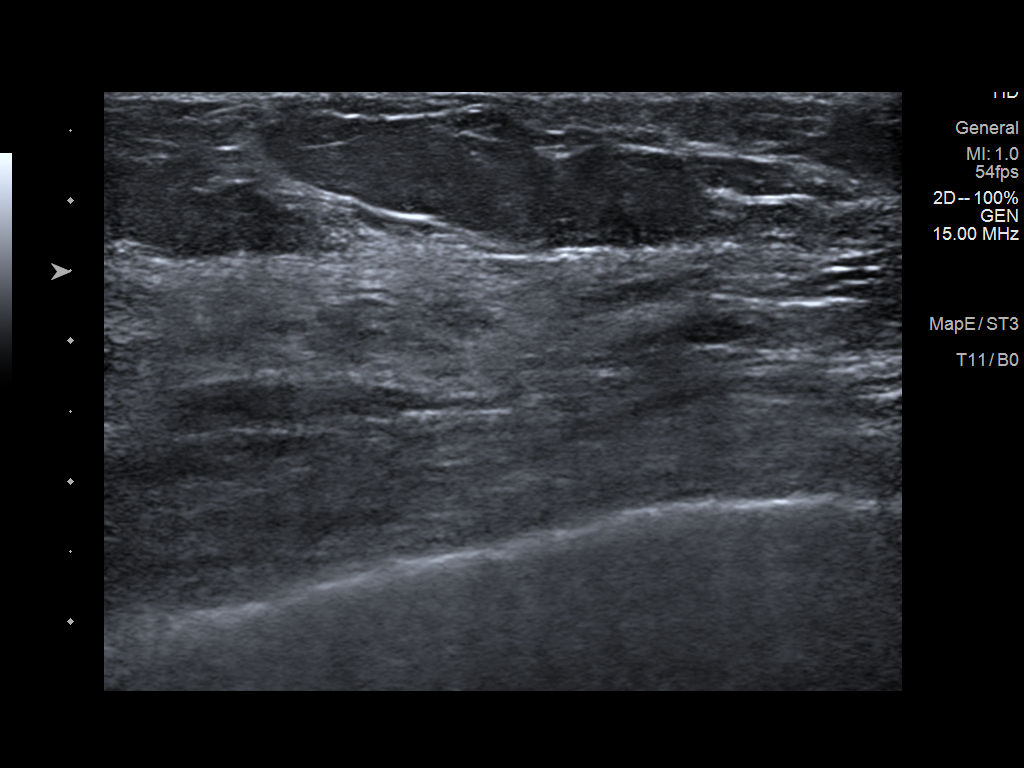

[1 of 1 positions shown; findings below may reference images not displayed]

ACR Breast Density Category c: The breast tissue is heterogeneously
dense, which may obscure small masses.
FINDINGS: Interval postlumpectomy changes posterior left breast. No new
masses, calcifications or nonsurgical distortion identified within
breast.

On physical exam, there is mild thickening of the skin particularly
within the lateral left breast.

Targeted ultrasound is performed, showing normal tissue without
suspicious mass within the outer left breast at the site of
tenderness and cutaneous changes. No suspicious findings identified.
IMPRESSION: No mammographic evidence for malignancy.

No suspicious abnormality within the outer left breast.

RECOMMENDATION:
Patient currently has bilateral breast MRI scheduled. Recommend
completing this examination for further evaluation of the clinical
findings within the left breast.

Continue with annual diagnostic mammography [DATE].

I have discussed the findings and recommendations with the patient.
If applicable, a reminder letter will be sent to the patient
regarding the next appointment.

BI-RADS CATEGORY  2: Benign.

## 2021-04-15 ENCOUNTER — Other Ambulatory Visit: Payer: Self-pay

## 2021-04-15 ENCOUNTER — Ambulatory Visit: Payer: BC Managed Care – PPO | Attending: General Surgery

## 2021-04-15 DIAGNOSIS — Z483 Aftercare following surgery for neoplasm: Secondary | ICD-10-CM | POA: Diagnosis not present

## 2021-04-15 DIAGNOSIS — Z170 Estrogen receptor positive status [ER+]: Secondary | ICD-10-CM | POA: Diagnosis present

## 2021-04-15 DIAGNOSIS — R600 Localized edema: Secondary | ICD-10-CM | POA: Insufficient documentation

## 2021-04-15 DIAGNOSIS — R293 Abnormal posture: Secondary | ICD-10-CM | POA: Diagnosis present

## 2021-04-15 DIAGNOSIS — C50512 Malignant neoplasm of lower-outer quadrant of left female breast: Secondary | ICD-10-CM | POA: Diagnosis present

## 2021-04-15 NOTE — Patient Instructions (Signed)
Pt given script for compression bra address and phone number of [employer]

## 2021-04-15 NOTE — Therapy (Signed)
[facility] @ [facility] [address] [place], [place], [address] Phone: [phone]   Fax:  [phone]  Physical Therapy Evaluation  Patient Details  Name: Amy Stephenson MRN: [id number] Date of Birth: 11/27/1965 Referring Provider (Amy Stephenson): Dr. Squire   Encounter Date: 04/15/2021   Amy Stephenson End of Session - 04/15/21 1000     Visit Number 1    Number of Visits 13    Date for Amy Stephenson Re-Evaluation 05/27/21    Amy Stephenson Start Time 0903    Amy Stephenson Stop Time 0955    Amy Stephenson Time Calculation (min) 52 min    Activity Tolerance Patient tolerated treatment well    Behavior During Therapy WFL for tasks assessed/performed             Past Medical History:  Diagnosis Date   Diabetes mellitus without complication (HCC)    Family history of breast cancer    Family history of colon cancer    Family history of prostate cancer    Family history of stomach cancer    Personal history of radiation therapy     Past Surgical History:  Procedure Laterality Date   BREAST LUMPECTOMY     BREAST LUMPECTOMY WITH RADIOACTIVE SEED AND SENTINEL LYMPH NODE BIOPSY Left 10/15/2020   Procedure: LEFT BREAST LUMPECTOMY WITH RADIOACTIVE SEED AND LEFT AXILLARY SENTINEL LYMPH NODE BIOPSY;  Surgeon: Wakefield, Matthew, MD;  Location: [facility];  Service: General;  Laterality: Left;  90 MINUTES ROOM 8   CHOLECYSTECTOMY     COLONOSCOPY      There were no vitals filed for this visit.    Subjective Assessment - 04/15/21 0909     Subjective The entire left side of my body is bothering me.  I have a torn RTC tendon, sciatica, and a pinched nerve in my neck. I haave Amy Stephenson at [facility] for those things. I am here to address the breast where I have developed some white streaks that come and go on the left..  I still hurt in the lateral ribs and armpit and sometimes the scapular area. I had an US and mammogram and they were clear. Stranding(white marks) was noticed about a  month ago. Amy Stephenson wears a sports bra that is very comfortable for her and the support feels good, but Amy Stephenson has to pull it over her head which is difficult.    Pertinent History left breast cancer found on mammogram 08/2020   Amy Stephenson underwent Left Lumpectomy with SLNB on 10/15/2020 and was diagnosed with Gr. 2 IDC ER+, PR+, Her 2 - with Ki67 5%.Amy Stephenson  had radiation from 11/17/2020 - 12/15/2020.  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  presently being treated for sciatic pain and nerve pain in arm.  Amy Stephenson has a small left shoulder RTC tear    Patient Stated Goals Be able to feel comfortable that I am doing the right things, decrease pain, get rid of strands    Currently in Pain? Yes    Pain Score 4     Pain Location Breast    Pain Orientation Left    Pain Descriptors / Indicators Dull;Sore;Aching    Pain Type Acute pain    Pain Onset 1 to 4 weeks ago    Aggravating Factors  laying on left side      Pain Relieving Factors sports bra    Effect of Pain on Daily Activities I do what I want to do, but if I do too much it makes the area really hurt.                [facility] Amy Stephenson Assessment - 04/15/21 0001       Assessment   Medical Diagnosis Left breast CA    Referring Provider (Amy Stephenson) Dr. Squire    Onset Date/Surgical Date 10/15/20      Precautions   Precaution Comments lymphedema risk      Restrictions   Weight Bearing Restrictions No      Balance Screen   Has the patient fallen in the past 6 months No    Has the patient had a decrease in activity level because of a fear of falling?  No    Is the patient reluctant to leave their home because of a fear of falling?  No      Home Environment   Living Environment Private residence      Prior Function   Level of Independence Independent    Vocation Unemployed    Leisure walking dog daily      Cognition    Overall Cognitive Status Within Functional Limits for tasks assessed      Observation/Other Assessments   Observations white lines noted left breast that appear like stretch marks and are present also on right breast.  took a picture with pts. phone to show her. Some enlarged pores noted especially medial breast and fullness noted at lateral breast/trunk area      Posture/Postural Control   Posture/Postural Control Postural limitations    Postural Limitations Forward head      AROM   Right Shoulder Flexion 165 Degrees    Right Shoulder ABduction 175 Degrees    Left Shoulder Flexion 165 Degrees   pulls under arm   Left Shoulder ABduction 175 Degrees      Palpation   Palpation comment tender axillary border of pecs, lats, scapular region/UT                        Objective measurements completed on examination: See above findings.                Amy Stephenson Education - 04/15/21 0957     Education Details Compression bra info, [employer], script given    Person(s) Educated Patient    Methods Explanation;Handout    Comprehension Verbalized understanding                 Amy Stephenson Long Term Goals - 04/15/21 1255       Amy Stephenson LONG TERM GOAL #1   Title Amy Stephenson will be independent in Self left breast MLD    Time 6    Period Weeks    Target Date 05/27/21      Amy Stephenson LONG TERM GOAL #2   Title Amy Stephenson will be fit for compression bra and will note decreased left breast discomfort while wearing    Time 6    Period Weeks    Status New    Target Date 05/27/21      Amy Stephenson LONG TERM GOAL #3   Title Amy Stephenson will have decreased left upper quarter discomfort by atleast 50%    Time 6    Period Weeks    Status New    Target Date 05/27/21                      Plan - 04/15/21 1245     Clinical Impression Statement Amy Stephenson came to therapy concerned about white strand like marks at the left lateral breast that seem to appear and disappear.  Amy Stephenson had [place] and mammography and both were  fine. Amy Stephenson has some left breast and lateral trunk discomfort and feels better when wearing her sports bra.  When assessed today "stranding" was noted and looked like light stretch marks.  They could be palpated slightly.  Right breast was observed and had the same light stretch mark appearing area.  A photo was taken to show the Amy Stephenson.  Explained that the left side appears to change and be more prominent at times due to left breast swelling, and that Amy Stephenson does not appear to have any cording there. Amy Stephenson does have tightness/tenderness in pectorals/lats, scapular area and UT.  Amy Stephenson would benefit from skilled Amy Stephenson to address tightness and breast swelling. A small 1/2 in foam pad was made to place under the band of her sports bra to decrease rib irritation.    Personal Factors and Comorbidities Comorbidity 2    Comorbidities left breast cancer, DM    Examination-Activity Limitations Lift;Reach Overhead    Stability/Clinical Decision Making Stable/Uncomplicated    Clinical Decision Making Low    Rehab Potential Excellent    Amy Stephenson Frequency 2x / week    Amy Stephenson Duration 6 weeks    Amy Stephenson Treatment/Interventions ADLs/Self Care Home Management;Therapeutic exercise;Therapeutic activities;Patient/family education    Amy Stephenson Next Visit Plan SOZO next, compression bra? Foam help sports bra,STM pecs/lats/scap area Traps, left breast MLD and instruct Amy Stephenson.    Consulted and Agree with Plan of Care Patient             Patient will benefit from skilled therapeutic intervention in order to improve the following deficits and impairments:  Increased edema, Postural dysfunction, Pain, Decreased knowledge of precautions, Decreased activity tolerance, Impaired UE functional use, Increased fascial restricitons  Visit Diagnosis: Aftercare following surgery for neoplasm  Localized edema  Abnormal posture  Malignant neoplasm of lower-outer quadrant of left breast of female, estrogen receptor positive ([place])     Problem List Patient Active  Problem List   Diagnosis Date Noted   Genetic testing 10/04/2020   Family history of breast cancer    Family history of colon cancer    Family history of prostate cancer    Family history of stomach cancer    Malignant neoplasm of lower-outer quadrant of left breast of female, estrogen receptor positive ([place]) 09/22/2020   Anxiety 09/07/2015    [name], Amy Stephenson 04/15/2021, 12:58 PM  [facility] @ [place], [place], [address] Phone: [phone]   Fax:  [phone]  Name: Amy Stephenson MRN: [id number] Date of Birth: 12/15/1965

## 2021-04-18 ENCOUNTER — Ambulatory Visit: Payer: BC Managed Care – PPO

## 2021-04-18 ENCOUNTER — Other Ambulatory Visit: Payer: Self-pay

## 2021-04-18 DIAGNOSIS — Z483 Aftercare following surgery for neoplasm: Secondary | ICD-10-CM | POA: Diagnosis not present

## 2021-04-18 DIAGNOSIS — C50512 Malignant neoplasm of lower-outer quadrant of left female breast: Secondary | ICD-10-CM

## 2021-04-18 DIAGNOSIS — R600 Localized edema: Secondary | ICD-10-CM

## 2021-04-18 DIAGNOSIS — R293 Abnormal posture: Secondary | ICD-10-CM

## 2021-04-18 DIAGNOSIS — Z170 Estrogen receptor positive status [ER+]: Secondary | ICD-10-CM

## 2021-04-18 NOTE — Therapy (Signed)
[facility] @ [place], [place], [address] Phone: [phone]   Fax:  [phone]  Physical Therapy Treatment  Patient Details  Name: Amy Stephenson MRN: [id number] Date of Birth: [birth year] Referring Provider (PT): Dr. Isidore Moos   Encounter Date: 04/18/2021   PT End of Session - 04/18/21 1155     Visit Number 2    Number of Visits 13    Date for PT Re-Evaluation 05/27/21    PT Start Time 1100    PT Stop Time 1150    PT Time Calculation (min) 50 min    Activity Tolerance Patient tolerated treatment well    Behavior During Therapy [facility] for tasks assessed/performed             Past Medical History:  Diagnosis Date   Diabetes mellitus without complication ([place])    Family history of breast cancer    Family history of colon cancer    Family history of prostate cancer    Family history of stomach cancer    Personal history of radiation therapy     Past Surgical History:  Procedure Laterality Date   BREAST LUMPECTOMY     BREAST LUMPECTOMY WITH RADIOACTIVE SEED AND SENTINEL LYMPH NODE BIOPSY Left 10/15/2020   Procedure: LEFT BREAST LUMPECTOMY WITH RADIOACTIVE SEED AND LEFT AXILLARY SENTINEL LYMPH NODE BIOPSY;  Surgeon: Rolm Bookbinder, MD;  Location: [facility];  Service: General;  Laterality: Left;  90 MINUTES ROOM 8   CHOLECYSTECTOMY     COLONOSCOPY      There were no vitals filed for this visit.   Subjective Assessment - 04/18/21 1102     Subjective I have the foam in my bra and it feels better. I still feel tight in the left chest and I do think its swollen    Pertinent History left breast cancer found on mammogram 08/2020   She underwent Left Lumpectomy with SLNB on 10/15/2020 and was diagnosed with Gr. 2 IDC ER+, PR+, Her 2 - with Ki67 5%.She  had radiation from 11/17/2020 - 12/15/2020.  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  presently being treated for sciatic pain and nerve pain in arm.  She has a small left shoulder RTC tear    Patient Stated Goals Be able to feel comfortable that I am doing the right things, decrease pain, get rid of strands    Currently in Pain? Yes    Pain Score 3     Pain Location Chest    Pain Descriptors / Indicators Aching;Tender;Sore    Pain Type Acute pain    Pain Onset 1 to 4 weeks ago    Pain Frequency Intermittent    Multiple Pain Sites No                    L-DEX FLOWSHEETS - 04/18/21 1100       L-DEX LYMPHEDEMA SCREENING   Measurement Type Unilateral    L-DEX MEASUREMENT EXTREMITY Upper Extremity    POSITION  Standing    DOMINANT SIDE Right    At Risk Side Left    BASELINE SCORE (UNILATERAL) 0.1    L-DEX SCORE (UNILATERAL) 0.2    VALUE CHANGE (UNILAT) 0.1  [facility] Adult PT Treatment/Exercise - 04/18/21 0001       Exercises   Other Exercises  lower trunk rotation to the right with arms in ER x 5      Manual Therapy   Soft tissue mobilization soft tissue work to left pectorals/Lats in supine and in SL to lats, UT and scapular area. scar mobilization to inferior breast incision    Manual Lymphatic Drainage (MLD) MLD performed to left breast with verbalization as to where lymphatics are, gentle stretch as performing etc  short neck, Bilateral axillary LN's, anterior interaxillary pathway, left axillo-inguinal pathway and left breast directing to pathways and retracing all steps                          PT Long Term Goals - 04/15/21 1255       PT LONG TERM GOAL #1   Title Pt will be independent in Self left breast MLD    Time 6    Period Weeks    Target Date 05/27/21      PT LONG TERM GOAL #2   Title Pt will be fit for compression bra and will note decreased left breast discomfort while wearing    Time 6     Period Weeks    Status New    Target Date 05/27/21      PT LONG TERM GOAL #3   Title Pt will have decreased left upper quarter discomfort by atleast 50%    Time 6    Period Weeks    Status New    Target Date 05/27/21                   Plan - 04/18/21 1156     Clinical Impression Statement Performed SOZO at pt request with good result noted. initiated soft tissue mobilization to left upper quarter including pecs, lats, UT, levator and scapular area, and scar mobilization to inferior breast incision. Performed left breast MLD with instruction to pt as performing. No fibrosis noted but stretch marks at breast look fuller than when seen on Friday.  Pt is making an appt to get compression bra    Personal Factors and Comorbidities Comorbidity 2    Comorbidities left breast cancer, DM    Examination-Activity Limitations Lift;Reach Overhead    Stability/Clinical Decision Making Stable/Uncomplicated    Rehab Potential Excellent    PT Frequency 2x / week    PT Duration 6 weeks    PT Treatment/Interventions ADLs/Self Care Home Management;Therapeutic exercise;Therapeutic activities;Patient/family education    PT Next Visit Plan STM pecs/lats/scap area Traps, left breast MLD and instruct pt. next , set up 3 month SOZO   Consulted and Agree with Plan of Care Patient             Patient will benefit from skilled therapeutic intervention in order to improve the following deficits and impairments:  Increased edema, Postural dysfunction, Pain, Decreased knowledge of precautions, Decreased activity tolerance, Impaired UE functional use, Increased fascial restricitons  Visit Diagnosis: Aftercare following surgery for neoplasm  Localized edema  Abnormal posture  Malignant neoplasm of lower-outer quadrant of left breast of female, estrogen receptor positive ([place])     Problem List Patient Active Problem List   Diagnosis Date Noted   Genetic testing 10/04/2020   Family history of  breast cancer    Family history of colon cancer    Family history of prostate cancer    Family history of  stomach cancer    Malignant neoplasm of lower-outer quadrant of left breast of female, estrogen receptor positive ([place]) 09/22/2020   Anxiety 09/07/2015    Amy Stephenson, PT 04/18/2021, 11:59 AM  [facility] @ [place], [place], [address] Phone: [phone]   Fax:  [phone]  Name: LARIAH FLEER MRN: [id number] Date of Birth: 1965/11/23

## 2021-04-20 ENCOUNTER — Encounter: Payer: Self-pay | Admitting: Hematology and Oncology

## 2021-04-20 ENCOUNTER — Ambulatory Visit: Payer: BC Managed Care – PPO

## 2021-04-20 ENCOUNTER — Other Ambulatory Visit: Payer: Self-pay

## 2021-04-20 DIAGNOSIS — Z483 Aftercare following surgery for neoplasm: Secondary | ICD-10-CM | POA: Diagnosis not present

## 2021-04-20 DIAGNOSIS — C50512 Malignant neoplasm of lower-outer quadrant of left female breast: Secondary | ICD-10-CM

## 2021-04-20 DIAGNOSIS — R293 Abnormal posture: Secondary | ICD-10-CM

## 2021-04-20 DIAGNOSIS — R600 Localized edema: Secondary | ICD-10-CM

## 2021-04-20 NOTE — Therapy (Signed)
[facility] @ [facility] [place], [place], [address] Phone: [phone]   Fax:  [phone]  Physical Therapy Treatment  Patient Details  Name: Amy Stephenson MRN: [id number] Date of Birth: 1965-12-14 Referring Provider (PT): Dr. Isidore Moos   Encounter Date: 04/20/2021   PT End of Session - 04/20/21 1405     Visit Number 3    Number of Visits 13    Date for PT Re-Evaluation 05/27/21    PT Start Time 1304    PT Stop Time 7622    PT Time Calculation (min) 55 min    Activity Tolerance Patient tolerated treatment well    Behavior During Therapy [facility] for tasks assessed/performed             Past Medical History:  Diagnosis Date   Diabetes mellitus without complication ([place])    Family history of breast cancer    Family history of colon cancer    Family history of prostate cancer    Family history of stomach cancer    Personal history of radiation therapy     Past Surgical History:  Procedure Laterality Date   BREAST LUMPECTOMY     BREAST LUMPECTOMY WITH RADIOACTIVE SEED AND SENTINEL LYMPH NODE BIOPSY Left 10/15/2020   Procedure: LEFT BREAST LUMPECTOMY WITH RADIOACTIVE SEED AND LEFT AXILLARY SENTINEL LYMPH NODE BIOPSY;  Surgeon: Rolm Bookbinder, MD;  Location: [facility];  Service: General;  Laterality: Left;  90 MINUTES ROOM 8   CHOLECYSTECTOMY     COLONOSCOPY      There were no vitals filed for this visit.   Subjective Assessment - 04/20/21 1309     Subjective I got 2 compression bras and I really like it.  It feels really good.  I had put the foam in my bra and it slid up and it definiteley helped with the swelling. My axillary region is still really tender but it felt good after you worked on it.    Pertinent History left breast cancer found on mammogram 08/2020   She underwent Left Lumpectomy with SLNB on 10/15/2020 and was diagnosed with Gr. 2 IDC ER+, PR+, Her 2 - with Ki67 5%.She  had radiation  from 11/17/2020 - 12/15/2020.  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  presently being treated for sciatic pain and nerve pain in arm.  She has a small left shoulder RTC tear    Patient Stated Goals Be able to feel comfortable that I am doing the right things, decrease pain, get rid of strands    Currently in Pain? Yes    Pain Score 4     Pain Location Axilla    Pain Orientation Left    Pain Descriptors / Indicators Sore;Tender    Pain Type Acute pain                               [facility] Adult PT Treatment/Exercise - 04/20/21 0001       Shoulder Exercises: Supine   Other Supine Exercises supine wand scaption x 5      Manual Therapy   Soft tissue mobilization soft tissue work to left pectorals/Lats in supine and in SL to lats and scapular area. scar mobilization to inferior breast incision  Manual Lymphatic Drainage (MLD) MLD performed by pt. to left breast with verbalization and instruction by PT.  short neck, Bilateral axillary LN's, anterior interaxillary pathway, left axillo-inguinal pathway and left breast directing to pathways and retracing all steps and ending with LN's                          PT Long Term Goals - 04/15/21 1255       PT LONG TERM GOAL #1   Title Pt will be independent in Self left breast MLD    Time 6    Period Weeks    Target Date 05/27/21      PT LONG TERM GOAL #2   Title Pt will be fit for compression bra and will note decreased left breast discomfort while wearing    Time 6    Period Weeks    Status New    Target Date 05/27/21      PT LONG TERM GOAL #3   Title Pt will have decreased left upper quarter discomfort by atleast 50%    Time 6    Period Weeks    Status New    Target Date 05/27/21                   Plan - 04/20/21 1406     Clinical Impression  Statement Pt purchased compression bras and noted that the support  feels really good. Pt continues with tightness and tenderness at pectorals and lats. Pt was instructed in Self breast MLD and required Verbal and tactile cues but did exceptionally well overall. She was given a handout and will practice at home.  She used good stretch and technique after cueing.    Personal Factors and Comorbidities Comorbidity 2    Comorbidities left breast cancer, DM    Examination-Activity Limitations Lift;Reach Overhead    Stability/Clinical Decision Making Stable/Uncomplicated    Rehab Potential Excellent    PT Frequency 2x / week    PT Duration 6 weeks    PT Treatment/Interventions ADLs/Self Care Home Management;Therapeutic exercise;Therapeutic activities;Patient/family education    PT Next Visit Plan STM pecs/lats/scap area Traps, lat stretches, left breast MLD and review with pt again, AROM flex,scap, HA, scar mobs,supine scap series    PT Home Exercise Plan left breast MLD    Consulted and Agree with Plan of Care Patient             Patient will benefit from skilled therapeutic intervention in order to improve the following deficits and impairments:  Increased edema, Postural dysfunction, Pain, Decreased knowledge of precautions, Decreased activity tolerance, Impaired UE functional use, Increased fascial restricitons  Visit Diagnosis: Localized edema  Abnormal posture  Aftercare following surgery for neoplasm  Malignant neoplasm of lower-outer quadrant of left breast of female, estrogen receptor positive ([place])     Problem List Patient Active Problem List   Diagnosis Date Noted   Genetic testing 10/04/2020   Family history of breast cancer    Family history of colon cancer    Family history of prostate cancer    Family history of stomach cancer    Malignant neoplasm of lower-outer quadrant of left breast of female, estrogen receptor positive ([place]) 09/22/2020   Anxiety 09/07/2015     [name], PT 04/20/2021, 2:11 PM  [facility] @ [place], [place], [address] Phone: [phone]   Fax:  [phone]  Name: Delma  LATITIA HOUSEWRIGHT MRN: [id number] Date of Birth: 04-05-1966

## 2021-04-21 DIAGNOSIS — E119 Type 2 diabetes mellitus without complications: Secondary | ICD-10-CM | POA: Insufficient documentation

## 2021-04-27 ENCOUNTER — Other Ambulatory Visit: Payer: BC Managed Care – PPO

## 2021-04-27 ENCOUNTER — Ambulatory Visit
Admission: RE | Admit: 2021-04-27 | Discharge: 2021-04-27 | Disposition: A | Discharge status: Home / Self Care | Payer: BC Managed Care – PPO | Source: Ambulatory Visit | Attending: Hematology and Oncology | Admitting: Hematology and Oncology

## 2021-04-27 ENCOUNTER — Other Ambulatory Visit: Payer: Self-pay

## 2021-04-27 DIAGNOSIS — C50512 Malignant neoplasm of lower-outer quadrant of left female breast: Secondary | ICD-10-CM

## 2021-04-27 DIAGNOSIS — Z170 Estrogen receptor positive status [ER+]: Secondary | ICD-10-CM

## 2021-04-27 IMAGING — MR MR BREAST BILAT WO/W CM
8 of 12 series · 32 of 48 positions shown · IV contrast (gadavist)
Comparison: [DATE] and previous MRI [DATE]

CLINICAL DATA: History invasive mammary carcinoma, grade 1-2 in the
5 o'clock location of the LEFT breast. Patient had MR guided core
biopsy of 2 sites in the LEFT breast. In the UPPER-OUTER QUADRANT at
the barbell clip, pathology showed adenosis with apocrine metaplasia
and calcifications. In the LOWER OUTER QUADRANT of the LEFT breast
at the cylinder clip, pathology showed adenosis with apocrine
metaplasia and calcifications. Follow-up from benign biopsies.
Patient has now undergone lumpectomy with radiation.

LABS:  None obtained at the time of imaging.
EXAM:
BILATERAL BREAST MRI WITH AND WITHOUT CONTRAST
TECHNIQUE: Multiplanar, multisequence MR images of both breasts were obtained
prior to and following the intravenous administration of 7 ml of
Gadavist

[Series 2: t2_tirm_tra ipat (a-p) · axial · 3.0mm · 0.70mm/px · 1 of 55 slices shown]
[im 1/55]
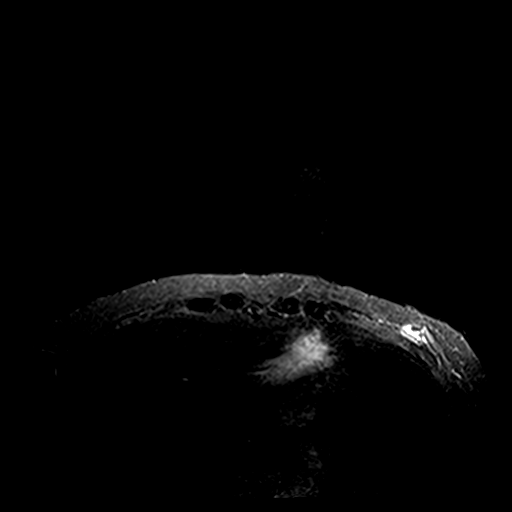

[Series 3: fl3d pre-cm no · axial · non-contrast · 1.2mm · 0.89mm/px · z∈[-74,+97]mm · 5 of 144 slices shown]
[im 1/144]
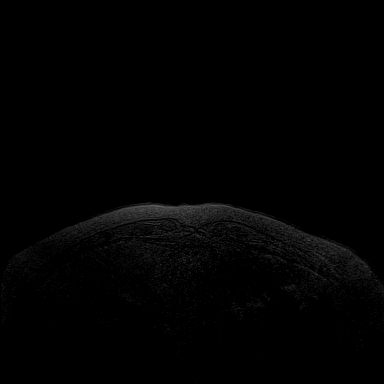
[im 36/144]
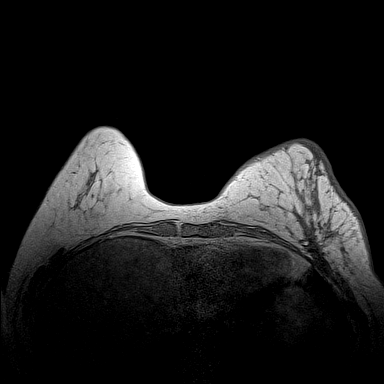
[im 72/144]
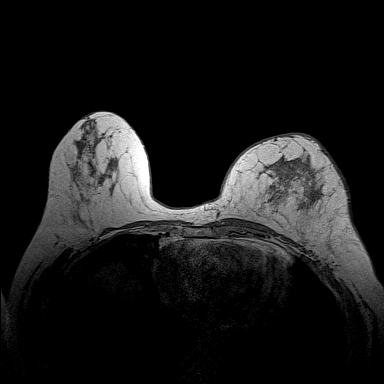
[im 108/144]
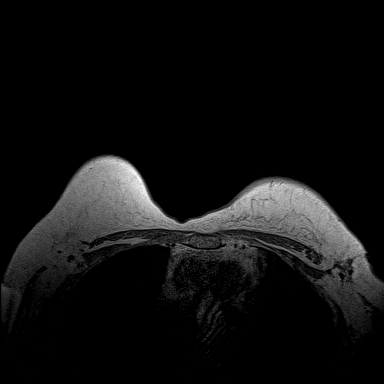
[im 144/144]
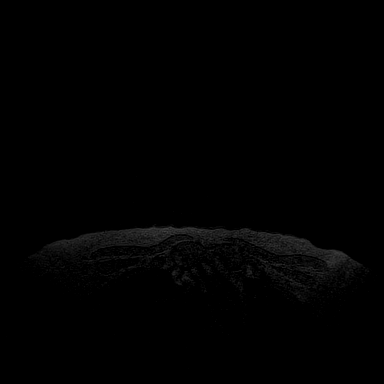

[Series 4: fl3d pre-cm · axial · non-contrast · 1.2mm · 0.89mm/px · z∈[-74,+97]mm · 5 of 144 slices shown]
[im 1/144]
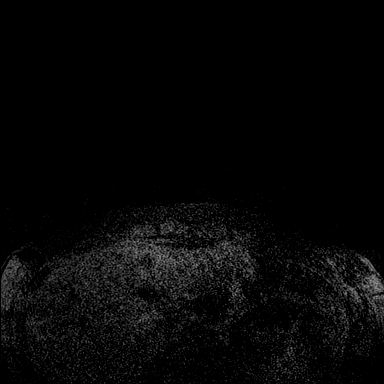
[im 36/144]
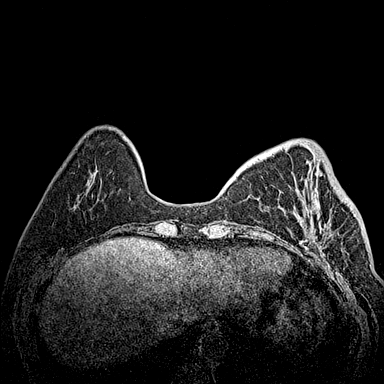
[im 72/144]
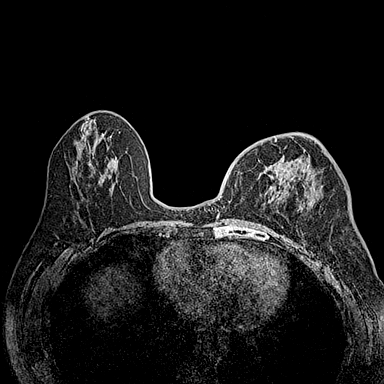
[im 108/144]
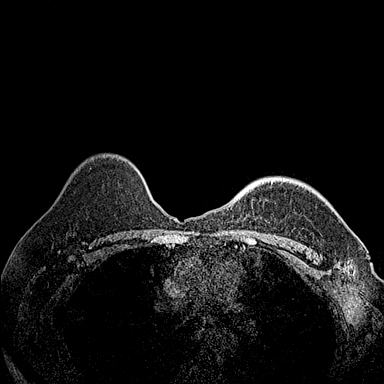
[im 144/144]
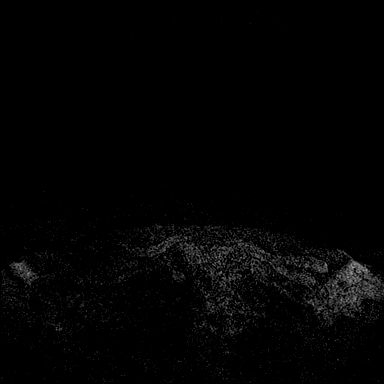

[Series 5: fl3d post-cm 20 · axial · 1.2mm · 0.89mm/px · z∈[-74,+97]mm · 5 of 144 slices shown (1 of 3)]
[im 1/144]
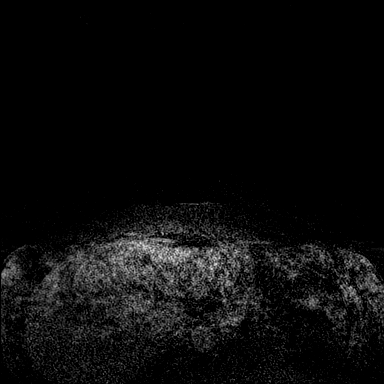
[im 36/144]
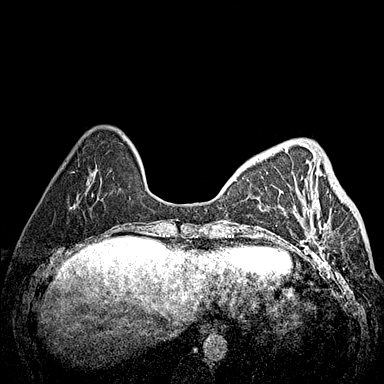
[im 72/144]
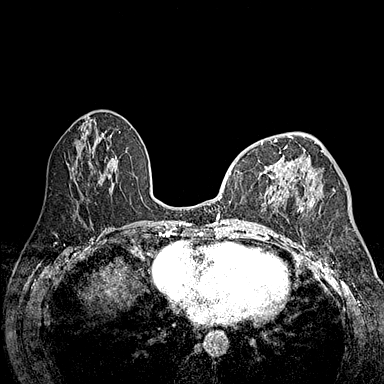
[im 108/144]
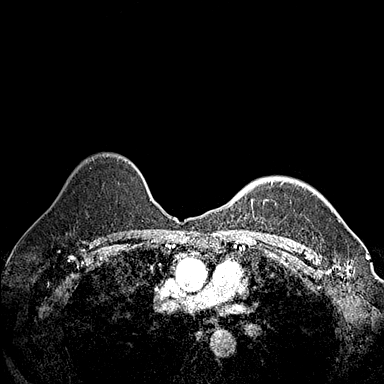
[im 144/144]
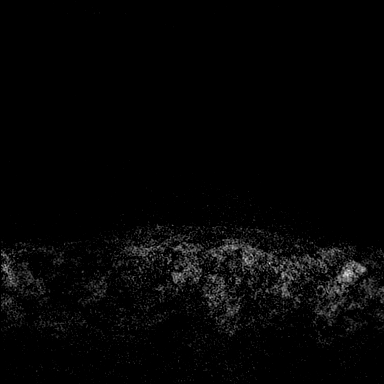

[Series 6: fl3d post-cm 20 · axial · 1.2mm · 0.89mm/px · z∈[-74,+97]mm · 5 of 144 slices shown (2 of 3)]
[im 1/144]
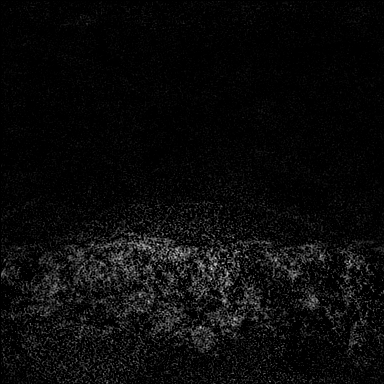
[im 36/144]
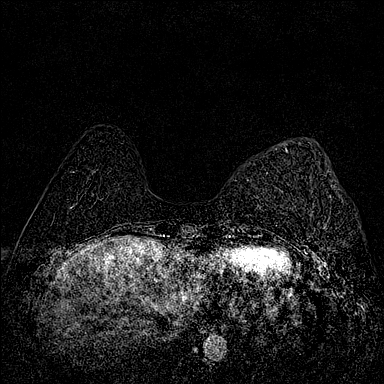
[im 72/144]
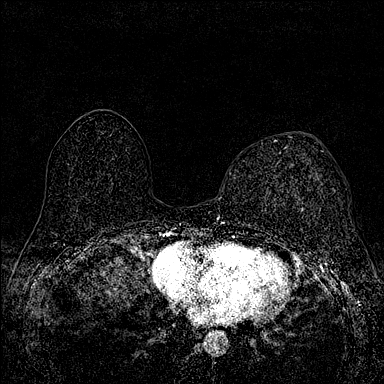
[im 108/144]
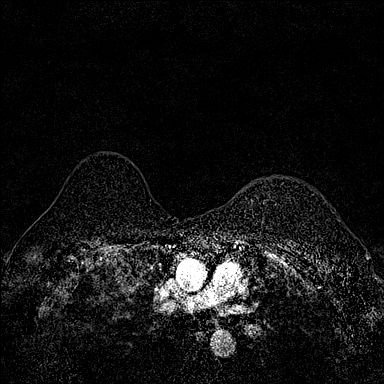
[im 144/144]
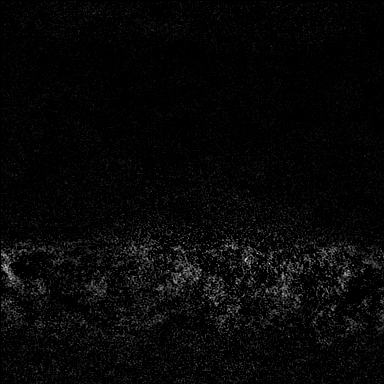

[Series 7: fl3d post-cm 20 · axial · 172.8mm · 0.89mm/px · 1 of 1 slices shown (3 of 3)]
[im 1/1]
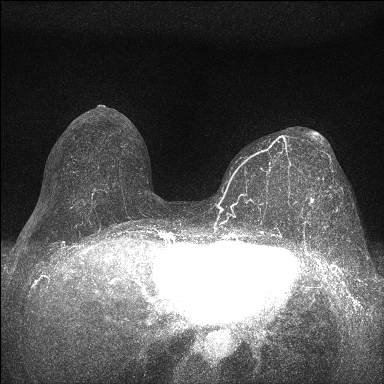

[Series 8: fl3d post-cm 3 · axial · 1.2mm · 0.89mm/px · z∈[-74,+97]mm · 6 of 144 slices shown (1 of 2)]
[im 1/144]
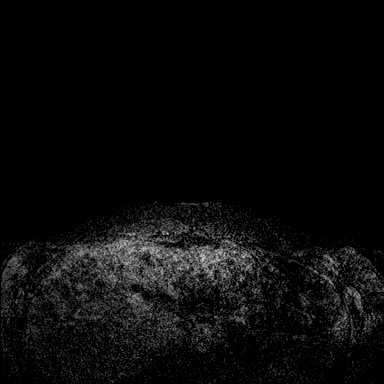
[im 29/144]
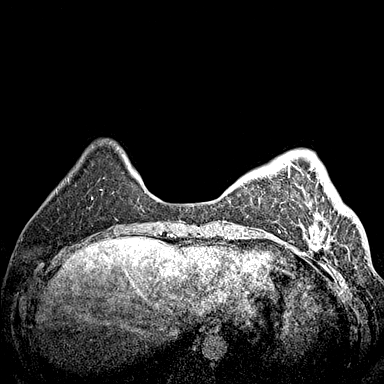
[im 58/144]
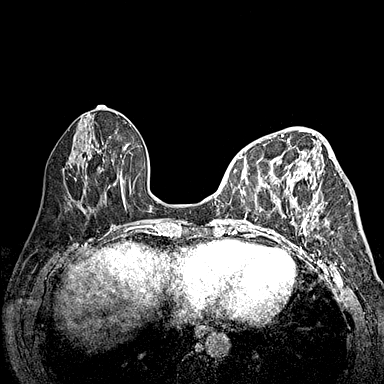
[im 86/144]
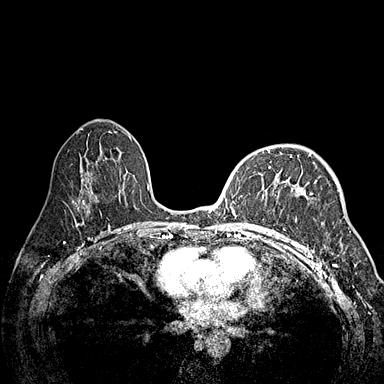
[im 115/144]
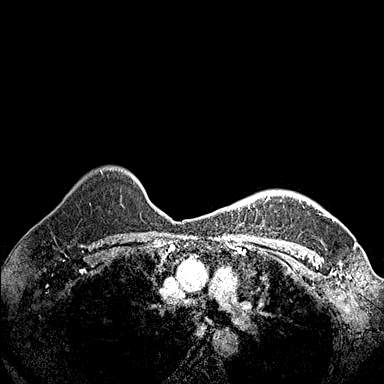
[im 144/144]
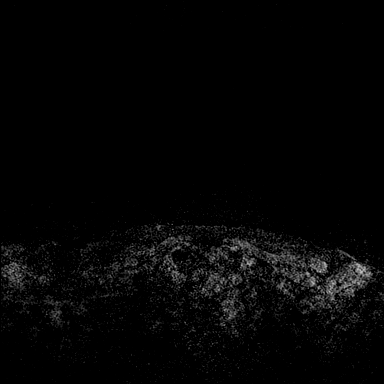

[Series 9: fl3d post-cm 3 · axial · 1.2mm · 0.89mm/px · z∈[-74,+28]mm · 4 of 144 slices shown (2 of 2)]
[im 1/144]
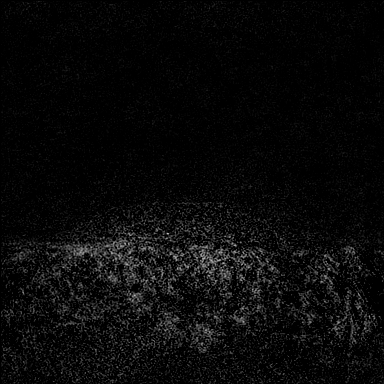
[im 29/144]
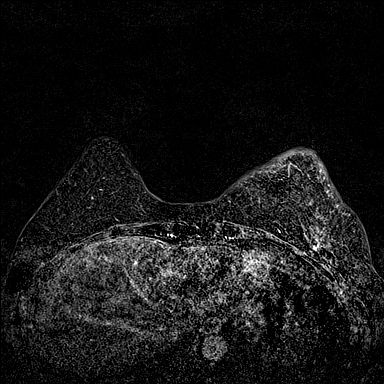
[im 58/144]
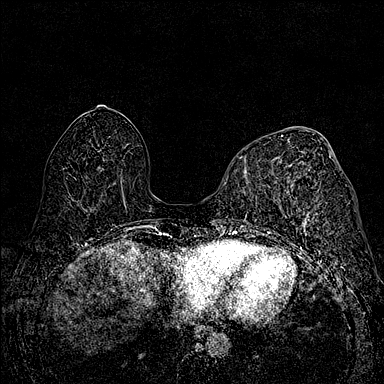
[im 86/144]
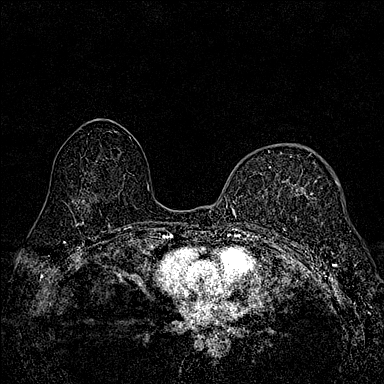

[32 of 48 positions shown; findings below may reference images not displayed]

Three-dimensional MR images were rendered by post-processing of the
original MR data on an independent workstation. The
three-dimensional MR images were interpreted, and findings are
reported in the following complete MRI report for this study. Three
dimensional images were evaluated at the independent interpreting
workstation using the DynaCAD thin client.
FINDINGS: Breast composition: c. Heterogeneous fibroglandular tissue.

Background parenchymal enhancement: Minimal

Right breast: No mass or abnormal enhancement.

Left breast: Post lumpectomy changes are identified in the posterior
LOWER OUTER QUADRANT of the LEFT breast. No suspicious enhancement
in this region. Just anterior to the lumpectomy site, there is
tissue marker clip artifact from previous MR guided core biopsy
showing benign concordant changes. There is minimal residual
enhancement in this region. In the UPPER anterior portion of the
LEFT breast, a tissue marker clip marks the site of previous benign
concordant biopsy. No new areas of enhancement identified. There is
skin and trabecular thickening of the LEFT breast following interval
radiation treatment.

Lymph nodes: No abnormal appearing lymph nodes. Post biopsy changes
seen in the LEFT axilla.

Ancillary findings:  None.
IMPRESSION: 1. Expected post lumpectomy and post radiation changes in the LEFT
breast.
2. Tissue marker clips at the site of previous benign concordant
biopsies, without suspicious residual enhancement.

RECOMMENDATION:
Recommend bilateral diagnostic mammogram in [DATE].

BI-RADS CATEGORY  2: Benign.

## 2021-04-27 MED ORDER — GADOBUTROL 1 MMOL/ML IV SOLN
7.0000 mL | Freq: Once | INTRAVENOUS | Status: AC | PRN
  Administered 2021-04-27: 7 mL via INTRAVENOUS

## 2021-05-03 ENCOUNTER — Other Ambulatory Visit: Payer: Self-pay

## 2021-05-03 ENCOUNTER — Ambulatory Visit: Payer: BC Managed Care – PPO | Attending: General Surgery | Admitting: Physical Therapy

## 2021-05-03 ENCOUNTER — Encounter: Payer: Self-pay | Admitting: Physical Therapy

## 2021-05-03 DIAGNOSIS — C50512 Malignant neoplasm of lower-outer quadrant of left female breast: Secondary | ICD-10-CM | POA: Diagnosis present

## 2021-05-03 DIAGNOSIS — R600 Localized edema: Secondary | ICD-10-CM | POA: Insufficient documentation

## 2021-05-03 DIAGNOSIS — Z170 Estrogen receptor positive status [ER+]: Secondary | ICD-10-CM | POA: Insufficient documentation

## 2021-05-03 DIAGNOSIS — R293 Abnormal posture: Secondary | ICD-10-CM | POA: Diagnosis present

## 2021-05-03 DIAGNOSIS — Z483 Aftercare following surgery for neoplasm: Secondary | ICD-10-CM | POA: Diagnosis present

## 2021-05-03 NOTE — Therapy (Signed)
[facility] @ [place], [place], [address] Phone: [phone]   Fax:  [phone]  Physical Therapy Treatment  Patient Details  Name: Amy Stephenson MRN: [id number] Date of Birth: [birth year] Referring Provider (PT): Dr. Isidore Moos   Encounter Date: 05/03/2021   PT End of Session - 05/03/21 1457     Visit Number 4    Number of Visits 13    Date for PT Re-Evaluation 05/27/21    PT Start Time 1404    PT Stop Time 9450    PT Time Calculation (min) 50 min    Activity Tolerance Patient tolerated treatment well    Behavior During Therapy [facility] for tasks assessed/performed             Past Medical History:  Diagnosis Date   Diabetes mellitus without complication ([place])    Family history of breast cancer    Family history of colon cancer    Family history of prostate cancer    Family history of stomach cancer    Personal history of radiation therapy     Past Surgical History:  Procedure Laterality Date   BREAST LUMPECTOMY     BREAST LUMPECTOMY WITH RADIOACTIVE SEED AND SENTINEL LYMPH NODE BIOPSY Left 10/15/2020   Procedure: LEFT BREAST LUMPECTOMY WITH RADIOACTIVE SEED AND LEFT AXILLARY SENTINEL LYMPH NODE BIOPSY;  Surgeon: Rolm Bookbinder, MD;  Location: [facility];  Service: General;  Laterality: Left;  90 MINUTES ROOM 8   CHOLECYSTECTOMY     COLONOSCOPY      There were no vitals filed for this visit.   Subjective Assessment - 05/03/21 1404     Subjective My doctor added dry needling to begin in a week. I have been doing the massage consistently.    Pertinent History left breast cancer found on mammogram 08/2020   She underwent Left Lumpectomy with SLNB on 10/15/2020 and was diagnosed with Gr. 2 IDC ER+, PR+, Her 2 - with Ki67 5%.She  had radiation from 11/17/2020 - 12/15/2020.  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  presently being treated for sciatic pain and nerve pain in arm.  She has a small left shoulder RTC tear    Patient Stated Goals Be able to feel comfortable that I am doing the right things, decrease pain, get rid of strands    Currently in Pain? Yes    Pain Score 5     Pain Location Axilla    Pain Orientation Left    Pain Descriptors / Indicators Sore;Tender    Pain Type Acute pain    Pain Onset 1 to 4 weeks ago    Pain Frequency Intermittent    Aggravating Factors  laying on left side    Pain Relieving Factors sport bra    Effect of Pain on Daily Activities if she does too much it makes the area hurt worse                               [facility] Adult PT Treatment/Exercise - 05/03/21 0001       Manual Therapy   Soft tissue mobilization soft tissue work to left pectorals/Lats in supine and in SL to lats and scapular area. scar mobilization to inferior  brest incision    Manual Lymphatic Drainage (MLD) in supine. to left breast:  short neck, right axillary LN's, anterior interaxillary pathway, left inginal nodes and establishment of L axillo-inguinal pathway and left breast directing to pathways and retracing all steps and ending with LN's                          PT Long Term Goals - 04/15/21 1255       PT LONG TERM GOAL #1   Title Pt will be independent in Self left breast MLD    Time 6    Period Weeks    Target Date 05/27/21      PT LONG TERM GOAL #2   Title Pt will be fit for compression bra and will note decreased left breast discomfort while wearing    Time 6    Period Weeks    Status New    Target Date 05/27/21      PT LONG TERM GOAL #3   Title Pt will have decreased left upper quarter discomfort by atleast 50%    Time 6    Period Weeks    Status New    Target Date 05/27/21                   Plan - 05/03/21 1458     Clinical Impression Statement Continued  with MLD to L breast today with extra time spent in area of fibrosis. Pt still has trigger points in her lats and L pec so used cocoa butter with soft tissue mobilization to these areas to decrease tightness and discomfort. At end of session pt reported improvements. She reports she has been consistent with self MLD.    PT Frequency 2x / week    PT Duration 6 weeks    PT Treatment/Interventions ADLs/Self Care Home Management;Therapeutic exercise;Therapeutic activities;Patient/family education    PT Next Visit Plan STM pecs/lats/scap area Traps, left breast MLD and review with pt again, AROM flex,scap, HA, scar mobs,supine scap series    PT Home Exercise Plan left breast MLD    Consulted and Agree with Plan of Care Patient             Patient will benefit from skilled therapeutic intervention in order to improve the following deficits and impairments:  Increased edema, Postural dysfunction, Pain, Decreased knowledge of precautions, Decreased activity tolerance, Impaired UE functional use, Increased fascial restricitons  Visit Diagnosis: Localized edema  Abnormal posture  Aftercare following surgery for neoplasm  Malignant neoplasm of lower-outer quadrant of left breast of female, estrogen receptor positive ([place])     Problem List Patient Active Problem List   Diagnosis Date Noted   Genetic testing 10/04/2020   Family history of breast cancer    Family history of colon cancer    Family history of prostate cancer    Family history of stomach cancer    Malignant neoplasm of lower-outer quadrant of left breast of female, estrogen receptor positive ([place]) 09/22/2020   Anxiety 09/07/2015    Manus Gunning, PT 05/03/2021, 3:01 PM  [facility] @ [place], [place], [address] Phone: [phone]   Fax:  [phone]  Name: Amy Stephenson MRN: [id number] Date of Birth: [birth year]   Manus Gunning,  PT 05/03/21 3:01 PM

## 2021-05-04 ENCOUNTER — Encounter: Payer: Self-pay | Admitting: Physical Therapy

## 2021-05-04 ENCOUNTER — Ambulatory Visit: Payer: BC Managed Care – PPO | Admitting: Physical Therapy

## 2021-05-04 DIAGNOSIS — R293 Abnormal posture: Secondary | ICD-10-CM

## 2021-05-04 DIAGNOSIS — Z483 Aftercare following surgery for neoplasm: Secondary | ICD-10-CM

## 2021-05-04 DIAGNOSIS — Z170 Estrogen receptor positive status [ER+]: Secondary | ICD-10-CM

## 2021-05-04 DIAGNOSIS — R600 Localized edema: Secondary | ICD-10-CM

## 2021-05-04 DIAGNOSIS — C50512 Malignant neoplasm of lower-outer quadrant of left female breast: Secondary | ICD-10-CM

## 2021-05-04 NOTE — Therapy (Signed)
[facility] @ [place], [place], [address] Phone: [phone]   Fax:  [phone]  Physical Therapy Treatment  Patient Details  Name: Amy Stephenson MRN: [id number] Date of Birth: [birth year] Referring Provider (PT): Dr. Isidore Moos   Encounter Date: 05/04/2021   PT End of Session - 05/04/21 1157     Visit Number 5    Number of Visits 13    Date for PT Re-Evaluation 05/27/21    PT Start Time 1108    PT Stop Time 1497    PT Time Calculation (min) 48 min    Activity Tolerance Patient tolerated treatment well    Behavior During Therapy [facility] for tasks assessed/performed             Past Medical History:  Diagnosis Date   Diabetes mellitus without complication ([place])    Family history of breast cancer    Family history of colon cancer    Family history of prostate cancer    Family history of stomach cancer    Personal history of radiation therapy     Past Surgical History:  Procedure Laterality Date   BREAST LUMPECTOMY     BREAST LUMPECTOMY WITH RADIOACTIVE SEED AND SENTINEL LYMPH NODE BIOPSY Left 10/15/2020   Procedure: LEFT BREAST LUMPECTOMY WITH RADIOACTIVE SEED AND LEFT AXILLARY SENTINEL LYMPH NODE BIOPSY;  Surgeon: Rolm Bookbinder, MD;  Location: [facility];  Service: General;  Laterality: Left;  90 MINUTES ROOM 8   CHOLECYSTECTOMY     COLONOSCOPY      There were no vitals filed for this visit.   Subjective Assessment - 05/04/21 1109     Subjective I am doing pretty good. My husband massaged back where you massaged. The pain is still there.    Pertinent History left breast cancer found on mammogram 08/2020   She underwent Left Lumpectomy with SLNB on 10/15/2020 and was diagnosed with Gr. 2 IDC ER+, PR+, Her 2 - with Ki67 5%.She  had radiation from 11/17/2020 - 12/15/2020.  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  presently being treated for sciatic pain and nerve pain in arm.  She has a small left shoulder RTC tear    Patient Stated Goals Be able to feel comfortable that I am doing the right things, decrease pain, get rid of strands    Currently in Pain? Yes    Pain Score 4     Pain Location Axilla    Pain Orientation Left    Pain Descriptors / Indicators Sore;Tender    Pain Type Acute pain    Pain Onset 1 to 4 weeks ago    Pain Frequency Intermittent    Aggravating Factors  laying on left side    Pain Relieving Factors sports bra    Effect of Pain on Daily Activities if she does too much it makes the area hurt worse                               [facility] Adult PT Treatment/Exercise - 05/04/21 0001       Manual Therapy   Soft tissue mobilization soft tissue work to left Lats in supine and in SL to lats and scapular area. scar mobilization to inferior  brest incision    Manual Lymphatic Drainage (MLD) in supine. to left breast:  short neck, right axillary LN's, anterior interaxillary pathway, left inginal nodes and establishment of L axillo-inguinal pathway and left breast directing to pathways and retracing all steps and ending with LN's                          PT Long Term Goals - 04/15/21 1255       PT LONG TERM GOAL #1   Title Pt will be independent in Self left breast MLD    Time 6    Period Weeks    Target Date 05/27/21      PT LONG TERM GOAL #2   Title Pt will be fit for compression bra and will note decreased left breast discomfort while wearing    Time 6    Period Weeks    Status New    Target Date 05/27/21      PT LONG TERM GOAL #3   Title Pt will have decreased left upper quarter discomfort by atleast 50%    Time 6    Period Weeks    Status New    Target Date 05/27/21                   Plan - 05/04/21 1158     Clinical Impression Statement Continued with MLD  to L breast today. Spent increased time working on areas of fibrosis in L lats. There were several trigger points noted in this area. These improved by end of session. Pt still has fibrosis surrounding L lumpectomy scar so spent time in this area as well. Pt reports her pain is improved when she leaves therapy but gradually returns throughout the day.    PT Frequency 2x / week    PT Duration 6 weeks    PT Treatment/Interventions ADLs/Self Care Home Management;Therapeutic exercise;Therapeutic activities;Patient/family education    PT Next Visit Plan STM pecs/lats/scap area Traps, left breast MLD and review with pt again, AROM flex,scap, HA, scar mobs,supine scap series    PT Home Exercise Plan left breast MLD    Consulted and Agree with Plan of Care Patient             Patient will benefit from skilled therapeutic intervention in order to improve the following deficits and impairments:  Increased edema, Postural dysfunction, Pain, Decreased knowledge of precautions, Decreased activity tolerance, Impaired UE functional use, Increased fascial restricitons  Visit Diagnosis: Localized edema  Abnormal posture  Aftercare following surgery for neoplasm  Malignant neoplasm of lower-outer quadrant of left breast of female, estrogen receptor positive ([place])     Problem List Patient Active Problem List   Diagnosis Date Noted   Genetic testing 10/04/2020   Family history of breast cancer    Family history of colon cancer    Family history of prostate cancer    Family history of stomach cancer    Malignant neoplasm of lower-outer quadrant of left breast of female, estrogen receptor positive ([place]) 09/22/2020   Anxiety 09/07/2015    Manus Gunning, PT 05/04/2021, 12:00 PM  [facility] @ [place], [place], [address] Phone: [phone]   Fax:  [phone]  Name: Amy Stephenson MRN: [id number] Date of Birth:  [birth year]   Manus Gunning, PT 05/04/21 12:00 PM

## 2021-05-06 ENCOUNTER — Telehealth: Payer: Self-pay | Admitting: *Deleted

## 2021-05-09 ENCOUNTER — Other Ambulatory Visit: Payer: Self-pay | Admitting: *Deleted

## 2021-05-09 DIAGNOSIS — M25552 Pain in left hip: Secondary | ICD-10-CM

## 2021-05-09 DIAGNOSIS — Z170 Estrogen receptor positive status [ER+]: Secondary | ICD-10-CM

## 2021-05-09 DIAGNOSIS — C50512 Malignant neoplasm of lower-outer quadrant of left female breast: Secondary | ICD-10-CM

## 2021-05-09 NOTE — Progress Notes (Signed)
Received call from pt with complaint of severe left hip pain x3 months.  Per MD pt needing Xray of bilateral hips today and a bone scan ordered.  Orders placed, pt notified that appt is not needed for xray and RN will alert pt once bone scan is scheduled.  MD also request that referral be placed to [facility] for evaluation and tx if xray is negative.

## 2021-05-10 ENCOUNTER — Ambulatory Visit (HOSPITAL_COMMUNITY)
Admission: RE | Admit: 2021-05-10 | Discharge: 2021-05-10 | Disposition: A | Discharge status: Home / Self Care | Payer: BC Managed Care – PPO | Source: Ambulatory Visit | Attending: Hematology and Oncology | Admitting: Hematology and Oncology

## 2021-05-10 ENCOUNTER — Other Ambulatory Visit: Payer: Self-pay

## 2021-05-10 ENCOUNTER — Ambulatory Visit: Payer: BC Managed Care – PPO

## 2021-05-10 DIAGNOSIS — M25552 Pain in left hip: Secondary | ICD-10-CM | POA: Diagnosis present

## 2021-05-10 DIAGNOSIS — Z170 Estrogen receptor positive status [ER+]: Secondary | ICD-10-CM | POA: Diagnosis present

## 2021-05-10 DIAGNOSIS — C50512 Malignant neoplasm of lower-outer quadrant of left female breast: Secondary | ICD-10-CM | POA: Diagnosis present

## 2021-05-10 DIAGNOSIS — R293 Abnormal posture: Secondary | ICD-10-CM

## 2021-05-10 DIAGNOSIS — R600 Localized edema: Secondary | ICD-10-CM

## 2021-05-10 DIAGNOSIS — Z483 Aftercare following surgery for neoplasm: Secondary | ICD-10-CM

## 2021-05-10 IMAGING — DX DG HIP (WITH OR WITHOUT PELVIS) 3-4V BILAT
5 series · 5 of 5 positions shown · non-contrast
Comparison: None.

CLINICAL DATA: Recent diagnosis of breast cancer with pain
throughout the pelvis and both hips, left greater than right.

EXAM:
DG HIP (WITH OR WITHOUT PELVIS) 3-4V BILAT

[pelvis ap]
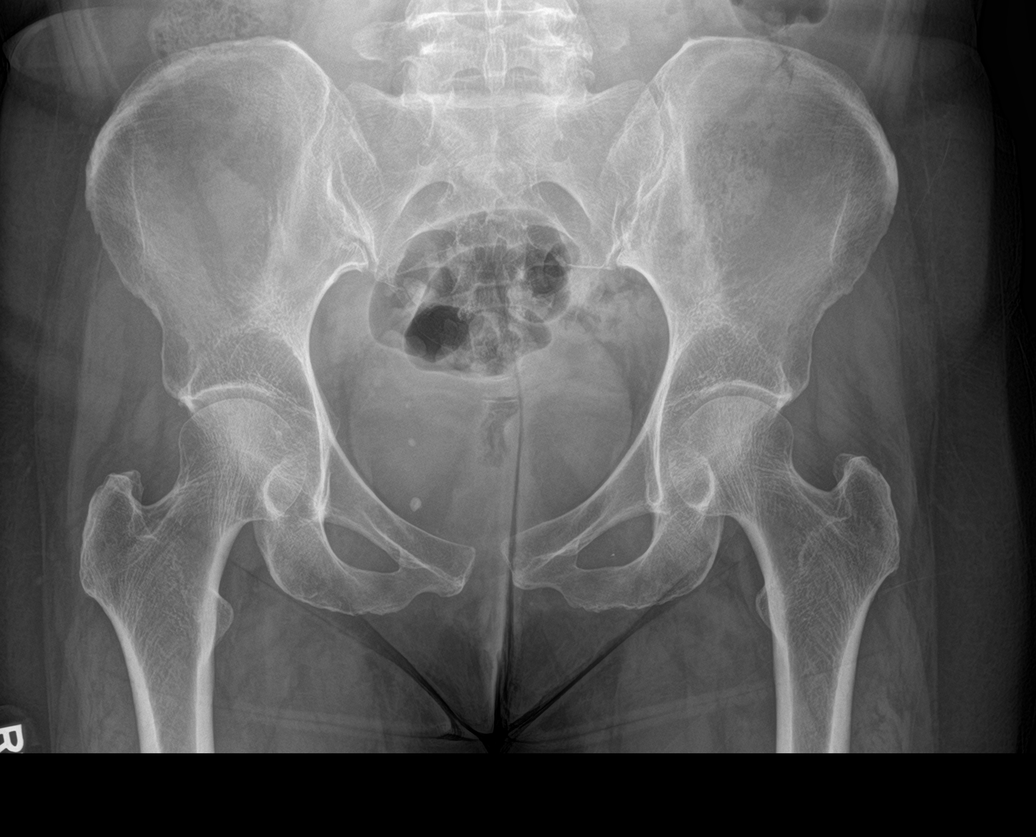

[hip ap (1 of 2)]
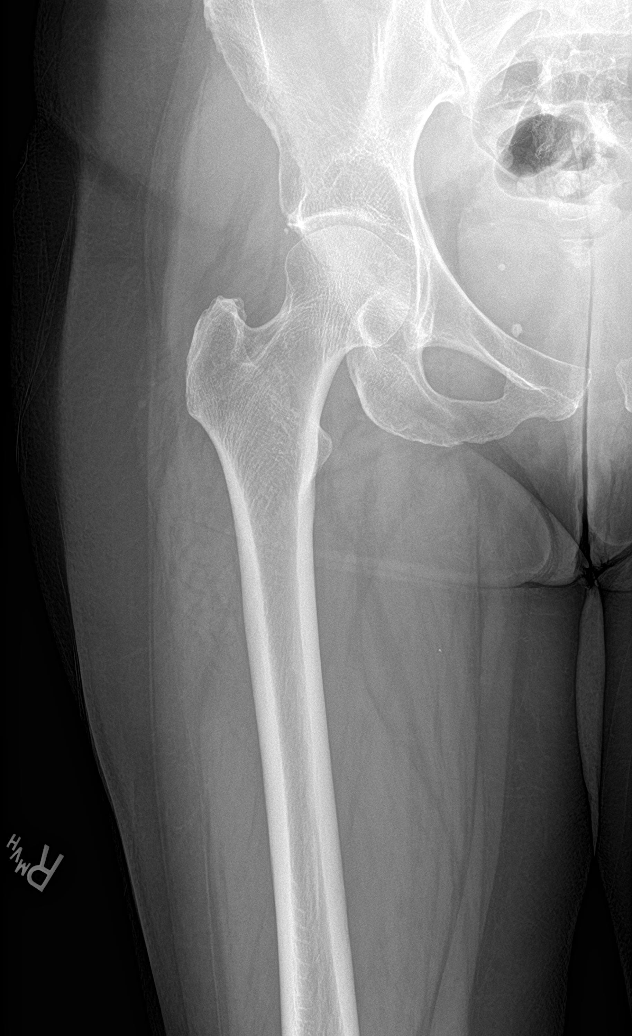

[hip lat (1 of 2)]
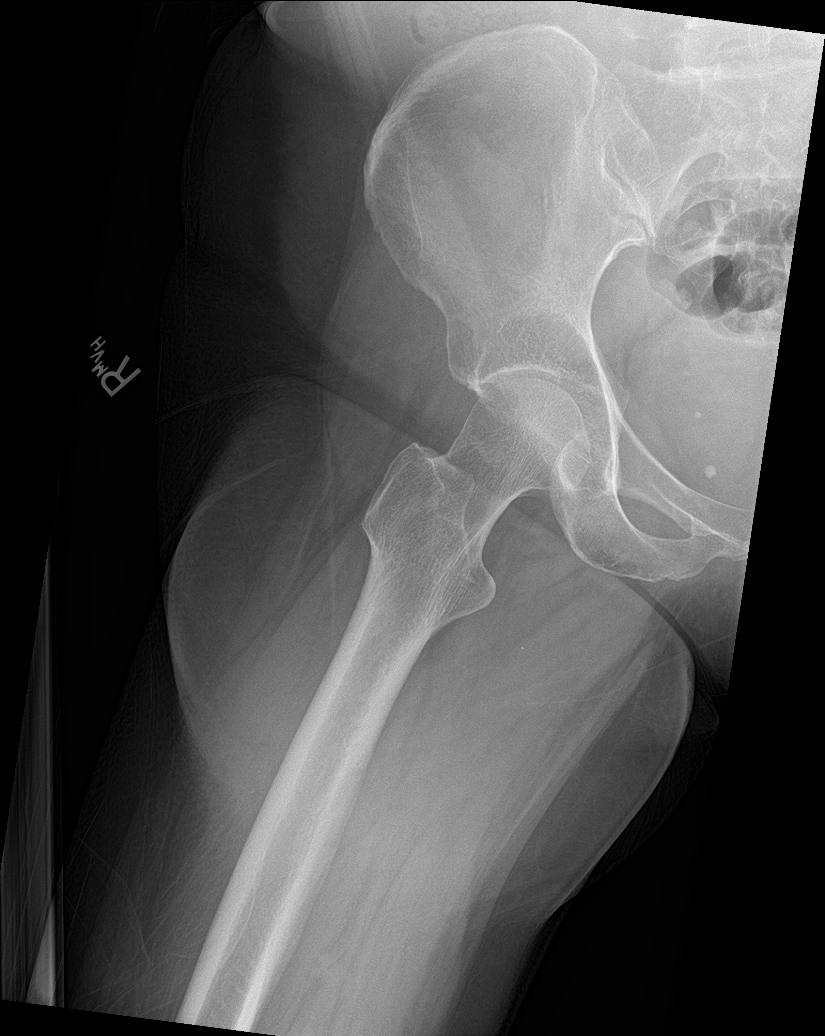

[hip ap (2 of 2)]
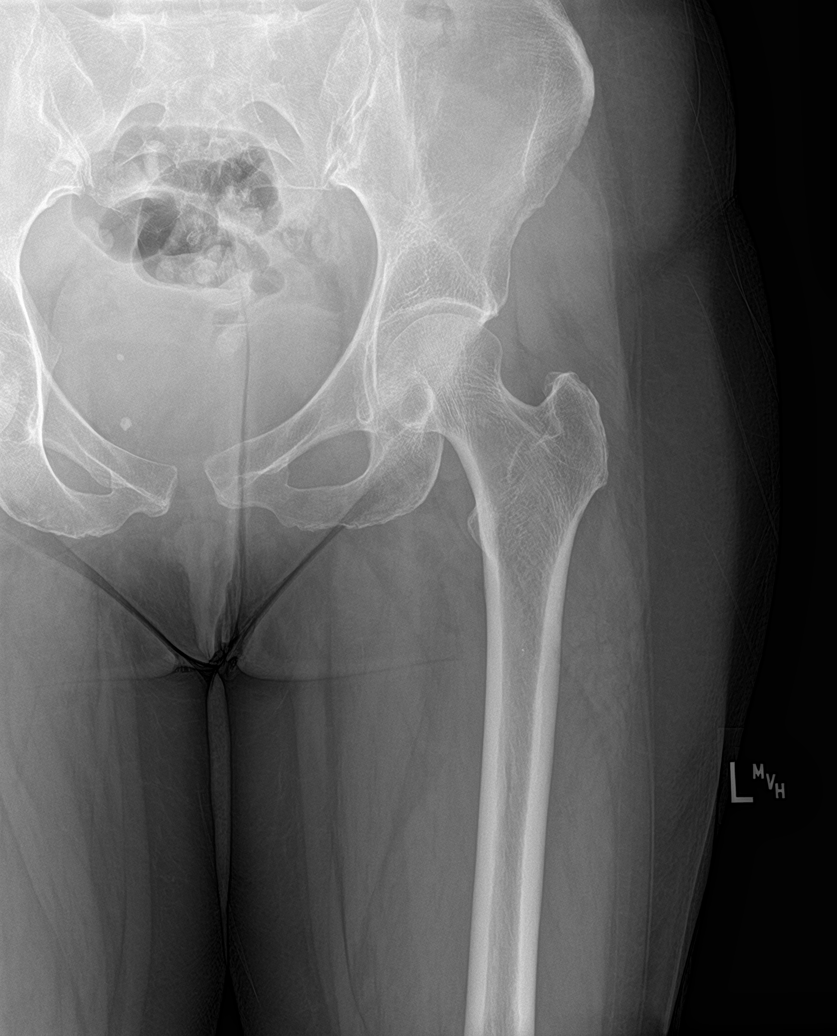

[hip lat (2 of 2)]
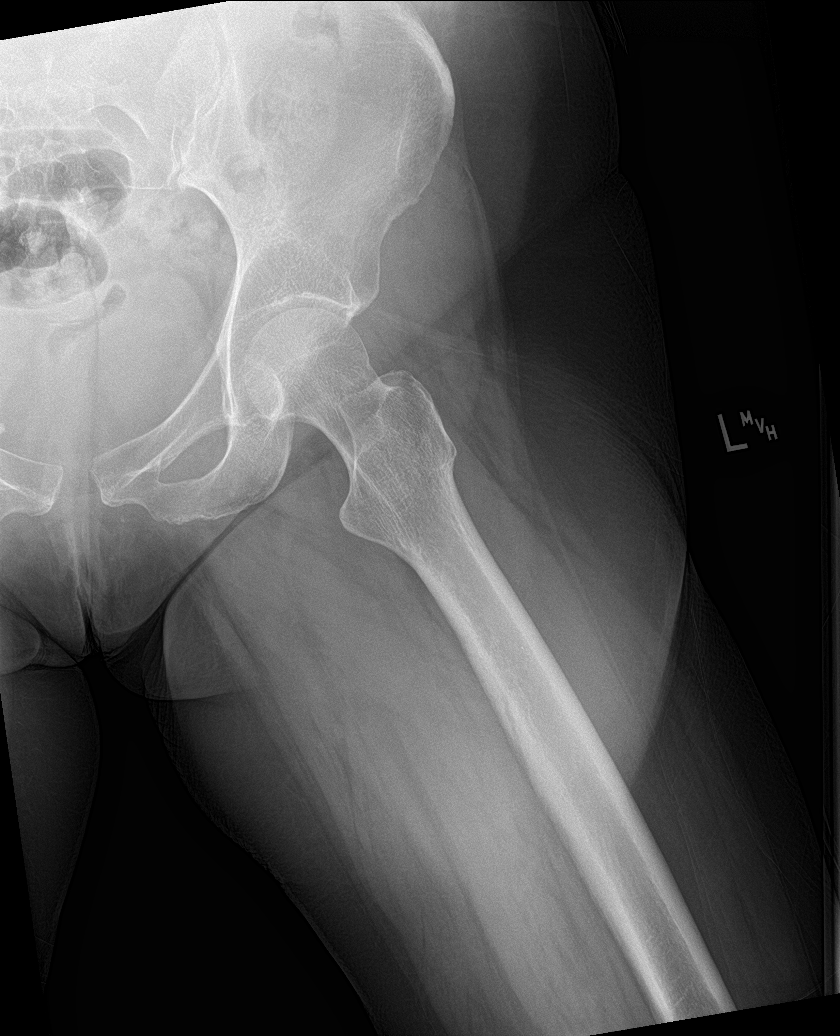

[5 of 5 positions shown; findings below may reference images not displayed]

FINDINGS: There is no evidence of hip fracture or dislocation. There is no
evidence of arthropathy or other focal bone abnormality.
IMPRESSION: Negative.

## 2021-05-10 NOTE — Therapy (Signed)
[facility] @ [place], [place], [address] Phone: [phone]   Fax:  [phone]  Physical Therapy Treatment  Patient Details  Name: Amy Stephenson MRN: [id number] Date of Birth: 1965/12/30 Referring Provider (PT): Dr. Isidore Moos   Encounter Date: 05/10/2021   PT End of Session - 05/10/21 1208     Visit Number 6    Number of Visits 13    Date for PT Re-Evaluation 05/27/21    PT Start Time 1106    PT Stop Time 1205    PT Time Calculation (min) 59 min    Activity Tolerance Patient tolerated treatment well    Behavior During Therapy [facility] for tasks assessed/performed             Past Medical History:  Diagnosis Date   Diabetes mellitus without complication ([place])    Family history of breast cancer    Family history of colon cancer    Family history of prostate cancer    Family history of stomach cancer    Personal history of radiation therapy     Past Surgical History:  Procedure Laterality Date   BREAST LUMPECTOMY     BREAST LUMPECTOMY WITH RADIOACTIVE SEED AND SENTINEL LYMPH NODE BIOPSY Left 10/15/2020   Procedure: LEFT BREAST LUMPECTOMY WITH RADIOACTIVE SEED AND LEFT AXILLARY SENTINEL LYMPH NODE BIOPSY;  Surgeon: Rolm Bookbinder, MD;  Location: [facility];  Service: General;  Laterality: Left;  90 MINUTES ROOM 8   CHOLECYSTECTOMY     COLONOSCOPY      There were no vitals filed for this visit.   Subjective Assessment - 05/10/21 1110     Subjective I can tell I'm improving but my breast definitely feels better when I leave physical therapy. I start dry needling next Monday with [employer] for my lats and the rib tightness I've had.    Pertinent History left breast cancer found on mammogram 08/2020   She underwent Left Lumpectomy with SLNB on 10/15/2020 and was diagnosed with Gr. 2 IDC ER+, PR+, Her 2 - with Ki67 5%.She  had radiation from 11/17/2020 - 12/15/2020.  She has a  past history of  frozen shoulder in both shoulders ( 2016 on right, 2018 on left) she feels that she is finally getting  better for that, Past history also include DM, anxiety.  presently being treated for sciatic pain and nerve pain in arm.  She has a small left shoulder RTC tear    Patient Stated Goals Be able to feel comfortable that I am doing the right things, decrease pain, get rid of strands    Currently in Pain? Yes    Pain Score 4     Pain Location Rib cage    Pain Orientation Left    Pain Descriptors / Indicators Aching;Dull    Pain Type Acute pain    Pain Radiating Towards axilla    Pain Onset 1 to 4 weeks ago    Pain Frequency Intermittent    Aggravating Factors  rain weather today    Pain Relieving Factors compression bra is helping                               [facility] Adult PT Treatment/Exercise - 05/10/21 0001       Manual Therapy   Manual Therapy Scapular mobilization;Soft tissue mobilization;Manual Lymphatic Drainage (MLD)    Soft tissue mobilization soft tissue work  to left Lats in supine and in Rt S/L to lats and scapular area; also to inferior breast incision    Scapular Mobilization In Rt S/L to Lt scapula into protraction and retraction    Manual Lymphatic Drainage (MLD) in supine. to left breast:  short neck, right axillary LN's, anterior interaxillary pathway, left inginal nodes and establishment of L axillo-inguinal pathway and left breast, then into Rt S/L for further work along lateral breast and trunk and instructed pt in this position, then finished retracing all steps in supine directing to pathways and retracing all steps and ending with LN's                          PT Long Term Goals - 04/15/21 1255       PT LONG TERM GOAL #1   Title Pt will be independent in Self left breast MLD    Time 6    Period Weeks    Target Date 05/27/21      PT LONG TERM GOAL #2   Title Pt will be fit for compression bra and will note decreased left  breast discomfort while wearing    Time 6    Period Weeks    Status New    Target Date 05/27/21      PT LONG TERM GOAL #3   Title Pt will have decreased left upper quarter discomfort by atleast 50%    Time 6    Period Weeks    Status New    Target Date 05/27/21                   Plan - 05/10/21 1209     Clinical Impression Statement Continued with MLD to Lt breast and instructed pt how to better reach the lateral aspect in Rt S/L. She was able to return good demo of this. Also continued with STM to Lt lats with much less trigger points palpable today. Pt begins dry needling on Monday.    Personal Factors and Comorbidities Comorbidity 2    Comorbidities left breast cancer, DM    Examination-Activity Limitations Lift;Reach Overhead    Stability/Clinical Decision Making Stable/Uncomplicated    Rehab Potential Excellent    PT Frequency 2x / week    PT Duration 6 weeks    PT Treatment/Interventions ADLs/Self Care Home Management;Therapeutic exercise;Therapeutic activities;Patient/family education    PT Next Visit Plan STM pecs/lats/scap area Traps, left breast MLD and review with pt again, AROM flex,scap, HA, scar mobs,supine scap series    PT Home Exercise Plan left breast MLD    Consulted and Agree with Plan of Care Patient             Patient will benefit from skilled therapeutic intervention in order to improve the following deficits and impairments:  Increased edema, Postural dysfunction, Pain, Decreased knowledge of precautions, Decreased activity tolerance, Impaired UE functional use, Increased fascial restricitons  Visit Diagnosis: Localized edema  Abnormal posture  Aftercare following surgery for neoplasm  Malignant neoplasm of lower-outer quadrant of left breast of female, estrogen receptor positive ([place])     Problem List Patient Active Problem List   Diagnosis Date Noted   Genetic testing 10/04/2020   Family history of breast cancer    Family  history of colon cancer    Family history of prostate cancer    Family history of stomach cancer    Malignant neoplasm of lower-outer quadrant of left breast of female,  estrogen receptor positive ([place]) 09/22/2020   Anxiety 09/07/2015    Otelia Limes, PTA 05/10/2021, 12:11 PM  [facility] @ [place], [place], [address] Phone: [phone]   Fax:  [phone]  Name: Amy Stephenson MRN: [id number] Date of Birth: 07/17/1965

## 2021-05-11 ENCOUNTER — Ambulatory Visit: Payer: BC Managed Care – PPO

## 2021-05-11 ENCOUNTER — Encounter: Payer: Self-pay | Admitting: *Deleted

## 2021-05-11 DIAGNOSIS — Z170 Estrogen receptor positive status [ER+]: Secondary | ICD-10-CM

## 2021-05-11 DIAGNOSIS — Z483 Aftercare following surgery for neoplasm: Secondary | ICD-10-CM

## 2021-05-11 DIAGNOSIS — R600 Localized edema: Secondary | ICD-10-CM | POA: Diagnosis not present

## 2021-05-11 DIAGNOSIS — R293 Abnormal posture: Secondary | ICD-10-CM

## 2021-05-11 DIAGNOSIS — C50512 Malignant neoplasm of lower-outer quadrant of left female breast: Secondary | ICD-10-CM

## 2021-05-11 NOTE — Progress Notes (Signed)
Per MD request RN successfully faxed referral to [facility] for evaluation and tx of left hip pain.

## 2021-05-11 NOTE — Patient Instructions (Signed)
Over Head Pull: Narrow and Wide Grip   Cancer Rehab [phone]   On back, knees bent, feet flat, band across thighs, elbows straight but relaxed. Pull hands apart (start). Keeping elbows straight, bring arms up and over head, hands toward floor. Keep pull steady on band. Hold momentarily. Return slowly, keeping pull steady, back to start. Then do same with a wider grip on the band (past shoulder width) Repeat _5-10__ times. Band color __yellow____   Side Pull: Double Arm   On back, knees bent, feet flat. Arms perpendicular to body, shoulder level, elbows straight but relaxed. Pull arms out to sides, elbows straight. Resistance band comes across collarbones, hands toward floor. Hold momentarily. Slowly return to starting position. Repeat _5-10__ times. Band color _yellow____   Sword  ** Keep thumbs up   On back, knees bent, feet flat, left hand on left hip, right hand above left. Pull right arm DIAGONALLY (hip to shoulder) across chest. Bring right arm along head toward floor. Hold momentarily. Slowly return to starting position. Repeat _5-10__ times. Do with left arm. Band color _yellow_____   Shoulder Rotation: Double Arm;   **Go only half way   On back, knees bent, feet flat, elbows tucked at sides, bent 90, hands palms up. Pull hands apart and down toward floor, keeping elbows near sides. Hold momentarily. Slowly return to starting position. Repeat _5-10__ times. Band color __yellow____

## 2021-05-11 NOTE — Therapy (Signed)
[facility] @ [address] [place], [place], [address] Phone: [phone]   Fax:  [phone]  Physical Therapy Treatment  Patient Details  Name: Amy Stephenson MRN: [id number] Date of Birth: 1965/12/09 Referring Provider (PT): Dr. Isidore Moos   Encounter Date: 05/11/2021   PT End of Session - 05/11/21 1112     Visit Number 7    Number of Visits 13    Date for PT Re-Evaluation 05/27/21    PT Start Time 1105    PT Stop Time 1200    PT Time Calculation (min) 55 min    Activity Tolerance Patient tolerated treatment well    Behavior During Therapy [facility] for tasks assessed/performed             Past Medical History:  Diagnosis Date   Diabetes mellitus without complication ([place])    Family history of breast cancer    Family history of colon cancer    Family history of prostate cancer    Family history of stomach cancer    Personal history of radiation therapy     Past Surgical History:  Procedure Laterality Date   BREAST LUMPECTOMY     BREAST LUMPECTOMY WITH RADIOACTIVE SEED AND SENTINEL LYMPH NODE BIOPSY Left 10/15/2020   Procedure: LEFT BREAST LUMPECTOMY WITH RADIOACTIVE SEED AND LEFT AXILLARY SENTINEL LYMPH NODE BIOPSY;  Surgeon: Rolm Bookbinder, MD;  Location: [facility];  Service: General;  Laterality: Left;  90 MINUTES ROOM 8   CHOLECYSTECTOMY     COLONOSCOPY      There were no vitals filed for this visit.   Subjective Assessment - 05/11/21 1105     Subjective I have DN next Monday with Claiborne Billings.  I have been doing DN for my sciatica and it has really helped. My bra rides up sometimes but the foam helps. The swelling goes and comes. some days it is a little puffy and heavy at the lateral trunk. Ribs and lats are still sore and my husband massages it and that helps.    Pertinent History left breast cancer found on mammogram 08/2020   She underwent Left Lumpectomy with SLNB on 10/15/2020 and was diagnosed  with Gr. 2 IDC ER+, PR+, Her 2 - with Ki67 5%.She  had radiation from 11/17/2020 - 12/15/2020.  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  presently being treated for sciatic pain and nerve pain in arm.  She has a small left shoulder RTC tear    Patient Stated Goals Be able to feel comfortable that I am doing the right things, decrease pain, get rid of strands    Currently in Pain? Yes    Pain Score 3     Pain Location Rib cage   lats   Pain Orientation Left    Pain Descriptors / Indicators Aching;Dull    Pain Onset 1 to 4 weeks ago    Pain Frequency Intermittent    Multiple Pain Sites No                               [facility] Adult PT Treatment/Exercise - 05/11/21 0001       Shoulder Exercises: Supine   Horizontal ABduction Strengthening;Both;10 reps    Theraband Level (Shoulder Horizontal ABduction)  Level 1 (Yellow)    External Rotation Strengthening;Both;10 reps    Theraband Level (Shoulder External Rotation) Level 1 (Yellow)    Flexion Strengthening;Both;10 reps    Theraband Level (Shoulder Flexion) Level 1 (Yellow)    Diagonals Strengthening;Right;Left;5 reps    Theraband Level (Shoulder Diagonals) Level 1 (Yellow)    Other Supine Exercises supine AROM Bilateral flexion,scaption, Horizontal abd x 5      Manual Therapy   Soft tissue mobilization soft tissue work to left [place] and Lats in supine and in Rt S/L to lats and scapular area; also to inferior breast incision    Manual Lymphatic Drainage (MLD) in supine. to left breast:  short neck, right axillary LN's, anterior interaxillary pathway, left inginal nodes and establishment of L axillo-inguinal pathway and left breast, then into Rt S/L for further work along lateral breast and trunk then finished retracing all steps in supine directing to pathways and retracing all steps  and ending with LN's                     PT Education - 05/11/21 1211     Education Details Supine scapular series    Person(s) Educated Patient    Methods Explanation;Handout;Demonstration    Comprehension Returned demonstration                 PT Long Term Goals - 04/15/21 1255       PT LONG TERM GOAL #1   Title Pt will be independent in Self left breast MLD    Time 6    Period Weeks    Target Date 05/27/21      PT LONG TERM GOAL #2   Title Pt will be fit for compression bra and will note decreased left breast discomfort while wearing    Time 6    Period Weeks    Status New    Target Date 05/27/21      PT LONG TERM GOAL #3   Title Pt will have decreased left upper quarter discomfort by atleast 50%    Time 6    Period Weeks    Status New    Target Date 05/27/21                   Plan - 05/11/21 1113     Clinical Impression Statement Continued Soft tissue mobilization to left upper quarter with improvements noted throughout and tenderpoints noted primarily over left lats/serratus.  Also performed scar massage to inferior breast incision that continues with restriction.  Educated pt in supine scapular series and issued handout and  yellowTheraband.  Performed MLD to left breast/trunk in supine and SL.  Pt improved overall and did well with instructed exercises.  Minimal edema noted today.  Will see Claiborne Billings on Monday for DN    Personal Factors and Comorbidities Comorbidity 2    Comorbidities left breast cancer, DM    Examination-Activity Limitations Lift;Reach Overhead    Stability/Clinical Decision Making Stable/Uncomplicated    Rehab Potential Excellent    PT Frequency 2x / week    PT Duration 6 weeks    PT Treatment/Interventions ADLs/Self Care Home Management;Therapeutic exercise;Therapeutic activities;Patient/family education    PT Next Visit Plan STM pecs/lats/scap area Traps, left breast MLD and review with pt again, AROM flex,scap, HA,  scar mobs,supine scap series , DN with kelly   PT Home Exercise Plan left breast MLD    Consulted and Agree with Plan of Care Patient  Patient will benefit from skilled therapeutic intervention in order to improve the following deficits and impairments:  Increased edema, Postural dysfunction, Pain, Decreased knowledge of precautions, Decreased activity tolerance, Impaired UE functional use, Increased fascial restricitons  Visit Diagnosis: Localized edema  Abnormal posture  Aftercare following surgery for neoplasm  Malignant neoplasm of lower-outer quadrant of left breast of female, estrogen receptor positive ([place])     Problem List Patient Active Problem List   Diagnosis Date Noted   Genetic testing 10/04/2020   Family history of breast cancer    Family history of colon cancer    Family history of prostate cancer    Family history of stomach cancer    Malignant neoplasm of lower-outer quadrant of left breast of female, estrogen receptor positive ([place]) 09/22/2020   Anxiety 09/07/2015    [name], PT 05/11/2021, 12:11 PM  [facility] @ [address] [place], [place], [address] Phone: [phone]   Fax:  [phone]  Name: Amy Stephenson MRN: [id number] Date of Birth: [birth year]

## 2021-05-13 ENCOUNTER — Inpatient Hospital Stay: Payer: BC Managed Care – PPO | Attending: Genetic Counselor | Admitting: Adult Health

## 2021-05-13 ENCOUNTER — Encounter: Payer: Self-pay | Admitting: Adult Health

## 2021-05-13 ENCOUNTER — Other Ambulatory Visit: Payer: Self-pay

## 2021-05-13 VITALS — BP 124/88 | HR 88 | Temp 97.5°F | Resp 16 | Ht 63.0 in | Wt 152.6 lb

## 2021-05-13 DIAGNOSIS — Z79899 Other long term (current) drug therapy: Secondary | ICD-10-CM | POA: Insufficient documentation

## 2021-05-13 DIAGNOSIS — M549 Dorsalgia, unspecified: Secondary | ICD-10-CM | POA: Diagnosis not present

## 2021-05-13 DIAGNOSIS — M79605 Pain in left leg: Secondary | ICD-10-CM | POA: Insufficient documentation

## 2021-05-13 DIAGNOSIS — Z800 Family history of malignant neoplasm of digestive organs: Secondary | ICD-10-CM | POA: Insufficient documentation

## 2021-05-13 DIAGNOSIS — Z923 Personal history of irradiation: Secondary | ICD-10-CM | POA: Diagnosis not present

## 2021-05-13 DIAGNOSIS — M810 Age-related osteoporosis without current pathological fracture: Secondary | ICD-10-CM | POA: Diagnosis not present

## 2021-05-13 DIAGNOSIS — K219 Gastro-esophageal reflux disease without esophagitis: Secondary | ICD-10-CM | POA: Insufficient documentation

## 2021-05-13 DIAGNOSIS — Z881 Allergy status to other antibiotic agents status: Secondary | ICD-10-CM | POA: Insufficient documentation

## 2021-05-13 DIAGNOSIS — Z8042 Family history of malignant neoplasm of prostate: Secondary | ICD-10-CM | POA: Diagnosis not present

## 2021-05-13 DIAGNOSIS — Z882 Allergy status to sulfonamides status: Secondary | ICD-10-CM | POA: Diagnosis not present

## 2021-05-13 DIAGNOSIS — Z170 Estrogen receptor positive status [ER+]: Secondary | ICD-10-CM | POA: Diagnosis not present

## 2021-05-13 DIAGNOSIS — N6321 Unspecified lump in the left breast, upper outer quadrant: Secondary | ICD-10-CM | POA: Diagnosis not present

## 2021-05-13 DIAGNOSIS — Z803 Family history of malignant neoplasm of breast: Secondary | ICD-10-CM | POA: Insufficient documentation

## 2021-05-13 DIAGNOSIS — Z9049 Acquired absence of other specified parts of digestive tract: Secondary | ICD-10-CM | POA: Insufficient documentation

## 2021-05-13 DIAGNOSIS — E119 Type 2 diabetes mellitus without complications: Secondary | ICD-10-CM | POA: Insufficient documentation

## 2021-05-13 DIAGNOSIS — Z880 Allergy status to penicillin: Secondary | ICD-10-CM | POA: Insufficient documentation

## 2021-05-13 DIAGNOSIS — C50512 Malignant neoplasm of lower-outer quadrant of left female breast: Secondary | ICD-10-CM | POA: Diagnosis not present

## 2021-05-13 DIAGNOSIS — R0781 Pleurodynia: Secondary | ICD-10-CM | POA: Insufficient documentation

## 2021-05-13 NOTE — Progress Notes (Signed)
SURVIVORSHIP VISIT:    BRIEF ONCOLOGIC HISTORY:  Oncology History  Malignant neoplasm of lower-outer quadrant of left breast of female, estrogen receptor positive ([place])  09/10/2020 Initial Diagnosis   Screening mammogram showed a possible left breast mass, palpable on exam. Diagnostic mammogram and US showed a 1.2cm mass at the 5 o'clock position and no abnormal axillary lymph nodes. Biopsy showed invasive mammary carcinoma, grade 1-2, HER-2 equivocal by IHC (2+), negative by FISH (ratio 1.36), ER+>95%, PR+ 90%, Ki67 5%. B   09/10/2020 Cancer Staging   Staging form: Breast, AJCC 8th Edition - Clinical stage from 09/10/2020: Stage IB (cT2, cN0, cM0, G2, ER+, PR+, HER2-) - Signed by Gardenia Phlegm, NP on 09/22/2020 Stage prefix: Initial diagnosis Histologic grading system: 3 grade system    09/16/2020 Breast MRI   0.6cm mass in the upper outer quadrant, non-mass enhancement in the low outer quadrant, and the known malignancy spanning 2.1cm in the left breast with no evidence of right breast malignancy.    Genetic Testing   Negative genetic testing. No pathogenic variants identified on the [facility] STAT+Multi-Cancer Panel. The report date is 10/04/2020.  The STAT Breast cancer panel offered by [facility] includes sequencing and rearrangement analysis for the following 9 genes:  ATM, BRCA1, BRCA2, CDH1, CHEK2, PALB2, PTEN, STK11 and TP53.    The Multi-Cancer Panel + RNA offered by [facility] includes sequencing and/or deletion duplication testing of the following 84 genes: AIP, ALK, APC, ATM, AXIN2,BAP1,  BARD1, BLM, BMPR1A, BRCA1, BRCA2, BRIP1, CASR, CDC73, CDH1, CDK4, CDKN1B, CDKN1C, CDKN2A (p14ARF), CDKN2A (p16INK4a), CEBPA, CHEK2, CTNNA1, DICER1, DIS3L2, EGFR (c.2369C>T, p.Thr790Met variant only), EPCAM (Deletion/duplication testing only), FH, FLCN, GATA2, GPC3, GREM1 (Promoter region deletion/duplication testing only), HOXB13 (c.251G>A, p.Gly84Glu), HRAS, KIT, MAX, MEN1, MET, MITF (c.952G>A,  p.Glu318Lys variant only), MLH1, MSH2, MSH3, MSH6, MUTYH, NBN, NF1, NF2, NTHL1, PALB2, PDGFRA, PHOX2B, PMS2, POLD1, POLE, POT1, PRKAR1A, PTCH1, PTEN, RAD50, RAD51C, RAD51D, RB1, RECQL4, RET, RUNX1, SDHAF2, SDHA (sequence changes only), SDHB, SDHC, SDHD, SMAD4, SMARCA4, SMARCB1, SMARCE1, STK11, SUFU, TERC, TERT, TMEM127, TP53, TSC1, TSC2, VHL, WRN and WT1.   10/15/2020 Surgery   Left breast lumpectomy: IDC, grade 2, margins neg, 1 SLN neg; T1c, N0, M0, g2, ER+/PR+/HER-2-   10/15/2020 Oncotype testing   Score 16, ROR 4%   11/04/2020 Cancer Staging   Staging form: Breast, AJCC 8th Edition - Pathologic: Stage IA (pT1c, pN0, cM0, G2, ER+, PR+, HER2-) - Signed by Nicholas Lose, MD on 11/04/2020 Stage prefix: Initial diagnosis Histologic grading system: 3 grade system    11/17/2020 - 12/15/2020 Radiation Therapy   Adjuvant radiation therapy Site Technique Total Dose (Gy) Dose per Fx (Gy) Completed Fx Beam Energies  Breast, Left: Breast_Lt 3D 40.05/40.05 2.67 15/15 6XFFF  Breast, Left: Breast_Lt_Bst 3D 10/10 2 5/5 6X    03/17/2021 -  Anti-estrogen oral therapy   Declined due to extensive musculoskeletal aches and neuropathic pains     INTERVAL HISTORY:  Amy Stephenson to review her survivorship care plan detailing her treatment course for breast cancer, as well as monitoring long-term side effects of that treatment, education regarding health maintenance, screening, and overall wellness and health promotion.     Overall, Amy Stephenson reports feeling quite well.  She is recovering from her surgery and radiation.  She notes that the area under her breast that we saw her for a few months ago is improving.  She does have some left-sided deep rib pain since the radiation therapy.  She is undergoing physical therapy and dry needling  to her back and also her leg.  She has some sciatic pain that goes down her left leg.  This has been going on for several weeks.  She notes that she is following with [facility] and  is undergoing further imaging next week.  REVIEW OF SYSTEMS:  Review of Systems  Constitutional:  Negative for appetite change, chills, fatigue, fever and unexpected weight change.  HENT:   Negative for hearing loss, lump/mass and trouble swallowing.   Eyes:  Negative for eye problems and icterus.  Respiratory:  Negative for chest tightness, cough and shortness of breath.   Cardiovascular:  Negative for chest pain, leg swelling and palpitations.  Gastrointestinal:  Negative for abdominal distention, abdominal pain, constipation, diarrhea, nausea and vomiting.  Endocrine: Negative for hot flashes.  Genitourinary:  Negative for difficulty urinating.   Musculoskeletal:  Positive for back pain. Negative for arthralgias.  Skin:  Negative for itching and rash.  Neurological:  Negative for dizziness, extremity weakness, headaches and numbness.  Hematological:  Negative for adenopathy. Does not bruise/bleed easily.  Psychiatric/Behavioral:  Negative for depression. The patient is not nervous/anxious.   Breast: Denies any new nodularity, masses, tenderness, nipple changes, or nipple discharge.      ONCOLOGY TREATMENT TEAM:  1. Surgeon:  Dr. Dwain Sarna at [facility] 2. Medical Oncologist: Dr. Pamelia Hoit  3. Radiation Oncologist: Dr. Basilio Cairo    PAST MEDICAL/SURGICAL HISTORY:  Past Medical History:  Diagnosis Date   Diabetes mellitus without complication ([facility])    Family history of breast cancer    Family history of colon cancer    Family history of prostate cancer    Family history of stomach cancer    Personal history of radiation therapy    Past Surgical History:  Procedure Laterality Date   BREAST LUMPECTOMY     BREAST LUMPECTOMY WITH RADIOACTIVE SEED AND SENTINEL LYMPH NODE BIOPSY Left 10/15/2020   Procedure: LEFT BREAST LUMPECTOMY WITH RADIOACTIVE SEED AND LEFT AXILLARY SENTINEL LYMPH NODE BIOPSY;  Surgeon: [name], MD;  Location: [facility];   Service: General;  Laterality: Left;  90 MINUTES ROOM 8   CHOLECYSTECTOMY     COLONOSCOPY       ALLERGIES:  Allergies  Allergen Reactions   Penicillins Hives   Augmentin [Amoxicillin-Pot Clavulanate] Swelling and Rash   Sulfa Antibiotics Swelling and Rash     CURRENT MEDICATIONS:  Outpatient Encounter Medications as of 05/13/2021  Medication Sig   ALPRAZolam (XANAX) 0.25 MG tablet Take 0.25 mg by mouth as needed.   glipiZIDE (GLUCOTROL XL) 5 MG 24 hr tablet Take 5 mg by mouth daily with breakfast.   MULTIPLE VITAMIN PO Take 0.5 tablets by mouth.    [DISCONTINUED] Dapagliflozin-metFORMIN HCl ER 03-999 MG TB24    anastrozole (ARIMIDEX) 1 MG tablet Take 1 tablet (1 mg total) by mouth daily. (Patient not taking: Reported on 04/15/2021)   escitalopram (LEXAPRO) 10 MG tablet Take 1 tablet (10 mg total) by mouth daily. Needs ov. Thanks.   gabapentin (NEURONTIN) 300 MG capsule Take 1 capsule (300 mg total) by mouth at bedtime. (Patient not taking: Reported on 04/15/2021)   SitaGLIPtin-MetFORMIN HCl [phone] MG TB24 Janumet XR 100 mg-1,000 mg tablet,extended release   zolpidem (AMBIEN) 10 MG tablet zolpidem 10 mg tablet   No facility-administered encounter medications on file as of 05/13/2021.     ONCOLOGIC FAMILY HISTORY:  Family History  Problem Relation Age of Onset   Breast cancer Mother 58   Colon cancer Father 48  Breast cancer Maternal Aunt        dx 62s, d. 41   Esophageal cancer Paternal Uncle        dx 5s   Stomach cancer Paternal Grandmother 81   Prostate cancer Paternal Grandfather 79   Breast cancer Cousin        dx 57s     GENETIC COUNSELING/TESTING:   SOCIAL HISTORY:  Social History   Socioeconomic History   Marital status: Married    Spouse name: Not on file   Number of children: Not on file   Years of education: Not on file   Highest education level: Not on file  Occupational History   Not on file  Tobacco Use   Smoking status: Never    Smokeless tobacco: Never  Vaping Use   Vaping Use: Never used  Substance and Sexual Activity   Alcohol use: No   Drug use: Never   Sexual activity: Yes    Birth control/protection: Post-menopausal  Other Topics Concern   Not on file  Social History Narrative   Not on file   Social Determinants of Health   Financial Resource Strain: Not on file  Food Insecurity: Not on file  Transportation Needs: Not on file  Physical Activity: Not on file  Stress: Not on file  Social Connections: Not on file  Intimate Partner Violence: Not on file     OBSERVATIONS/OBJECTIVE:  BP 124/88 (BP Location: Right Arm, Patient Position: Sitting)   Pulse 88   Temp (!) 97.5 F (36.4 C) (Temporal)   Resp 16   Ht $R'5\' 3"'Sy$  (1.6 m)   Wt 152 lb 9.6 oz (69.2 kg)   SpO2 100%   BMI 27.03 kg/m  GENERAL: Patient is a well appearing female in no acute distress HEENT:  Sclerae anicteric.  Oropharynx clear and moist. No ulcerations or evidence of oropharyngeal candidiasis. Neck is supple.  NODES:  No cervical, supraclavicular, or axillary lymphadenopathy palpated.  BREAST EXAM: Left breast is status postlumpectomy and radiation, there is no sign of recurrence.  Right breast is benign LUNGS:  Clear to auscultation bilaterally.  No wheezes or rhonchi. HEART:  Regular rate and rhythm. No murmur appreciated. ABDOMEN:  Soft, nontender.  Positive, normoactive bowel sounds. No organomegaly palpated. MSK:  No focal spinal tenderness to palpation. Full range of motion bilaterally in the upper extremities. EXTREMITIES:  No peripheral edema.   SKIN:  Clear with no obvious rashes or skin changes. No nail dyscrasia. NEURO:  Nonfocal. Well oriented.  Appropriate affect.   LABORATORY DATA:  None for this visit.  DIAGNOSTIC IMAGING:  None for this visit.      ASSESSMENT AND PLAN:  Amy Stephenson is a pleasant 55 y.o. female with Stage IA left breast invasive ductal carcinoma, ER+/PR+/HER2-, diagnosed in 08/2020, treated  with lumpectomy, adjuvant radiation.  She presents to the Survivorship Clinic for our initial meeting and routine follow-up post-completion of treatment for breast cancer.    1. Stage IA left breast cancer:  Amy Stephenson is continuing to recover from definitive treatment for breast cancer. She will follow-up with her medical oncologist, Dr. Lindi Adie in 6 months with history and physical exam per surveillance protocol.   Her mammogram is due 08/2021; orders placed today.  Today, a comprehensive survivorship care plan and treatment summary was reviewed with the patient today detailing her breast cancer diagnosis, treatment course, potential late/long-term effects of treatment, appropriate follow-up care with recommendations for the future, and patient education resources.  A copy  of this summary, along with a letter will be sent to the patient's primary care provider via mail/fax/In Basket message after today's visit.    2.  Sciatic pain.  This is being managed with [facility].  She is undergoing the appropriate imaging.  As it does need to be fully worked up.  3. Bone health: Stephenson underwent bone density testing on January 14, 2021.  This showed osteoporosis with a T score of -2.7 in the lumbar spine.  She was given education on specific activities to promote bone health.  4. Cancer screening:  Due to Amy Stephenson's history and her age, she should receive screening for skin cancers, colon cancer, and gynecologic cancers.  The information and recommendations are listed on the patient's comprehensive care plan/treatment summary and were reviewed in detail with the patient.    5. Health maintenance and wellness promotion: Amy Stephenson was encouraged to consume 5-7 servings of fruits and vegetables per day. We reviewed the "Nutrition Rainbow" handout.  She was also encouraged to engage in moderate to vigorous exercise for 30 minutes per day most days of the week. We discussed the [employer] fitness program, which  is designed for cancer survivors to help them become more physically fit after cancer treatments.  She was instructed to limit her alcohol consumption and continue to abstain from tobacco use.     6. Support services/counseling: It is not uncommon for this period of the patient's cancer care trajectory to be one of many emotions and stressors. She was given information regarding our available services and encouraged to contact me with any questions or for help enrolling in any of our support group/programs.    Follow up instructions:    -Return to cancer center in 6 months for follow-up with Dr. Lindi Adie -Mammogram due in March 2023 -Follow up with surgery in 1 year -She is welcome to return back to the Survivorship Clinic at any time; no additional follow-up needed at this time.  -Consider referral back to survivorship as a long-term survivor for continued surveillance  The patient was provided an opportunity to ask questions and all were answered. The patient agreed with the plan and demonstrated an understanding of the instructions.   Total encounter time: 40 minutes in chart review, lab review, care coordination, face-to-face visit time, order entry, and documentation of the encounter   Wilber Bihari, NP 05/13/21 10:42 AM Medical Oncology and Hematology [facility] [place], [place] [address] Tel. [phone]    Fax. [phone]  *Total Encounter Time as defined by the Centers for Medicare and Medicaid Services includes, in addition to the face-to-face time of a patient visit (documented in the note above) non-face-to-face time: obtaining and reviewing outside history, ordering and reviewing medications, tests or procedures, care coordination (communications with other health care professionals or caregivers) and documentation in the medical record.

## 2021-05-16 ENCOUNTER — Telehealth: Payer: Self-pay | Admitting: Hematology and Oncology

## 2021-05-16 ENCOUNTER — Other Ambulatory Visit: Payer: Self-pay

## 2021-05-16 ENCOUNTER — Ambulatory Visit: Payer: BC Managed Care – PPO

## 2021-05-16 DIAGNOSIS — R293 Abnormal posture: Secondary | ICD-10-CM

## 2021-05-16 DIAGNOSIS — R600 Localized edema: Secondary | ICD-10-CM | POA: Diagnosis not present

## 2021-05-16 NOTE — Therapy (Addendum)
[facility] @ [place], [place], [address] Phone: [phone]   Fax:  [phone]  Physical Therapy Treatment  Patient Details  Name: Amy Stephenson MRN: [id number] Date of Birth: May 27, 1966 Referring Provider (PT): Dr. Isidore Moos   Encounter Date: 05/16/2021   PT End of Session - 05/16/21 1533     Visit Number 8    Date for PT Re-Evaluation 05/27/21    PT Start Time 1446    PT Stop Time 1529    PT Time Calculation (min) 43 min    Activity Tolerance Patient tolerated treatment well    Behavior During Therapy [facility] for tasks assessed/performed             Past Medical History:  Diagnosis Date   Diabetes mellitus without complication ([place])    Family history of breast cancer    Family history of colon cancer    Family history of prostate cancer    Family history of stomach cancer    Personal history of radiation therapy     Past Surgical History:  Procedure Laterality Date   BREAST LUMPECTOMY     BREAST LUMPECTOMY WITH RADIOACTIVE SEED AND SENTINEL LYMPH NODE BIOPSY Left 10/15/2020   Procedure: LEFT BREAST LUMPECTOMY WITH RADIOACTIVE SEED AND LEFT AXILLARY SENTINEL LYMPH NODE BIOPSY;  Surgeon: Rolm Bookbinder, MD;  Location: [facility];  Service: General;  Laterality: Left;  90 MINUTES ROOM 8   CHOLECYSTECTOMY     COLONOSCOPY      There were no vitals filed for this visit.   Subjective Assessment - 05/16/21 1354     Subjective I am having a lot of stiffness in my Lt lats and rhomboids.    Pertinent History left breast cancer found on mammogram 08/2020   She underwent Left Lumpectomy with SLNB on 10/15/2020 and was diagnosed with Gr. 2 IDC ER+, PR+, Her 2 - with Ki67 5%.She  had radiation from 11/17/2020 - 12/15/2020.  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  presently being treated for sciatic pain and nerve pain in arm.  She has a small left shoulder RTC tear    Patient Stated Goals Be able to feel comfortable that I am doing the right things, decrease pain, get rid of strands    Currently in Pain? Yes    Pain Score 3     Pain Location Rib cage    Pain Orientation Left    Pain Descriptors / Indicators Aching;Dull    Pain Onset 1 to 4 weeks ago    Pain Frequency Intermittent    Aggravating Factors  rain, weather today    Pain Relieving Factors stretching, massage                               [facility] Adult PT Treatment/Exercise - 05/16/21 0001       Manual Therapy   Manual therapy comments skilled palpation and monitoring for DN    Soft tissue mobilization soft tissue work to left Pecs and Lats in supine and for elongation and tissue mobility along Lt ribs, lats, posterior deltoids, rhomboids and subscapularis              Trigger Point Dry Needling - 05/16/21 0001  Consent Given? Yes    Education Handout Provided Yes    Muscles Treated Upper Quadrant Rhomboids;Latissimus dorsi;Subscapularis;Infraspinatus;Deltoid   Lt only   Rhomboids Response Twitch response elicited;Palpable increased muscle length    Infraspinatus Response Twitch response elicited;Palpable increased muscle length    Subscapularis Response Twitch response elicited;Palpable increased muscle length    Deltoid Response Twitch response elicited;Palpable increased muscle length    Latissimus dorsi Response Twitch response elicited;Palpable increased muscle length                   PT Education - 05/16/21 1449     Education Details DN info    Person(s) Educated Patient    Methods Explanation;Handout    Comprehension Verbalized understanding;Returned demonstration                 PT Long Term Goals - 04/15/21 1255       PT LONG TERM GOAL #1   Title Pt will be independent in Self left breast MLD    Time 6     Period Weeks    Target Date 05/27/21      PT LONG TERM GOAL #2   Title Pt will be fit for compression bra and will note decreased left breast discomfort while wearing    Time 6    Period Weeks    Status New    Target Date 05/27/21      PT LONG TERM GOAL #3   Title Pt will have decreased left upper quarter discomfort by atleast 50%    Time 6    Period Weeks    Status New    Target Date 05/27/21                   Plan - 05/16/21 1532     Clinical Impression Statement Pt with Lt sided tension and trigger points after breast cancer surgery and radiation.  Pt with multiple trigger points and good twitch response in all muscles with improved tissue mobility and reduced tension after manual therapy and DN today.  Pt will continue to benefit from skilled PT to address Lt quarter mobility.    PT Frequency 2x / week    PT Duration 6 weeks    PT Treatment/Interventions ADLs/Self Care Home Management;Therapeutic exercise;Therapeutic activities;Patient/family education    PT Next Visit Plan STM pecs/lats/scap area Traps, left breast MLD and review with pt again, AROM flex,scap, HA, scar mobs,supine scap series    Consulted and Agree with Plan of Care Patient             Patient will benefit from skilled therapeutic intervention in order to improve the following deficits and impairments:  Increased edema, Postural dysfunction, Pain, Decreased knowledge of precautions, Decreased activity tolerance, Impaired UE functional use, Increased fascial restricitons  Visit Diagnosis: Abnormal posture     Problem List Patient Active Problem List   Diagnosis Date Noted   Acid reflux 05/13/2021   Diabetes mellitus ([place]) 04/21/2021   DDD (degenerative disc disease), cervical 03/06/2021   Genetic testing 10/04/2020   Family history of breast cancer    Family history of colon cancer    Family history of prostate cancer    Family history of stomach cancer    Malignant neoplasm of  lower-outer quadrant of left breast of female, estrogen receptor positive ([place]) 09/22/2020   Anxiety 09/07/2015   Sigurd Sos, PT 05/16/21 3:37 PM   [facility] @ [place] [address]  [place], [place], [address] Phone: [phone]   Fax:  [phone]  Name: ZYIONNA PESCE MRN: [id number] Date of Birth: Jan 19, 1966

## 2021-05-16 NOTE — Therapy (Signed)
[facility] @ [place], [place], [address] Phone: [phone]   Fax:  [phone]  Physical Therapy Evaluation  Patient Details  Name: Amy Stephenson MRN: [id number] Date of Birth: [birth year] Referring Provider (PT): Dr. Isidore Moos   Encounter Date: 04/15/2021    Past Medical History:  Diagnosis Date   Diabetes mellitus without complication ([place])    Family history of breast cancer    Family history of colon cancer    Family history of prostate cancer    Family history of stomach cancer    Personal history of radiation therapy     Past Surgical History:  Procedure Laterality Date   BREAST LUMPECTOMY     BREAST LUMPECTOMY WITH RADIOACTIVE SEED AND SENTINEL LYMPH NODE BIOPSY Left 10/15/2020   Procedure: LEFT BREAST LUMPECTOMY WITH RADIOACTIVE SEED AND LEFT AXILLARY SENTINEL LYMPH NODE BIOPSY;  Surgeon: Rolm Bookbinder, MD;  Location: [facility];  Service: General;  Laterality: Left;  90 MINUTES ROOM 8   CHOLECYSTECTOMY     COLONOSCOPY      There were no vitals filed for this visit.                    Objective measurements completed on examination: See above findings.                     PT Long Term Goals - 04/15/21 1255       PT LONG TERM GOAL #1   Title Pt will be independent in Self left breast MLD    Time 6    Period Weeks    Target Date 05/27/21      PT LONG TERM GOAL #2   Title Pt will be fit for compression bra and will note decreased left breast discomfort while wearing    Time 6    Period Weeks    Status New    Target Date 05/27/21      PT LONG TERM GOAL #3   Title Pt will have decreased left upper quarter discomfort by atleast 50%    Time 6    Period Weeks    Status New    Target Date 05/27/21                     Patient will benefit from skilled therapeutic intervention in order to improve the following deficits and  impairments:  Increased edema, Postural dysfunction, Pain, Decreased knowledge of precautions, Decreased activity tolerance, Impaired UE functional use, Increased fascial restricitons  Visit Diagnosis: Aftercare following surgery for neoplasm - Plan: PT plan of care cert/re-cert  Localized edema - Plan: PT plan of care cert/re-cert  Abnormal posture - Plan: PT plan of care cert/re-cert  Malignant neoplasm of lower-outer quadrant of left breast of female, estrogen receptor positive ([place]) - Plan: PT plan of care cert/re-cert     Problem List Patient Active Problem List   Diagnosis Date Noted   Acid reflux 05/13/2021   Diabetes mellitus ([place]) 04/21/2021   DDD (degenerative disc disease), cervical 03/06/2021   Genetic testing 10/04/2020   Family history of breast cancer    Family history of colon cancer    Family history of prostate cancer    Family history of stomach cancer    Malignant neoplasm of lower-outer quadrant of left breast of female, estrogen receptor positive ([place]) 09/22/2020   Anxiety 09/07/2015    [name], PT 05/16/2021, 4:39  PM  [facility] @ [place], [place], [address] Phone: [phone]   Fax:  [phone]  Name: CHRISTOL THETFORD MRN: [id number] Date of Birth: 12/30/1965

## 2021-05-16 NOTE — Patient Instructions (Signed)

## 2021-05-16 NOTE — Addendum Note (Signed)
Addended by: [name] on: 05/16/2021 04:57 PM   Modules accepted: Orders

## 2021-05-16 NOTE — Telephone Encounter (Signed)
Scheduled appointment per 11/18 los. Patient is aware.

## 2021-05-23 ENCOUNTER — Ambulatory Visit (HOSPITAL_COMMUNITY)
Admission: RE | Admit: 2021-05-23 | Discharge: 2021-05-23 | Disposition: A | Discharge status: Home / Self Care | Payer: BC Managed Care – PPO | Source: Ambulatory Visit | Attending: Hematology and Oncology | Admitting: Hematology and Oncology

## 2021-05-23 ENCOUNTER — Other Ambulatory Visit: Payer: Self-pay

## 2021-05-23 DIAGNOSIS — Z170 Estrogen receptor positive status [ER+]: Secondary | ICD-10-CM | POA: Diagnosis present

## 2021-05-23 DIAGNOSIS — C50512 Malignant neoplasm of lower-outer quadrant of left female breast: Secondary | ICD-10-CM | POA: Insufficient documentation

## 2021-05-23 DIAGNOSIS — M25552 Pain in left hip: Secondary | ICD-10-CM | POA: Insufficient documentation

## 2021-05-23 IMAGING — NM NM BONE WHOLE BODY
2 series · 2 of 2 positions shown · non-contrast
Comparison: Breast MRI [DATE].  Left rib series [DATE].

CLINICAL DATA: Left breast cancer.

EXAM:
NUCLEAR MEDICINE WHOLE BODY BONE SCAN
TECHNIQUE: Whole body anterior and posterior images were obtained approximately
3 hours after intravenous injection of radiopharmaceutical.
RADIOPHARMACEUTICALS:  19.7 mCi [BK] MDP IV

[Series 1: whole body · 2.66mm/px · 1 of 1 slices shown (1 of 2)]
[im 1/1]
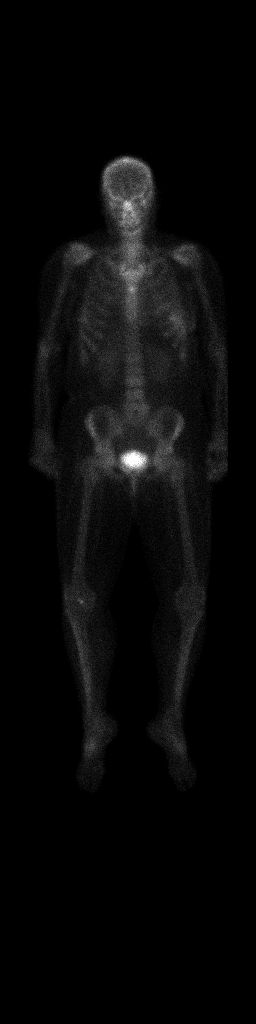

[Series 1: whole body · 2.66mm/px · 1 of 1 slices shown (2 of 2)]
[im 1/1]
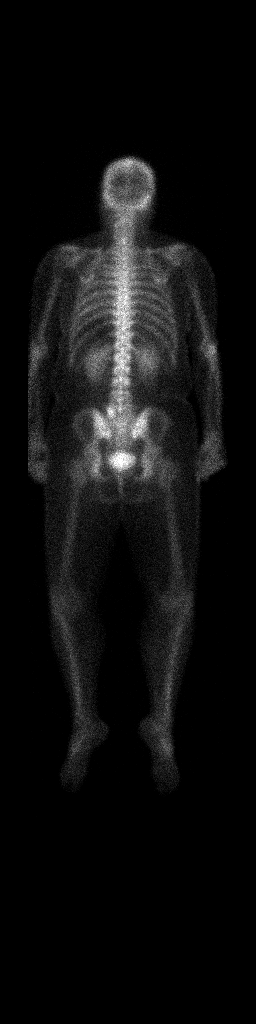

[2 of 2 positions shown; findings below may reference images not displayed]

FINDINGS: Bilateral renal function excretion. Punctate area of increased
activity noted over the sternum. Sternal series suggested for
further evaluation. Questionable punctate areas of increased
activity noted about the posterolateral right seventh and eighth
ribs. Right rib series suggested for further evaluation. Focal area
of increased activity noted about the left side of the L5 vertebral
body. Although this may be degenerative, metastatic disease cannot
be excluded and MRI of the lumbar spine suggested for further
evaluation. Mild increased activity right knee, most likely
degenerative. Mild increased activity noted over the left breast,
this may be related to the left breast cancer.
IMPRESSION: 1. Punctate area of increased activity noted over the sternum.
Sternal series suggested for further evaluation.

2. Questionable punctate areas of increased activity noted about the
posterolateral right seventh and eighth ribs. Right rib series
suggested for further evaluation.

3. Focal area of increased activity noted about the left side of the
L5 vertebral body. Although this may be degenerative, metastatic
disease cannot be excluded MRI of the lumbar spine suggested for
further evaluation.

4. Mild increased activity noted the left breast, this may be
related to the left breast cancer.

## 2021-05-23 MED ORDER — TECHNETIUM TC 99M MEDRONATE IV KIT
20.0000 | PACK | Freq: Once | INTRAVENOUS | Status: AC | PRN
  Administered 2021-05-23: 11:00:00 19.7 via INTRAVENOUS

## 2021-05-24 ENCOUNTER — Encounter: Payer: Self-pay | Admitting: Physical Therapy

## 2021-05-24 ENCOUNTER — Ambulatory Visit: Payer: BC Managed Care – PPO | Admitting: Physical Therapy

## 2021-05-24 ENCOUNTER — Encounter: Payer: Self-pay | Admitting: Hematology and Oncology

## 2021-05-24 DIAGNOSIS — C50512 Malignant neoplasm of lower-outer quadrant of left female breast: Secondary | ICD-10-CM

## 2021-05-24 DIAGNOSIS — R600 Localized edema: Secondary | ICD-10-CM | POA: Diagnosis not present

## 2021-05-24 DIAGNOSIS — R293 Abnormal posture: Secondary | ICD-10-CM

## 2021-05-24 DIAGNOSIS — Z170 Estrogen receptor positive status [ER+]: Secondary | ICD-10-CM

## 2021-05-24 DIAGNOSIS — Z483 Aftercare following surgery for neoplasm: Secondary | ICD-10-CM

## 2021-05-24 NOTE — Therapy (Signed)
[facility] @ [place], [place], [address] Phone: [phone]   Fax:  [phone]  Physical Therapy Treatment  Patient Details  Name: Amy Stephenson MRN: [id number] Date of Birth: 01/17/1966 Referring Provider (PT): Dr. Donne Hazel   Encounter Date: 05/24/2021   PT End of Session - 05/24/21 1115     Visit Number 9    Number of Visits 13    Date for PT Re-Evaluation 05/27/21    PT Start Time 1110    PT Stop Time 7048    PT Time Calculation (min) 48 min    Activity Tolerance Patient tolerated treatment well    Behavior During Therapy [facility] for tasks assessed/performed             Past Medical History:  Diagnosis Date   Diabetes mellitus without complication ([place])    Family history of breast cancer    Family history of colon cancer    Family history of prostate cancer    Family history of stomach cancer    Personal history of radiation therapy     Past Surgical History:  Procedure Laterality Date   BREAST LUMPECTOMY     BREAST LUMPECTOMY WITH RADIOACTIVE SEED AND SENTINEL LYMPH NODE BIOPSY Left 10/15/2020   Procedure: LEFT BREAST LUMPECTOMY WITH RADIOACTIVE SEED AND LEFT AXILLARY SENTINEL LYMPH NODE BIOPSY;  Surgeon: Rolm Bookbinder, MD;  Location: [facility];  Service: General;  Laterality: Left;  90 MINUTES ROOM 8   CHOLECYSTECTOMY     COLONOSCOPY      There were no vitals filed for this visit.   Subjective Assessment - 05/24/21 1111     Subjective The dry needling was a crazy miracle. A lot of the knots seemed to go. I still have some stiffness and I am still sore.    Pertinent History left breast cancer found on mammogram 08/2020   She underwent Left Lumpectomy with SLNB on 10/15/2020 and was diagnosed with Gr. 2 IDC ER+, PR+, Her 2 - with Ki67 5%.She  had radiation from 11/17/2020 - 12/15/2020.  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  presently being treated for sciatic pain and nerve pain in arm.  She has a small left shoulder RTC tear    Patient Stated Goals Be able to feel comfortable that I am doing the right things, decrease pain, get rid of strands    Currently in Pain? Yes    Pain Score 1     Pain Location --   trunk   Pain Orientation Left    Pain Descriptors / Indicators Aching    Pain Type Acute pain    Pain Onset 1 to 4 weeks ago    Pain Frequency Intermittent    Aggravating Factors  over exertion    Pain Relieving Factors rest    Effect of Pain on Daily Activities has to be careful not to over due things                               [facility] Adult PT Treatment/Exercise - 05/24/21 0001       Manual Therapy   Soft tissue mobilization soft tissue work in SL to lats and scapular area. scar mobilization to inferior brest  incision    Manual Lymphatic Drainage (MLD) in supine. to left breast:  short neck, right axillary LN's, anterior interaxillary pathway, left inginal nodes and establishment of L axillo-inguinal pathway and left breast directing to pathways and retracing all steps and ending with LN's                          PT Long Term Goals - 04/15/21 1255       PT LONG TERM GOAL #1   Title Pt will be independent in Self left breast MLD    Time 6    Period Weeks    Target Date 05/27/21      PT LONG TERM GOAL #2   Title Pt will be fit for compression bra and will note decreased left breast discomfort while wearing    Time 6    Period Weeks    Status New    Target Date 05/27/21      PT LONG TERM GOAL #3   Title Pt will have decreased left upper quarter discomfort by atleast 50%    Time 6    Period Weeks    Status New    Target Date 05/27/21                   Plan - 05/24/21 1207     Clinical Impression Statement Pt reports she feels much better after  the dry needling and that her knots are improved. Today therapist was unable to palpate the knots as much as before. Continued with manual lymphatic drainage to L breast to decrease swelling and also soft tissue mobilization to decrease scar tissue in inferior breast then worked on soft tissue mobilization to L serratus, lats and rhomboids.    PT Frequency 2x / week    PT Duration 6 weeks    PT Treatment/Interventions ADLs/Self Care Home Management;Therapeutic exercise;Therapeutic activities;Patient/family education;Manual techniques;Scar mobilization;Manual lymph drainage;Taping    PT Next Visit Plan [facility] next, compression bra? Foam help sports bra,STM pecs/lats/scap area Traps, left breast MLD and instruct pt    PT Home Exercise Plan left breast MLD    Consulted and Agree with Plan of Care Patient             Patient will benefit from skilled therapeutic intervention in order to improve the following deficits and impairments:  Increased edema, Postural dysfunction, Pain, Decreased knowledge of precautions, Decreased activity tolerance, Impaired UE functional use, Increased fascial restricitons  Visit Diagnosis: Localized edema  Aftercare following surgery for neoplasm  Abnormal posture  Malignant neoplasm of lower-outer quadrant of left breast of female, estrogen receptor positive ([place])     Problem List Patient Active Problem List   Diagnosis Date Noted   Acid reflux 05/13/2021   Diabetes mellitus ([place]) 04/21/2021   DDD (degenerative disc disease), cervical 03/06/2021   Genetic testing 10/04/2020   Family history of breast cancer    Family history of colon cancer    Family history of prostate cancer    Family history of stomach cancer    Malignant neoplasm of lower-outer quadrant of left breast of female, estrogen receptor positive ([place]) 09/22/2020   Anxiety 09/07/2015    Manus Gunning, PT 05/24/2021, 12:10 PM  [facility] @ [place], [place], [address] Phone: [phone]   Fax:  [phone]  Name: [name] MRN: [id number] Date of Birth: 05-26-1966  Manus Gunning, PT  05/24/21 12:10 PM

## 2021-05-25 ENCOUNTER — Ambulatory Visit: Payer: BC Managed Care – PPO

## 2021-05-25 ENCOUNTER — Other Ambulatory Visit: Payer: Self-pay

## 2021-05-25 ENCOUNTER — Encounter: Payer: Self-pay | Admitting: Hematology and Oncology

## 2021-05-25 ENCOUNTER — Other Ambulatory Visit: Payer: Self-pay | Admitting: *Deleted

## 2021-05-25 DIAGNOSIS — R600 Localized edema: Secondary | ICD-10-CM

## 2021-05-25 DIAGNOSIS — C50512 Malignant neoplasm of lower-outer quadrant of left female breast: Secondary | ICD-10-CM

## 2021-05-25 DIAGNOSIS — R293 Abnormal posture: Secondary | ICD-10-CM

## 2021-05-25 DIAGNOSIS — Z483 Aftercare following surgery for neoplasm: Secondary | ICD-10-CM

## 2021-05-25 NOTE — Therapy (Signed)
[facility] @ [place], [place], [address] Phone: [phone]   Fax:  [phone]  Physical Therapy Treatment  Patient Details  Name: Amy Stephenson MRN: [id number] Date of Birth: [birth year] Referring Provider (PT): Dr. Donne Hazel   Encounter Date: 05/25/2021   PT End of Session - 05/25/21 1150     Visit Number 10    Number of Visits 13    Date for PT Re-Evaluation 05/27/21    PT Start Time 1106    PT Stop Time 1150    PT Time Calculation (min) 44 min    Activity Tolerance Patient tolerated treatment well    Behavior During Therapy [facility] for tasks assessed/performed             Past Medical History:  Diagnosis Date   Diabetes mellitus without complication ([place])    Family history of breast cancer    Family history of colon cancer    Family history of prostate cancer    Family history of stomach cancer    Personal history of radiation therapy     Past Surgical History:  Procedure Laterality Date   BREAST LUMPECTOMY     BREAST LUMPECTOMY WITH RADIOACTIVE SEED AND SENTINEL LYMPH NODE BIOPSY Left 10/15/2020   Procedure: LEFT BREAST LUMPECTOMY WITH RADIOACTIVE SEED AND LEFT AXILLARY SENTINEL LYMPH NODE BIOPSY;  Surgeon: Rolm Bookbinder, MD;  Location: [facility];  Service: General;  Laterality: Left;  90 MINUTES ROOM 8   CHOLECYSTECTOMY     COLONOSCOPY      There were no vitals filed for this visit.   Subjective Assessment - 05/25/21 1108     Subjective I am set up for several more sessions of dry needling. I feel some good relief from the first session. It has made a big difference. The sciatica has also benefitted from DN. Feel like there is still some fluid on the sides    Patient Stated Goals Be able to feel comfortable that I am doing the right things, decrease pain, get rid of strands    Currently in Pain? Yes    Pain Location Axilla    Pain Orientation Left                                [facility] Adult PT Treatment/Exercise - 05/25/21 0001       Manual Therapy   Soft tissue mobilization soft tissue work in supine to pecs and lats,SL to lats,serratus and scapular area. scar mobilization to axillary and inferior breast incision    Manual Lymphatic Drainage (MLD) in supine. to left breast:  short neck, right axillary LN's, anterior interaxillary pathway, left inginal nodes and establishment of L axillo-inguinal pathway and left breast directing to pathways and retracing all steps and ending with LN's                          PT Long Term Goals - 04/15/21 1255       PT LONG TERM GOAL #1   Title Pt will be independent in Self left breast MLD    Time 6    Period Weeks    Target Date 05/27/21      PT LONG TERM GOAL #2   Title Pt will be fit for compression bra and will note decreased left breast discomfort while wearing    Time 6  Period Weeks    Status New    Target Date 05/27/21      PT LONG TERM GOAL #3   Title Pt will have decreased left upper quarter discomfort by atleast 50%    Time 6    Period Weeks    Status New    Target Date 05/27/21                   Plan - 05/25/21 1150     Clinical Impression Statement Pt continues to feel improvement from DN and has several more DN appts set up. Continued scar mobilization to both incisisions and STM to pecs, serratus, lats, scapular area. Several knots palpable today but not tender for pt. Also performed MLD to left breast area with only mild swelling noted.    Personal Factors and Comorbidities Comorbidity 2    Comorbidities left breast cancer, DM    Examination-Activity Limitations Lift;Reach Overhead    Stability/Clinical Decision Making Stable/Uncomplicated    Rehab Potential Excellent    PT Frequency 2x / week    PT Duration 6 weeks    PT Treatment/Interventions ADLs/Self Care Home Management;Therapeutic exercise;Therapeutic  activities;Patient/family education;Manual techniques;Scar mobilization;Manual lymph drainage;Taping;Dry needling    PT Next Visit Plan recert or DC,compression bra? Foam help sports bra,STM pecs/lats/scap area Traps, left breast MLD and instruct pt, TB exs    PT Home Exercise Plan left breast MLD    Consulted and Agree with Plan of Care Patient             Patient will benefit from skilled therapeutic intervention in order to improve the following deficits and impairments:  Increased edema, Postural dysfunction, Pain, Decreased knowledge of precautions, Decreased activity tolerance, Impaired UE functional use, Increased fascial restricitons  Visit Diagnosis: Localized edema  Aftercare following surgery for neoplasm  Abnormal posture  Malignant neoplasm of lower-outer quadrant of left breast of female, estrogen receptor positive ([place])     Problem List Patient Active Problem List   Diagnosis Date Noted   Acid reflux 05/13/2021   Diabetes mellitus ([place]) 04/21/2021   DDD (degenerative disc disease), cervical 03/06/2021   Genetic testing 10/04/2020   Family history of breast cancer    Family history of colon cancer    Family history of prostate cancer    Family history of stomach cancer    Malignant neoplasm of lower-outer quadrant of left breast of female, estrogen receptor positive ([place]) 09/22/2020   Anxiety 09/07/2015    [name], PT 05/25/2021, 12:04 PM  [facility] @ [place], [place], [address] Phone: [phone]   Fax:  [phone]  Name: Amy Stephenson MRN: [id number] Date of Birth: 11-Nov-1965

## 2021-05-25 NOTE — Progress Notes (Signed)
Per MD based on pt recent bone scan, pt needing additional testing with MRI lumbar spine, rib xray and sternum xray.  Orders placed, PA obtained and pt notified/ verbalized understanding of appt date and time.

## 2021-05-28 ENCOUNTER — Ambulatory Visit (HOSPITAL_COMMUNITY)
Admission: RE | Admit: 2021-05-28 | Discharge: 2021-05-28 | Disposition: A | Discharge status: Home / Self Care | Payer: BC Managed Care – PPO | Source: Ambulatory Visit | Attending: Hematology and Oncology | Admitting: Hematology and Oncology

## 2021-05-28 DIAGNOSIS — Z170 Estrogen receptor positive status [ER+]: Secondary | ICD-10-CM | POA: Diagnosis present

## 2021-05-28 DIAGNOSIS — C50512 Malignant neoplasm of lower-outer quadrant of left female breast: Secondary | ICD-10-CM | POA: Diagnosis present

## 2021-05-28 IMAGING — MR MR LUMBAR SPINE WO/W CM
6 of 7 series · 31 of 48 positions shown · IV contrast (7 ML GADAVIST)
Comparison: None.

CLINICAL DATA: Breast cancer, staging.

EXAM:
MRI LUMBAR SPINE WITHOUT AND WITH CONTRAST
TECHNIQUE: Multiplanar and multiecho pulse sequences of the lumbar spine were
obtained without and with intravenous contrast.
CONTRAST:  7mL GADAVIST GADOBUTROL 1 MMOL/ML IV SOLN

[Series 5: T1 · sagittal · 4.0mm · 0.81mm/px · 5 of 17 slices shown (1 of 2)]
[im 1/17]
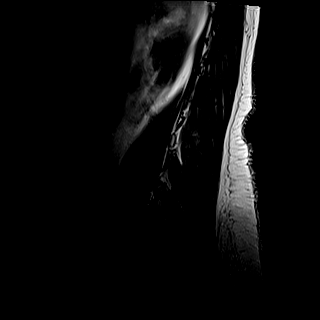
[im 5/17]
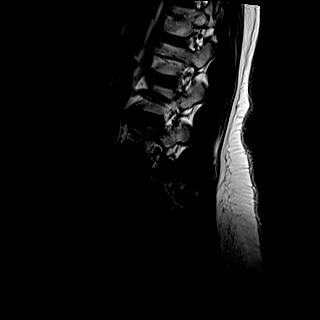
[im 9/17]
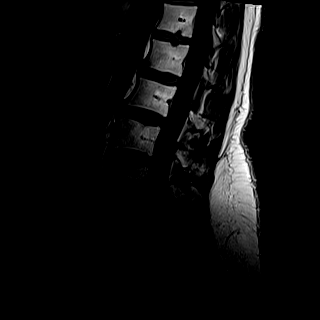
[im 13/17]
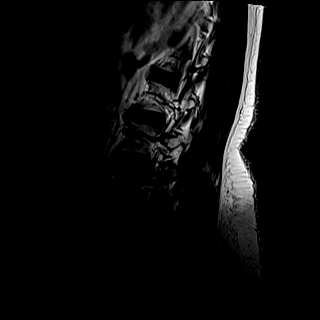
[im 17/17]
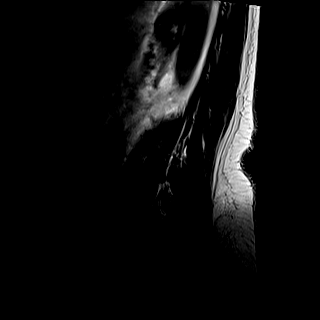

[Series 6: STIR · sagittal · 4.0mm · 0.51mm/px · 2 of 17 slices shown]
[im 1/17]
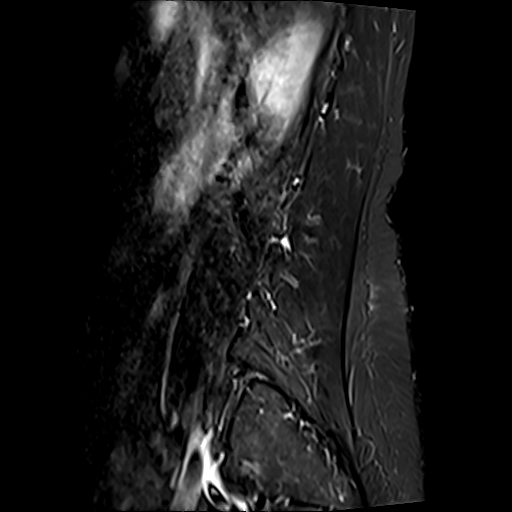
[im 5/17]
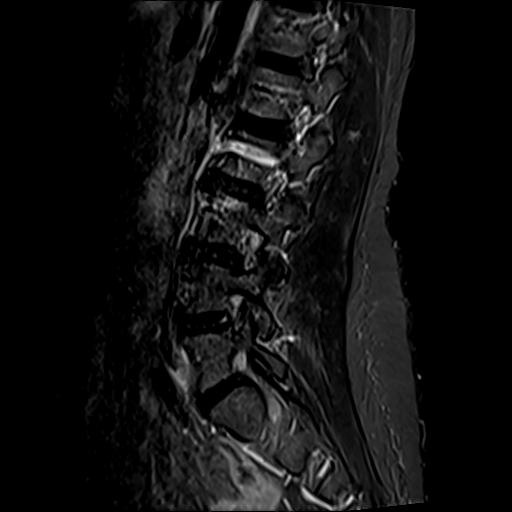

[Series 7: T2 · axial · 4.0mm · 0.62mm/px · z∈[-129,+85]mm · 8 of 43 slices shown]
[im 1/43]
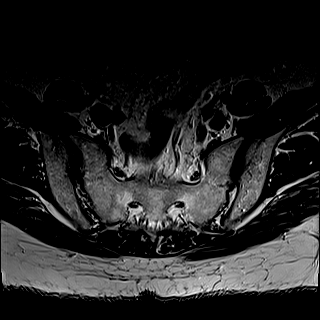
[im 5/43]
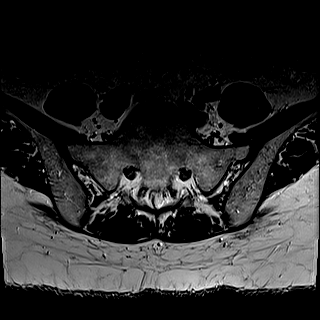
[im 15/43]
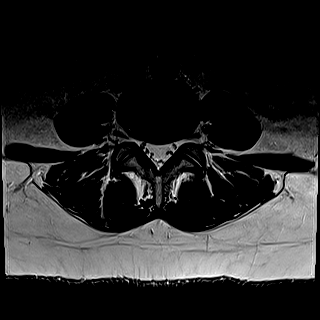
[im 19/43]
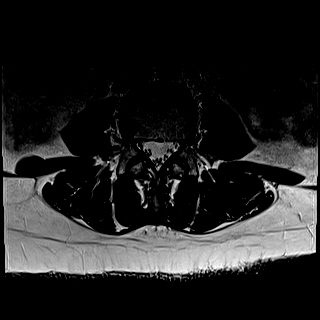
[im 24/43]
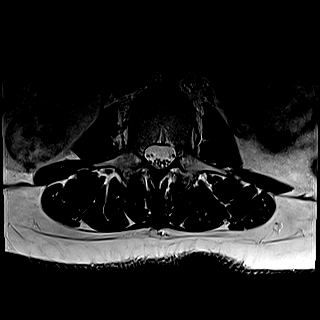
[im 29/43]
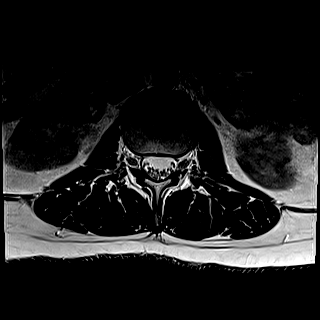
[im 38/43]
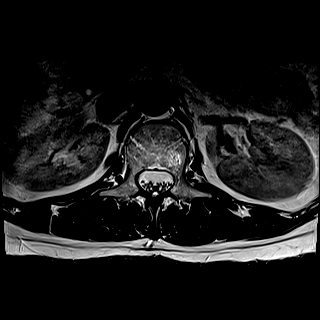
[im 43/43]
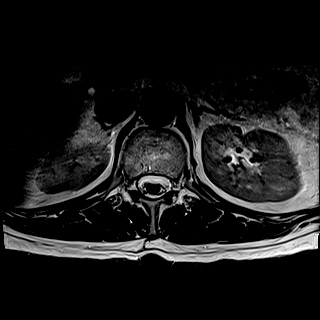

[Series 8: T1 · axial · 4.0mm · 0.39mm/px · z∈[-129,+85]mm · 8 of 43 slices shown (2 of 2)]
[im 1/43]
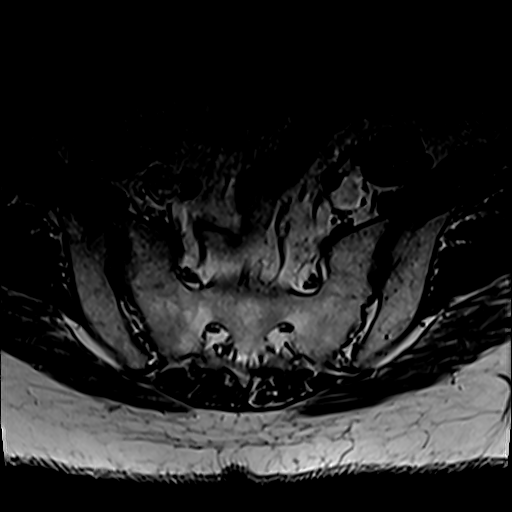
[im 5/43]
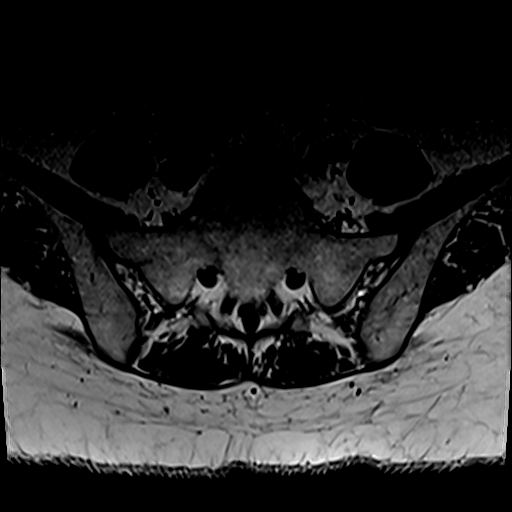
[im 15/43]
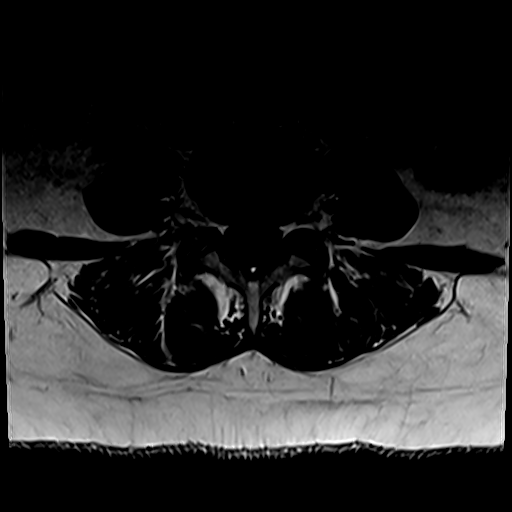
[im 19/43]
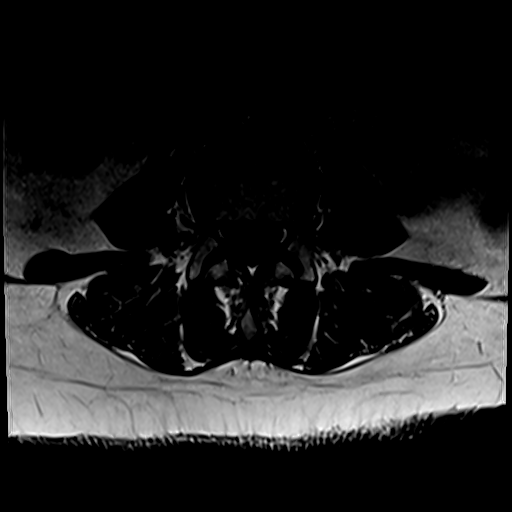
[im 24/43]
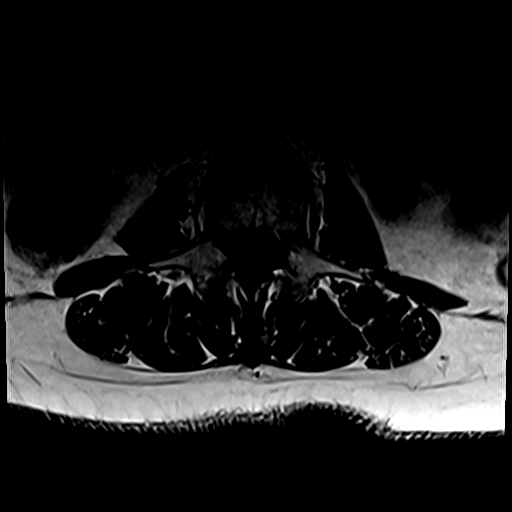
[im 29/43]
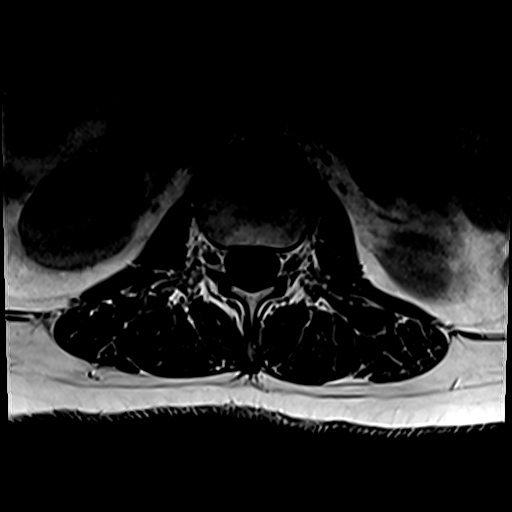
[im 38/43]
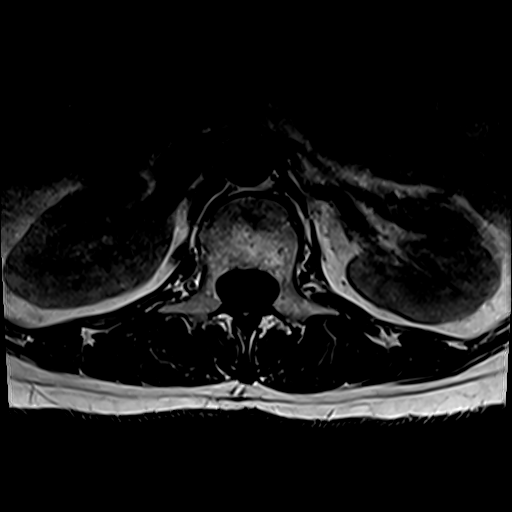
[im 43/43]
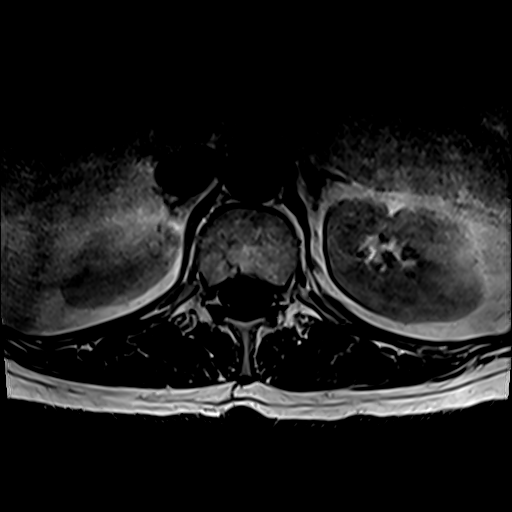

[Series 9: T2 post-contrast · sagittal · 4.0mm · 0.81mm/px · 4 of 17 slices shown]
[im 1/17]
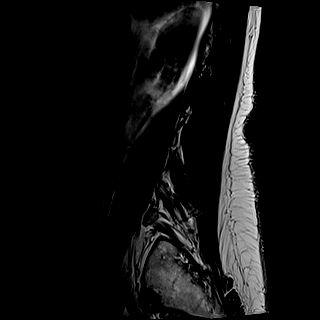
[im 6/17]
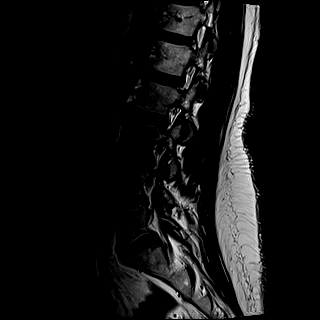
[im 11/17]
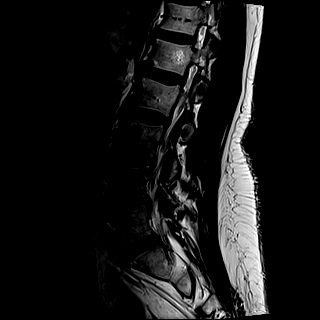
[im 17/17]
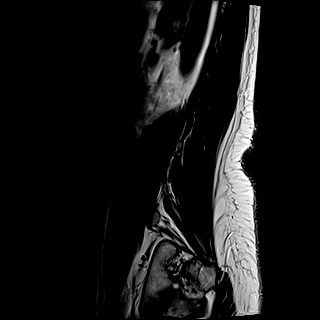

[Series 10: T1 fat-sat post-contrast · sagittal · 4.0mm · 0.81mm/px · 4 of 17 slices shown]
[im 1/17]
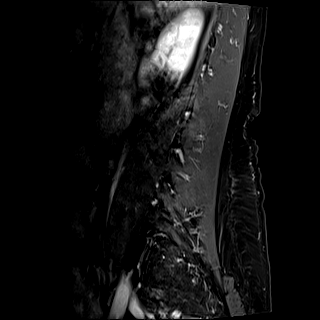
[im 6/17]
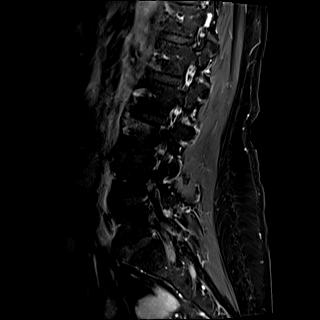
[im 11/17]
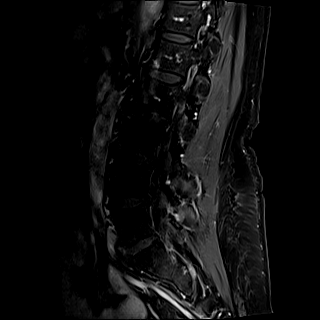
[im 17/17]
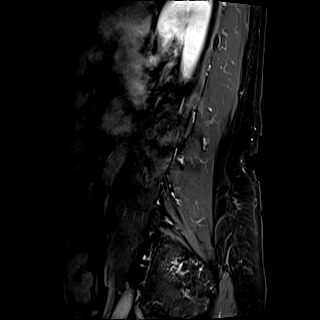

[31 of 48 positions shown; findings below may reference images not displayed]

FINDINGS: Segmentation: 5 non rib-bearing lumbar type vertebral bodies are
present. The lowest fully formed vertebral body is L5.

Alignment: No significant listhesis is present. Lumbar lordosis is
preserved.

Vertebrae: Marrow signal and vertebral body heights are normal. No
pathologic enhancement is present. Edema enhancement anterior
inferior endplate L1 degenerative. No other marrow enhancement
present suggest metastatic disease.

Conus medullaris and cauda equina: Conus extends to the L1 level.
Conus and cauda equina appear normal.

Paraspinal and other soft tissues: Limited imaging the abdomen is
unremarkable. There is no significant adenopathy. No solid organ
lesions are present.

Disc levels:

L1-2: Negative.

L2-3: Mild disc bulging is present. No significant stenosis is
present.

L3-4: Mild disc bulging is present. No significant focal protrusion
or stenosis is present.

L4-5: A central disc protrusion is present. This potentially
contacts the traversing right L5 nerve roots. Foramina are patent
bilaterally.

L5-S1: Mild facet hypertrophy is worse left than right. No
significant stenosis is present.
IMPRESSION: 1. No evidence for metastatic disease to the lumbar spine.
2. Central disc protrusion at L4-5 potentially contacts the
traversing right L5 nerve roots.
3. Mild disc bulging at L2-3 and L3-4 without significant stenosis.
4. Mild facet hypertrophy at L5-S1 without significant stenosis.

## 2021-05-28 MED ORDER — GADOBUTROL 1 MMOL/ML IV SOLN
7.0000 mL | Freq: Once | INTRAVENOUS | Status: AC | PRN
  Administered 2021-05-28: 7 mL via INTRAVENOUS

## 2021-05-31 ENCOUNTER — Ambulatory Visit: Payer: BC Managed Care – PPO

## 2021-05-31 ENCOUNTER — Other Ambulatory Visit: Payer: Self-pay

## 2021-05-31 DIAGNOSIS — Z170 Estrogen receptor positive status [ER+]: Secondary | ICD-10-CM | POA: Insufficient documentation

## 2021-05-31 DIAGNOSIS — Z483 Aftercare following surgery for neoplasm: Secondary | ICD-10-CM | POA: Insufficient documentation

## 2021-05-31 DIAGNOSIS — R293 Abnormal posture: Secondary | ICD-10-CM

## 2021-05-31 DIAGNOSIS — C50512 Malignant neoplasm of lower-outer quadrant of left female breast: Secondary | ICD-10-CM | POA: Insufficient documentation

## 2021-05-31 DIAGNOSIS — R600 Localized edema: Secondary | ICD-10-CM | POA: Insufficient documentation

## 2021-05-31 NOTE — Therapy (Signed)
[facility] @ [place], [place], [address] Phone: [phone]   Fax:  [phone]  Physical Therapy Treatment  Patient Details  Name: Amy Stephenson MRN: [id number] Date of Birth: Jul 25, 1965 Referring Provider (PT): Dr. Donne Hazel   Encounter Date: 05/31/2021   PT End of Session - 05/31/21 1205     Visit Number 11    Number of Visits 21    Date for PT Re-Evaluation 06/28/21    PT Start Time 1108    PT Stop Time 1205    PT Time Calculation (min) 57 min    Activity Tolerance Patient tolerated treatment well    Behavior During Therapy [facility] for tasks assessed/performed             Past Medical History:  Diagnosis Date   Diabetes mellitus without complication ([place])    Family history of breast cancer    Family history of colon cancer    Family history of prostate cancer    Family history of stomach cancer    Personal history of radiation therapy     Past Surgical History:  Procedure Laterality Date   BREAST LUMPECTOMY     BREAST LUMPECTOMY WITH RADIOACTIVE SEED AND SENTINEL LYMPH NODE BIOPSY Left 10/15/2020   Procedure: LEFT BREAST LUMPECTOMY WITH RADIOACTIVE SEED AND LEFT AXILLARY SENTINEL LYMPH NODE BIOPSY;  Surgeon: Rolm Bookbinder, MD;  Location: [facility];  Service: General;  Laterality: Left;  90 MINUTES ROOM 8   CHOLECYSTECTOMY     COLONOSCOPY      There were no vitals filed for this visit.   Subjective Assessment - 05/31/21 1111     Subjective The DN is really helping the Lt lateral trunk tightness and trigger points I was experiencing. My husband and I have been so surprised at how much they shrink. Overall I am feeling so much better and the Lt rib pain is my biggest complaint, although that is even improved some in intensity.    Pertinent History left breast cancer found on mammogram 08/2020   She underwent Left Lumpectomy with SLNB on 10/15/2020 and was diagnosed with Gr. 2 IDC  ER+, PR+, Her 2 - with Ki67 5%.She  had radiation from 11/17/2020 - 12/15/2020.  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  presently being treated for sciatic pain and nerve pain in arm.  She has a small left shoulder RTC tear    Patient Stated Goals Be able to feel comfortable that I am doing the right things, decrease pain, get rid of strands    Currently in Pain? Yes    Pain Score 3     Pain Location Rib cage    Pain Orientation Left    Pain Descriptors / Indicators Aching    Pain Type Acute pain    Pain Onset 1 to 4 weeks ago    Pain Frequency Intermittent    Aggravating Factors  over exertion    Pain Relieving Factors rest                               [facility] Adult PT Treatment/Exercise - 05/31/21 0001       Manual Therapy   Soft tissue mobilization soft tissue work in Rt S/L  to lats and scapular area. scar mobilization to inferior breast incision    Manual Lymphatic Drainage (MLD) in supine. to left breast:  short neck, 5 diaphragmatic breaths, right axillary LN's, anterior interaxillary pathway, left inginal nodes and establishment of L axillo-inguinal pathway and left breast directing to pathways and retracing all steps and ending with LN's                          PT Long Term Goals - 05/31/21 1206       PT LONG TERM GOAL #1   Title Pt will be independent in Self left breast MLD    Baseline Pt is independent in this - 05/31/21    Status Achieved      PT LONG TERM GOAL #2   Title Pt will be fit for compression bra and will note decreased left breast discomfort while wearing    Status On-going      PT LONG TERM GOAL #3   Title Pt will have decreased left upper quarter discomfort by atleast 50%    Baseline Pt did not rate today but reports this has imrpoved, especially since starting DN - 05/31/21     Status Partially Met                   Plan - 05/31/21 1207     Clinical Impression Statement Renewal done this visit as pt reports she is making excellent progress thus far with DN and from manual therapy as well. She is progressing well towards pain reduction progressing well towards that goal. She has met goals of being independent with self MLD.    Personal Factors and Comorbidities Comorbidity 2    Comorbidities left breast cancer, DM    Examination-Activity Limitations Lift;Reach Overhead    Stability/Clinical Decision Making Stable/Uncomplicated    Rehab Potential Excellent    PT Frequency 2x / week    PT Duration 4 weeks    PT Treatment/Interventions ADLs/Self Care Home Management;Therapeutic exercise;Therapeutic activities;Patient/family education;Manual techniques;Scar mobilization;Manual lymph drainage;Taping;Dry needling    PT Next Visit Plan Renewal done this visit; ,compression bra? Foam help sports bra,STM pecs/lats/scap area Traps, left breast MLD and instruct pt, TB exs    PT Home Exercise Plan left breast MLD    Consulted and Agree with Plan of Care Patient             Patient will benefit from skilled therapeutic intervention in order to improve the following deficits and impairments:  Increased edema, Postural dysfunction, Pain, Decreased knowledge of precautions, Decreased activity tolerance, Impaired UE functional use, Increased fascial restricitons  Visit Diagnosis: Localized edema  Aftercare following surgery for neoplasm  Abnormal posture  Malignant neoplasm of lower-outer quadrant of left breast of female, estrogen receptor positive ([place])     Problem List Patient Active Problem List   Diagnosis Date Noted   Acid reflux 05/13/2021   Diabetes mellitus ([place]) 04/21/2021   DDD (degenerative disc disease), cervical 03/06/2021   Genetic testing 10/04/2020   Family history of breast cancer    Family history of colon cancer    Family  history of prostate cancer    Family history of stomach cancer    Malignant neoplasm of lower-outer quadrant of left breast of female, estrogen receptor positive ([place]) 09/22/2020   Anxiety 09/07/2015    Otelia Limes, PTA 05/31/2021, 1:26 PM  [facility] @ [place],  [place], [address] Phone: [phone]   Fax:  [phone]  Name: Amy Stephenson MRN: [id number] Date of Birth: Nov 06, 1965

## 2021-05-31 NOTE — Addendum Note (Signed)
Addended by: Manus Gunning L on: 05/31/2021 01:47 PM   Modules accepted: Orders

## 2021-06-01 ENCOUNTER — Ambulatory Visit: Payer: BC Managed Care – PPO

## 2021-06-01 ENCOUNTER — Ambulatory Visit: Payer: BC Managed Care – PPO | Attending: General Surgery

## 2021-06-01 DIAGNOSIS — Z170 Estrogen receptor positive status [ER+]: Secondary | ICD-10-CM

## 2021-06-01 DIAGNOSIS — R600 Localized edema: Secondary | ICD-10-CM | POA: Insufficient documentation

## 2021-06-01 DIAGNOSIS — R293 Abnormal posture: Secondary | ICD-10-CM

## 2021-06-01 DIAGNOSIS — C50512 Malignant neoplasm of lower-outer quadrant of left female breast: Secondary | ICD-10-CM | POA: Insufficient documentation

## 2021-06-01 DIAGNOSIS — Z483 Aftercare following surgery for neoplasm: Secondary | ICD-10-CM

## 2021-06-01 NOTE — Therapy (Signed)
[facility] @ [place], [place], [address] Phone: [phone]   Fax:  [phone]  Physical Therapy Treatment  Patient Details  Name: Amy Stephenson MRN: [id number] Date of Birth: 01/10/1966 Referring Provider (PT): Dr. Donne Hazel   Encounter Date: 06/01/2021   PT End of Session - 06/01/21 1116     Visit Number 12    Number of Visits 21    Date for PT Re-Evaluation 06/28/21    PT Start Time 1105    PT Stop Time 1152    PT Time Calculation (min) 47 min    Activity Tolerance Patient tolerated treatment well    Behavior During Therapy [facility] for tasks assessed/performed             Past Medical History:  Diagnosis Date   Diabetes mellitus without complication ([place])    Family history of breast cancer    Family history of colon cancer    Family history of prostate cancer    Family history of stomach cancer    Personal history of radiation therapy     Past Surgical History:  Procedure Laterality Date   BREAST LUMPECTOMY     BREAST LUMPECTOMY WITH RADIOACTIVE SEED AND SENTINEL LYMPH NODE BIOPSY Left 10/15/2020   Procedure: LEFT BREAST LUMPECTOMY WITH RADIOACTIVE SEED AND LEFT AXILLARY SENTINEL LYMPH NODE BIOPSY;  Surgeon: Rolm Bookbinder, MD;  Location: [facility];  Service: General;  Laterality: Left;  90 MINUTES ROOM 8   CHOLECYSTECTOMY     COLONOSCOPY      There were no vitals filed for this visit.   Subjective Assessment - 06/01/21 1109     Subjective I had a bone scan and it showed potential problems but I have had multiple XRays and a lumbar MRI.  It was all clear. The left ribs still hurt some and it comes and goes.  It is sore down in there and a little in my breast.    Pertinent History left breast cancer found on mammogram 08/2020   She underwent Left Lumpectomy with SLNB on 10/15/2020 and was diagnosed with Gr. 2 IDC ER+, PR+, Her 2 - with Ki67 5%.She  had radiation from 11/17/2020 -  12/15/2020.  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  presently being treated for sciatic pain and nerve pain in arm.  She has a small left shoulder RTC tear    Patient Stated Goals Be able to feel comfortable that I am doing the right things, decrease pain, get rid of strands    Currently in Pain? Yes    Pain Score 5     Pain Location Rib cage    Pain Orientation Left    Pain Descriptors / Indicators Aching;Sore;Tender    Pain Onset 1 to 4 weeks ago    Pain Frequency Intermittent    Multiple Pain Sites No                               [facility] Adult PT Treatment/Exercise - 06/01/21 0001       Manual Therapy   Soft tissue mobilization soft tissue work in supine to pectorals and lats, and in Rt S/L to lats and scapular area. scar mobilization to inferior breast incision  Manual Lymphatic Drainage (MLD) in supine. to left breast:  short neck, 5 diaphragmatic breaths, right axillary LN's, anterior interaxillary pathway, left inginal nodes and establishment of L axillo-inguinal pathway and left breast directing to pathways and retracing all steps and ending with LN's                          PT Long Term Goals - 05/31/21 1206       PT LONG TERM GOAL #1   Title Pt will be independent in Self left breast MLD    Baseline Pt is independent in this - 05/31/21    Status Achieved      PT LONG TERM GOAL #2   Title Pt will be fit for compression bra and will note decreased left breast discomfort while wearing    Status On-going      PT LONG TERM GOAL #3   Title Pt will have decreased left upper quarter discomfort by atleast 50%    Baseline Pt did not rate today but reports this has imrpoved, especially since starting DN - 05/31/21    Status Partially Met                   Plan - 06/01/21 1152      Clinical Impression Statement Continued soft tissue mobilization to decrease tissue tension and MLD to decrease swelling at left breast, in addition to scar mobilization to inferior incision.  Increased tissue tension still noted along left lateral ribs with mild tenderness. Pt reported feeling better after rx.    Personal Factors and Comorbidities Comorbidity 2    Comorbidities left breast cancer, DM    Examination-Activity Limitations Lift;Reach Overhead    Stability/Clinical Decision Making Stable/Uncomplicated    Rehab Potential Excellent    PT Frequency 2x / week    PT Duration 4 weeks    PT Treatment/Interventions ADLs/Self Care Home Management;Therapeutic exercise;Therapeutic activities;Patient/family education;Manual techniques;Scar mobilization;Manual lymph drainage;Taping;Dry needling    PT Next Visit Plan compression bra? Foam help sports bra,STM pecs/lats/scap area Traps, left breast MLD and instruct pt, TB exs, DN    PT Home Exercise Plan left breast MLD    Consulted and Agree with Plan of Care Patient             Patient will benefit from skilled therapeutic intervention in order to improve the following deficits and impairments:  Increased edema, Postural dysfunction, Pain, Decreased knowledge of precautions, Decreased activity tolerance, Impaired UE functional use, Increased fascial restricitons  Visit Diagnosis: Localized edema  Aftercare following surgery for neoplasm  Abnormal posture  Malignant neoplasm of lower-outer quadrant of left breast of female, estrogen receptor positive ([place])     Problem List Patient Active Problem List   Diagnosis Date Noted   Acid reflux 05/13/2021   Diabetes mellitus ([place]) 04/21/2021   DDD (degenerative disc disease), cervical 03/06/2021   Genetic testing 10/04/2020   Family history of breast cancer    Family history of colon cancer    Family history of prostate cancer    Family history of stomach cancer    Malignant  neoplasm of lower-outer quadrant of left breast of female, estrogen receptor positive ([place]) 09/22/2020   Anxiety 09/07/2015    [name], PT 06/01/2021, 12:02 PM  [facility] @ [place], [place], [address] Phone: [phone]   Fax:  [phone]  Name: Amy Stephenson MRN: [id number] Date of  Birth: 06-02-1966

## 2021-06-01 NOTE — Therapy (Signed)
[facility] @ [facility] [place], [place], [address] Phone: [phone]   Fax:  [phone]  Physical Therapy Treatment  Patient Details  Name: Amy Stephenson MRN: [id number] Date of Birth: [birth year] Referring Provider (PT): Dr. Donne Hazel   Encounter Date: 06/01/2021   PT End of Session - 06/01/21 1320     Visit Number 13    Date for PT Re-Evaluation 06/28/21    PT Start Time 1233    PT Stop Time 6546    PT Time Calculation (min) 44 min    Activity Tolerance Patient tolerated treatment well    Behavior During Therapy [facility] for tasks assessed/performed             Past Medical History:  Diagnosis Date   Diabetes mellitus without complication ([place])    Family history of breast cancer    Family history of colon cancer    Family history of prostate cancer    Family history of stomach cancer    Personal history of radiation therapy     Past Surgical History:  Procedure Laterality Date   BREAST LUMPECTOMY     BREAST LUMPECTOMY WITH RADIOACTIVE SEED AND SENTINEL LYMPH NODE BIOPSY Left 10/15/2020   Procedure: LEFT BREAST LUMPECTOMY WITH RADIOACTIVE SEED AND LEFT AXILLARY SENTINEL LYMPH NODE BIOPSY;  Surgeon: Rolm Bookbinder, MD;  Location: [facility];  Service: General;  Laterality: Left;  90 MINUTES ROOM 8   CHOLECYSTECTOMY     COLONOSCOPY      There were no vitals filed for this visit.   Subjective Assessment - 06/01/21 1236     Subjective The dry needling really helped me. My husband said the knots in my back are so much better.    Currently in Pain? Yes    Pain Score 5     Pain Location Rib cage    Pain Orientation Left    Pain Descriptors / Indicators Aching                               [facility] Adult PT Treatment/Exercise - 06/01/21 1316       Manual Therapy   Manual Therapy Soft tissue mobilization;Myofascial release    Manual therapy comments skilled palpation  and monitoring for DN    Soft tissue mobilization elongation and release to Lt ribs and Lt thoracic region              Trigger Point Dry Needling - 06/01/21 0001     Consent Given? Yes    Education Handout Provided Yes    Muscles Treated Upper Quadrant Rhomboids;Latissimus dorsi;Subscapularis;Infraspinatus;Deltoid   Lt only   Rhomboids Response Twitch response elicited;Palpable increased muscle length    Infraspinatus Response Twitch response elicited;Palpable increased muscle length    Subscapularis Response Twitch response elicited;Palpable increased muscle length    Deltoid Response Twitch response elicited;Palpable increased muscle length    Latissimus dorsi Response Twitch response elicited;Palpable increased muscle length   along lateral ribs on Lt                       PT Long Term Goals - 05/31/21 1206       PT LONG TERM GOAL #1   Title Pt will be independent in Self left breast MLD    Baseline Pt is independent in this - 05/31/21    Status Achieved  PT LONG TERM GOAL #2   Title Pt will be fit for compression bra and will note decreased left breast discomfort while wearing    Status On-going      PT LONG TERM GOAL #3   Title Pt will have decreased left upper quarter discomfort by atleast 50%    Baseline Pt did not rate today but reports this has imrpoved, especially since starting DN - 05/31/21    Status Partially Met                   Plan - 06/01/21 1320     Clinical Impression Statement Pt with Lt sided tension and trigger points after breast cancer surgery and radiation.  Pt with multiple trigger points and good twitch response in all muscles with improved tissue mobility and reduced tension after manual therapy and DN today. Overall tissue mobility is improved and pt reports >50% overall reduced tension since her first needling session. PT also addressed Lt lats at lateral ribs today.  Pt will continue to benefit from skilled PT to  address Lt quarter mobility.    PT Frequency 2x / week    PT Duration 4 weeks    PT Treatment/Interventions ADLs/Self Care Home Management;Therapeutic exercise;Therapeutic activities;Patient/family education;Manual techniques;Scar mobilization;Manual lymph drainage;Taping;Dry needling    PT Next Visit Plan DN as needed to address tissue mobility and trigger points    PT Home Exercise Plan left breast MLD    Consulted and Agree with Plan of Care Patient             Patient will benefit from skilled therapeutic intervention in order to improve the following deficits and impairments:  Increased edema, Postural dysfunction, Pain, Decreased knowledge of precautions, Decreased activity tolerance, Impaired UE functional use, Increased fascial restricitons  Visit Diagnosis: Aftercare following surgery for neoplasm  Abnormal posture     Problem List Patient Active Problem List   Diagnosis Date Noted   Acid reflux 05/13/2021   Diabetes mellitus ([place]) 04/21/2021   DDD (degenerative disc disease), cervical 03/06/2021   Genetic testing 10/04/2020   Family history of breast cancer    Family history of colon cancer    Family history of prostate cancer    Family history of stomach cancer    Malignant neoplasm of lower-outer quadrant of left breast of female, estrogen receptor positive ([place]) 09/22/2020   Anxiety 09/07/2015   Sigurd Sos, PT 06/01/21 1:22 PM   [facility] @ [place], [place], [address] Phone: [phone]   Fax:  [phone]  Name: Amy Stephenson MRN: [id number] Date of Birth: 05/08/1966

## 2021-06-07 ENCOUNTER — Ambulatory Visit: Payer: BC Managed Care – PPO

## 2021-06-07 ENCOUNTER — Other Ambulatory Visit: Payer: Self-pay

## 2021-06-07 DIAGNOSIS — R293 Abnormal posture: Secondary | ICD-10-CM

## 2021-06-07 DIAGNOSIS — Z483 Aftercare following surgery for neoplasm: Secondary | ICD-10-CM

## 2021-06-07 DIAGNOSIS — C50512 Malignant neoplasm of lower-outer quadrant of left female breast: Secondary | ICD-10-CM

## 2021-06-07 DIAGNOSIS — Z170 Estrogen receptor positive status [ER+]: Secondary | ICD-10-CM

## 2021-06-07 DIAGNOSIS — R600 Localized edema: Secondary | ICD-10-CM

## 2021-06-07 NOTE — Therapy (Signed)
[facility] @ [place], [place], [address] Phone: [phone]   Fax:  [phone]  Physical Therapy Treatment  Patient Details  Name: Amy Stephenson MRN: [id number] Date of Birth: 1965/12/01 Referring Provider (PT): Dr. Donne Hazel   Encounter Date: 06/07/2021   PT End of Session - 06/07/21 1116     Visit Number 14    Number of Visits 21    Date for PT Re-Evaluation 06/28/21    PT Start Time 1106    PT Stop Time 1155    PT Time Calculation (min) 49 min    Activity Tolerance Patient tolerated treatment well    Behavior During Therapy [facility] for tasks assessed/performed             Past Medical History:  Diagnosis Date   Diabetes mellitus without complication ([place])    Family history of breast cancer    Family history of colon cancer    Family history of prostate cancer    Family history of stomach cancer    Personal history of radiation therapy     Past Surgical History:  Procedure Laterality Date   BREAST LUMPECTOMY     BREAST LUMPECTOMY WITH RADIOACTIVE SEED AND SENTINEL LYMPH NODE BIOPSY Left 10/15/2020   Procedure: LEFT BREAST LUMPECTOMY WITH RADIOACTIVE SEED AND LEFT AXILLARY SENTINEL LYMPH NODE BIOPSY;  Surgeon: Rolm Bookbinder, MD;  Location: [facility];  Service: General;  Laterality: Left;  90 MINUTES ROOM 8   CHOLECYSTECTOMY     COLONOSCOPY      There were no vitals filed for this visit.   Subjective Assessment - 06/07/21 1107     Subjective I did well until Sunday.  I was achy all over in the sciatic area and the ribs, shoulders.  I think it was the barometric pressure. Today I feel better, but I didn't sleep well last night and I am tired.  More sore today in the left breast. It always feels better after the MLD.    Pertinent History left breast cancer found on mammogram 08/2020   She underwent Left Lumpectomy with SLNB on 10/15/2020 and was diagnosed with Gr. 2 IDC ER+, PR+,  Her 2 - with Ki67 5%.She  had radiation from 11/17/2020 - 12/15/2020.  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  presently being treated for sciatic pain and nerve pain in arm.  She has a small left shoulder RTC tear    Patient Stated Goals Be able to feel comfortable that I am doing the right things, decrease pain, get rid of strands    Currently in Pain? Yes    Pain Score 3     Pain Location Breast    Pain Orientation Left    Pain Descriptors / Indicators Aching;Dull    Pain Onset More than a month ago    Pain Frequency Intermittent    Multiple Pain Sites No                               [facility] Adult PT Treatment/Exercise - 06/07/21 0001       Manual Therapy   Soft tissue mobilization soft tissue work in supine to pecs and lats and in  Rt S/L to lats and scapular  area. scar mobilization to inferior breast incision    Manual Lymphatic Drainage (MLD) in supine. to left breast:  short neck, 5 diaphragmatic breaths, right axillary LN's, anterior interaxillary pathway, left inginal nodes and establishment of L axillo-inguinal pathway and left breast directing to pathways and retracing all steps and ending with LN's                          PT Long Term Goals - 05/31/21 1206       PT LONG TERM GOAL #1   Title Pt will be independent in Self left breast MLD    Baseline Pt is independent in this - 05/31/21    Status Achieved      PT LONG TERM GOAL #2   Title Pt will be fit for compression bra and will note decreased left breast discomfort while wearing    Status On-going      PT LONG TERM GOAL #3   Title Pt will have decreased left upper quarter discomfort by atleast 50%    Baseline Pt did not rate today but reports this has imrpoved, especially since starting DN - 05/31/21    Status Partially Met                    Plan - 06/07/21 1117     Clinical Impression Statement Continued soft tissue mobilization and left breast MLD.  Multiple tender points still noted in lats/rib area today.  MLD improves achiness at breast, and pt is compliant with performing daily at home, but doesn't feel she gets as good a result as she gets when we do it.    Personal Factors and Comorbidities Comorbidity 2    Comorbidities left breast cancer, DM    Examination-Activity Limitations Lift;Reach Overhead    Stability/Clinical Decision Making Stable/Uncomplicated    Rehab Potential Excellent    PT Frequency 2x / week    PT Duration 4 weeks    PT Treatment/Interventions ADLs/Self Care Home Management;Therapeutic exercise;Therapeutic activities;Patient/family education;Manual techniques;Scar mobilization;Manual lymph drainage;Taping;Dry needling    PT Next Visit Plan DN as needed to address tissue mobility and trigger points, STM, MLD, scar mobs    PT Home Exercise Plan left breast MLD    Consulted and Agree with Plan of Care Patient             Patient will benefit from skilled therapeutic intervention in order to improve the following deficits and impairments:  Increased edema, Postural dysfunction, Pain, Decreased knowledge of precautions, Decreased activity tolerance, Impaired UE functional use, Increased fascial restricitons  Visit Diagnosis: Aftercare following surgery for neoplasm  Abnormal posture  Localized edema  Malignant neoplasm of lower-outer quadrant of left breast of female, estrogen receptor positive (H[facility])     Problem List Patient Active Problem List   Diagnosis Date Noted   Acid reflux 05/13/2021   Diabetes mellitus ([facility]) 04/21/2021   DDD (degenerative disc disease), cervical 03/06/2021   Genetic testing 10/04/2020   Family history of breast cancer    Family history of colon cancer    Family history of prostate cancer    Family history of stomach cancer    Malignant neoplasm  of lower-outer quadrant of left breast of female, estrogen receptor positive ([facility]) 09/22/2020   Anxiety 09/07/2015    Waynette Buttery, PT 06/07/2021, 12:04 PM  [facility] @ [place] [address] [place], [place], [address] Phone: [phone]  Fax:  [phone]  Name: Amy Stephenson MRN: [id number] Date of Birth: 1966-06-18

## 2021-06-08 ENCOUNTER — Other Ambulatory Visit: Payer: Self-pay | Admitting: Specialist

## 2021-06-08 ENCOUNTER — Ambulatory Visit: Payer: BC Managed Care – PPO

## 2021-06-08 DIAGNOSIS — M25552 Pain in left hip: Secondary | ICD-10-CM

## 2021-06-08 DIAGNOSIS — R600 Localized edema: Secondary | ICD-10-CM

## 2021-06-08 DIAGNOSIS — Z483 Aftercare following surgery for neoplasm: Secondary | ICD-10-CM

## 2021-06-08 DIAGNOSIS — R293 Abnormal posture: Secondary | ICD-10-CM

## 2021-06-08 NOTE — Therapy (Signed)
[facility] @ [place], [place], [address] Phone: [phone]   Fax:  [phone]  Physical Therapy Treatment  Patient Details  Name: Amy Stephenson MRN: [id number] Date of Birth: 1965/07/29 Referring Provider (PT): Dr. Donne Hazel   Encounter Date: 06/08/2021   PT End of Session - 06/08/21 1309     Visit Number 15    Date for PT Re-Evaluation 06/28/21    PT Start Time 1232    PT Stop Time 1311    PT Time Calculation (min) 39 min    Activity Tolerance Patient tolerated treatment well    Behavior During Therapy [facility] for tasks assessed/performed             Past Medical History:  Diagnosis Date   Diabetes mellitus without complication ([place])    Family history of breast cancer    Family history of colon cancer    Family history of prostate cancer    Family history of stomach cancer    Personal history of radiation therapy     Past Surgical History:  Procedure Laterality Date   BREAST LUMPECTOMY     BREAST LUMPECTOMY WITH RADIOACTIVE SEED AND SENTINEL LYMPH NODE BIOPSY Left 10/15/2020   Procedure: LEFT BREAST LUMPECTOMY WITH RADIOACTIVE SEED AND LEFT AXILLARY SENTINEL LYMPH NODE BIOPSY;  Surgeon: Rolm Bookbinder, MD;  Location: [facility];  Service: General;  Laterality: Left;  90 MINUTES ROOM 8   CHOLECYSTECTOMY     COLONOSCOPY      There were no vitals filed for this visit.   Subjective Assessment - 06/08/21 1235     Subjective I am feeling so much better.  Even though it is raining, I am not feeling too achy.  I am still 50% improved    Patient Stated Goals Be able to feel comfortable that I am doing the right things, decrease pain, get rid of strands    Currently in Pain? Yes    Pain Score 1     Pain Location Shoulder    Pain Orientation Left    Pain Descriptors / Indicators Aching;Dull    Pain Type Acute pain    Pain Onset More than a month ago    Pain Frequency Intermittent     Aggravating Factors  weather changes, over exertion    Pain Relieving Factors rest, stretching, manual therapy                               [facility] Adult PT Treatment/Exercise - 06/08/21 0001       Manual Therapy   Manual Therapy Soft tissue mobilization;Myofascial release    Manual therapy comments skilled palpation and monitoring for DN    Soft tissue mobilization soft tissue work in supine to pecs and lats and in  Rt S/L to lats and scapular area. scar mobilization to inferior breast incision              Trigger Point Dry Needling - 06/08/21 0001     Consent Given? Yes    Education Handout Provided Yes    Muscles Treated Upper Quadrant Rhomboids;Latissimus dorsi;Subscapularis;Infraspinatus;Deltoid   Lt only   Rhomboids Response Twitch response elicited;Palpable increased muscle length    Infraspinatus Response Twitch response elicited;Palpable increased muscle length    Subscapularis Response Twitch response elicited;Palpable increased muscle length    Deltoid Response Twitch response elicited;Palpable increased muscle length    Latissimus  dorsi Response Twitch response elicited;Palpable increased muscle length   along lateral ribs on Lt                       PT Long Term Goals - 05/31/21 1206       PT LONG TERM GOAL #1   Title Pt will be independent in Self left breast MLD    Baseline Pt is independent in this - 05/31/21    Status Achieved      PT LONG TERM GOAL #2   Title Pt will be fit for compression bra and will note decreased left breast discomfort while wearing    Status On-going      PT LONG TERM GOAL #3   Title Pt will have decreased left upper quarter discomfort by atleast 50%    Baseline Pt did not rate today but reports this has imrpoved, especially since starting DN - 05/31/21    Status Partially Met                   Plan - 06/08/21 1316     Clinical Impression Statement Pt is making good progress with  manual therapy to address trigger points with DN and tissue mobility.  Pt with multiple trigger points and good twitch response in all muscles with improved tissue mobility and reduced tension after manual therapy and DN today. Overall tissue mobility is improved with fewer active trigger points since the beginning of care,  and pt reports >50% overall reduced tension since her first needling session. Pt will continue to benefit from skilled PT to address tissue mobility of the Lt upper quarter.    PT Frequency 2x / week    PT Duration 4 weeks    PT Treatment/Interventions ADLs/Self Care Home Management;Therapeutic exercise;Therapeutic activities;Patient/family education;Manual techniques;Scar mobilization;Manual lymph drainage;Taping;Dry needling    PT Next Visit Plan DN as needed to address tissue mobility and trigger points, STM, MLD, scar mobs    Consulted and Agree with Plan of Care Patient             Patient will benefit from skilled therapeutic intervention in order to improve the following deficits and impairments:  Increased edema, Postural dysfunction, Pain, Decreased knowledge of precautions, Decreased activity tolerance, Impaired UE functional use, Increased fascial restricitons  Visit Diagnosis: Aftercare following surgery for neoplasm  Abnormal posture  Localized edema     Problem List Patient Active Problem List   Diagnosis Date Noted   Acid reflux 05/13/2021   Diabetes mellitus ([place]) 04/21/2021   DDD (degenerative disc disease), cervical 03/06/2021   Genetic testing 10/04/2020   Family history of breast cancer    Family history of colon cancer    Family history of prostate cancer    Family history of stomach cancer    Malignant neoplasm of lower-outer quadrant of left breast of female, estrogen receptor positive ([place]) 09/22/2020   Anxiety 09/07/2015    Sigurd Sos, PT 06/08/21 1:17 PM   [facility] @  [place], [place], [address] Phone: [phone]   Fax:  [phone]  Name: Amy Stephenson MRN: [id number] Date of Birth: 28-Jul-1965

## 2021-06-10 ENCOUNTER — Other Ambulatory Visit: Payer: Self-pay

## 2021-06-10 ENCOUNTER — Ambulatory Visit
Admission: RE | Admit: 2021-06-10 | Discharge: 2021-06-10 | Disposition: A | Discharge status: Home / Self Care | Payer: BC Managed Care – PPO | Source: Ambulatory Visit | Attending: Specialist | Admitting: Specialist

## 2021-06-10 DIAGNOSIS — M25552 Pain in left hip: Secondary | ICD-10-CM

## 2021-06-10 IMAGING — MR MR HIP*L* W/O CM
6 series · 37 of 40 positions shown · non-contrast
Comparison: Whole-body bone scan [DATE]. Bilateral hip
radiographs [DATE].

CLINICAL DATA: Left hip pain for 6 months. History of breast cancer
with radiation therapy.

EXAM:
MR OF THE LEFT HIP WITHOUT CONTRAST
TECHNIQUE: Multiplanar, multisequence MR imaging was performed. No intravenous
contrast was administered.

[Series 3: T1 · coronal · 4.0mm · 1.19mm/px · 3 of 24 slices shown]
[im 1/24]
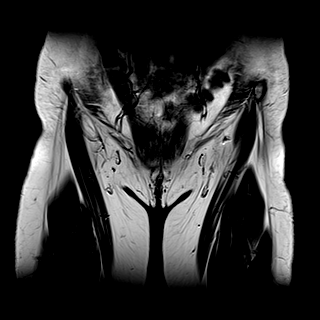
[im 5/24]
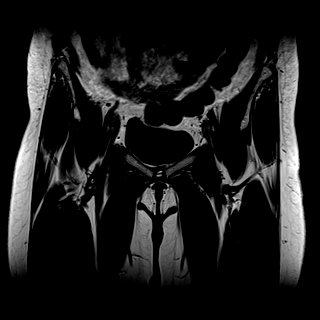
[im 10/24]
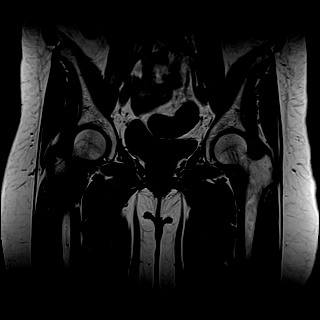

[Series 4: T2 fat-sat · coronal · 4.0mm · 1.19mm/px · 7 of 24 slices shown (1 of 2)]
[im 1/24]
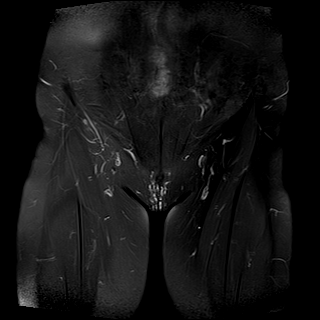
[im 4/24]
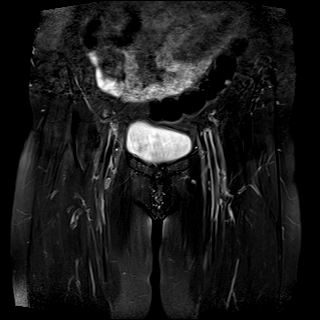
[im 8/24]
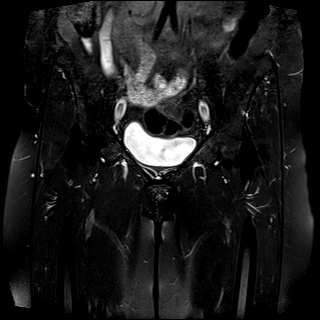
[im 12/24]
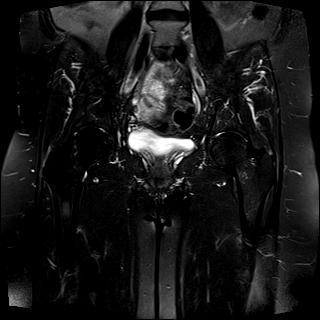
[im 16/24]
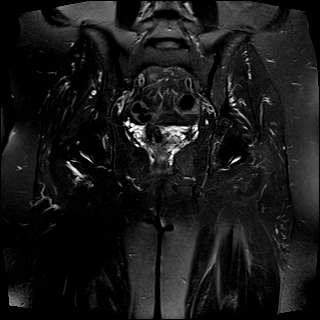
[im 20/24]
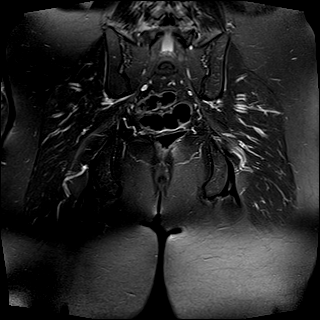
[im 24/24]
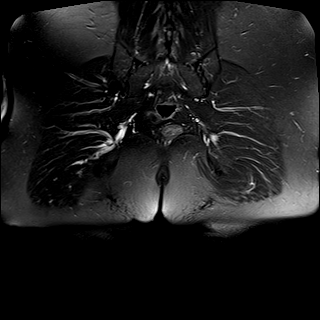

[Series 5: T2 fat-sat · axial · 4.0mm · 0.62mm/px · z∈[-59,+75]mm · 8 of 29 slices shown (2 of 2)]
[im 1/29]
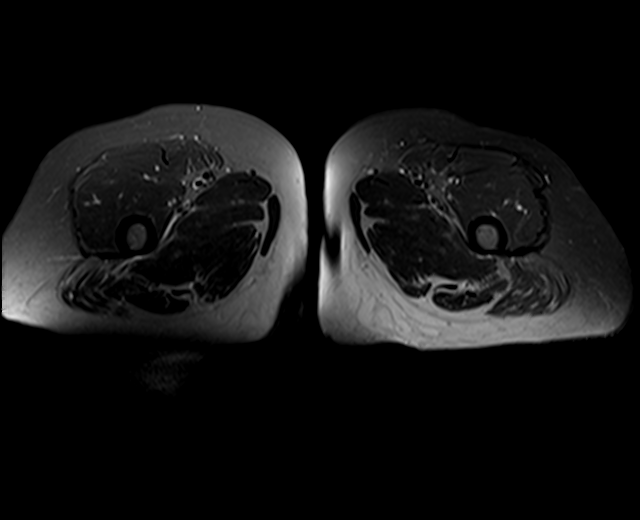
[im 5/29]
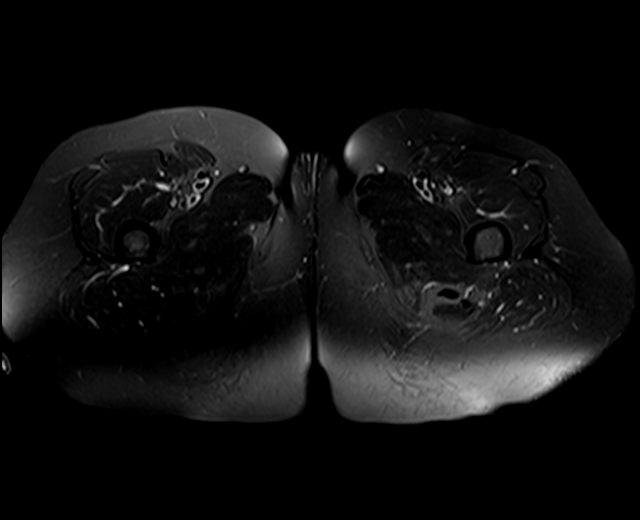
[im 9/29]
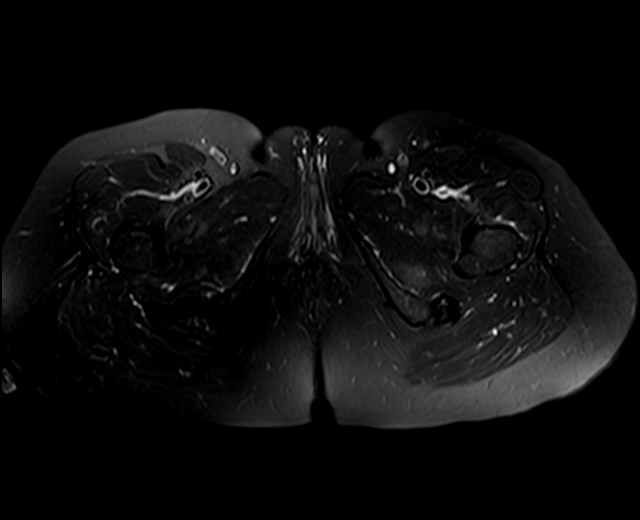
[im 13/29]
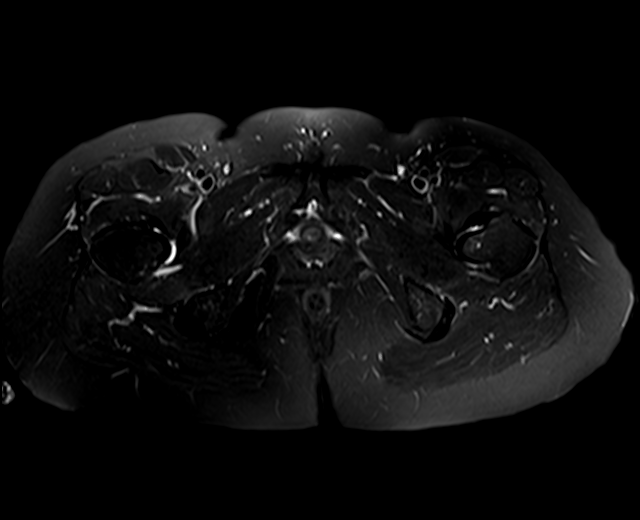
[im 17/29]
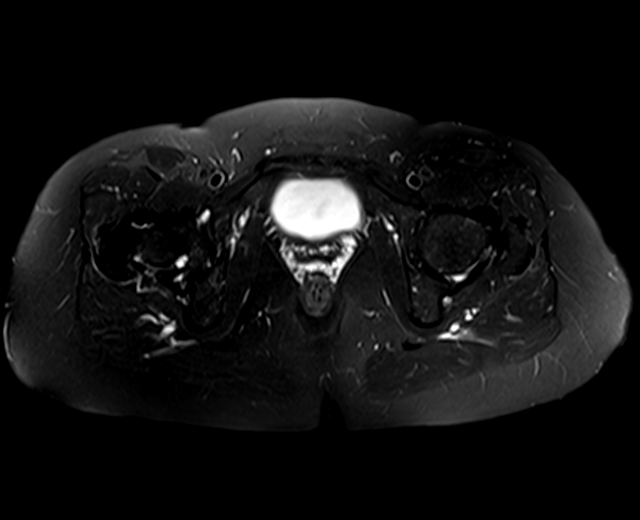
[im 21/29]
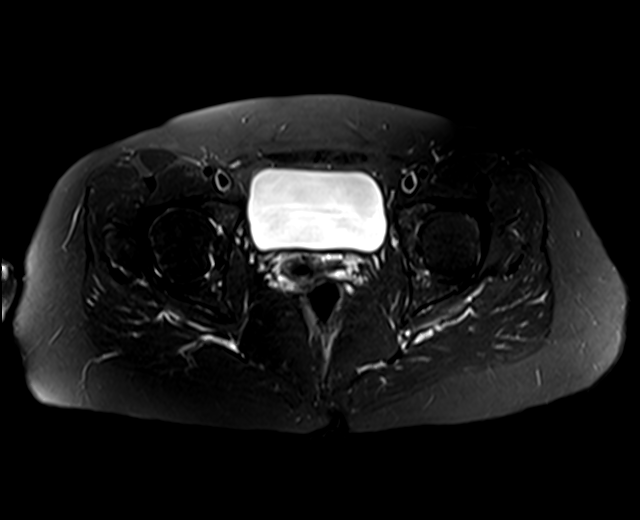
[im 25/29]
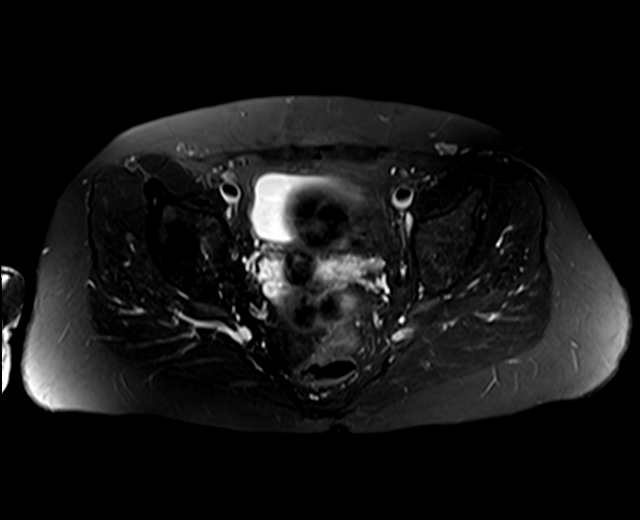
[im 29/29]
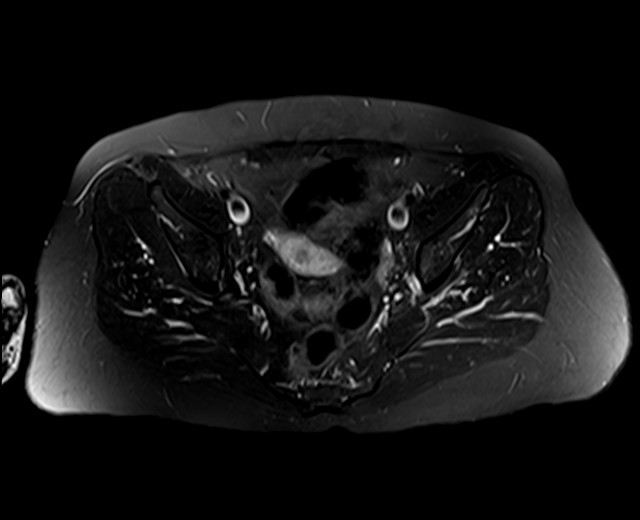

[Series 6: PD fat-sat · sagittal · 4.0mm · 0.70mm/px · 7 of 24 slices shown (1 of 3)]
[im 1/24]
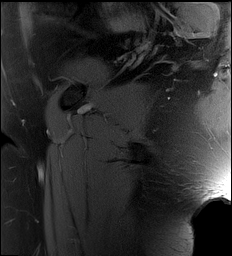
[im 4/24]
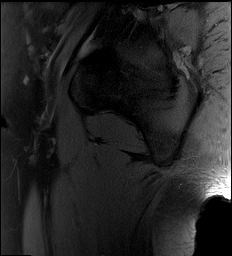
[im 8/24]
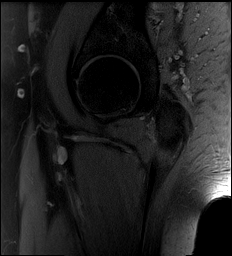
[im 12/24]
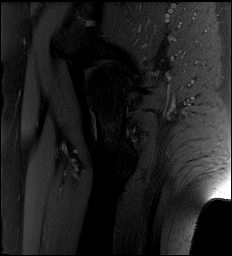
[im 16/24]
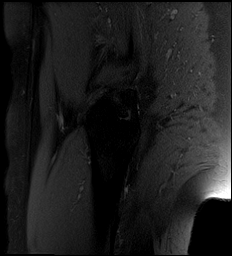
[im 20/24]
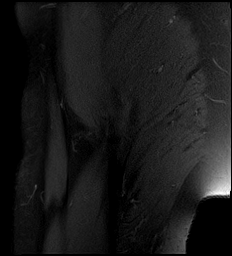
[im 24/24]
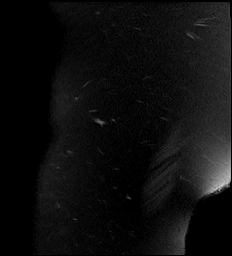

[Series 7: PD fat-sat · coronal · 4.0mm · 0.70mm/px · 5 of 19 slices shown (2 of 3)]
[im 1/19]
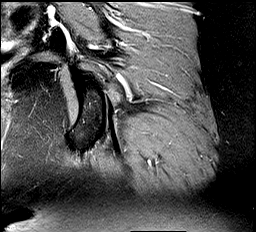
[im 5/19]
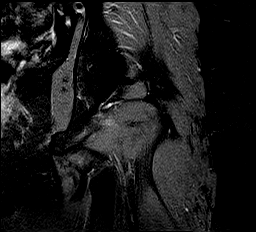
[im 10/19]
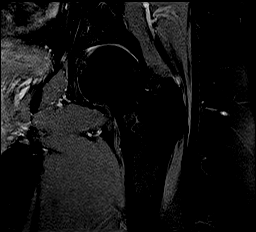
[im 14/19]
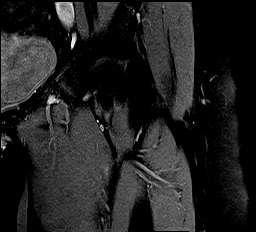
[im 19/19]
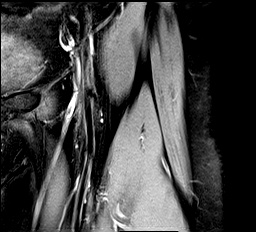

[Series 8: PD fat-sat · axial · 4.0mm · 0.70mm/px · z∈[+14,+113]mm · 7 of 24 slices shown (3 of 3)]
[im 1/24]
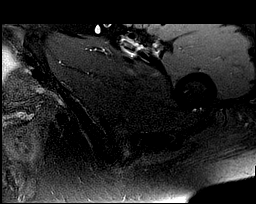
[im 4/24]
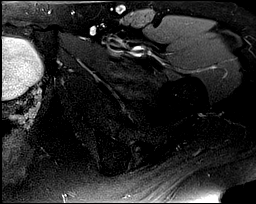
[im 8/24]
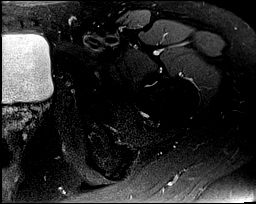
[im 12/24]
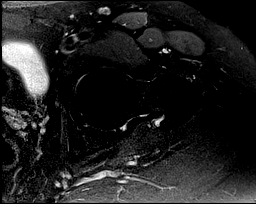
[im 16/24]
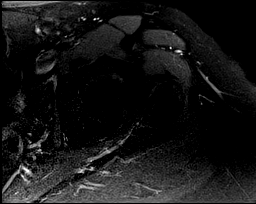
[im 20/24]
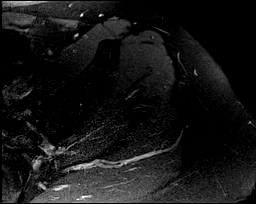
[im 24/24]
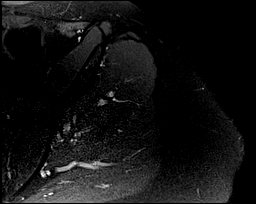

[37 of 40 positions shown; findings below may reference images not displayed]

FINDINGS: Bones: There is no evidence of acute fracture, dislocation or
avascular necrosis. There is no evidence of osseous metastatic
disease. The visualized sacroiliac joints and symphysis pubis appear
normal.

Articular cartilage and labrum

Articular cartilage: No focal chondral defect or subchondral signal
abnormality identified.

Labrum: There is no gross labral tear or paralabral abnormality.

Joint or bursal effusion

Joint effusion: No significant hip joint effusion.

Bursae: No focal periarticular fluid collection.

Muscles and tendons

Muscles and tendons: Minimal right gluteus and left common hamstring
tendinosis without tear. The iliopsoas tendons appear intact. No
focal muscular abnormalities. The piriformis muscles are symmetric.

Other findings

Miscellaneous: The visualized internal pelvic contents appear
unremarkable.
IMPRESSION: No acute findings or explanation for the patient's symptoms. No
evidence of metastatic disease or significant arthropathy. Minimal
left common hamstring and right gluteus tendinosis without tear.

## 2021-06-16 ENCOUNTER — Encounter: Payer: BC Managed Care – PPO | Admitting: Physical Therapy

## 2021-06-21 ENCOUNTER — Other Ambulatory Visit: Payer: Self-pay

## 2021-06-21 ENCOUNTER — Ambulatory Visit: Payer: BC Managed Care – PPO

## 2021-06-21 DIAGNOSIS — R293 Abnormal posture: Secondary | ICD-10-CM

## 2021-06-21 DIAGNOSIS — Z483 Aftercare following surgery for neoplasm: Secondary | ICD-10-CM | POA: Diagnosis not present

## 2021-06-21 DIAGNOSIS — R600 Localized edema: Secondary | ICD-10-CM

## 2021-06-21 DIAGNOSIS — C50512 Malignant neoplasm of lower-outer quadrant of left female breast: Secondary | ICD-10-CM

## 2021-06-21 NOTE — Therapy (Signed)
[facility] @ [place], [place], [address] Phone: [phone]   Fax:  [phone]  Physical Therapy Treatment  Patient Details  Name: Amy Stephenson MRN: [id number] Date of Birth: [birth year] Referring Provider (PT): Dr. Donne Hazel   Encounter Date: 06/21/2021   PT End of Session - 06/21/21 1014     Visit Number 16    Number of Visits 28    Date for PT Re-Evaluation 08/02/21    PT Start Time 1005    PT Stop Time 1058    PT Time Calculation (min) 53 min    Activity Tolerance Patient tolerated treatment well    Behavior During Therapy [facility] for tasks assessed/performed             Past Medical History:  Diagnosis Date   Diabetes mellitus without complication ([place])    Family history of breast cancer    Family history of colon cancer    Family history of prostate cancer    Family history of stomach cancer    Personal history of radiation therapy     Past Surgical History:  Procedure Laterality Date   BREAST LUMPECTOMY     BREAST LUMPECTOMY WITH RADIOACTIVE SEED AND SENTINEL LYMPH NODE BIOPSY Left 10/15/2020   Procedure: LEFT BREAST LUMPECTOMY WITH RADIOACTIVE SEED AND LEFT AXILLARY SENTINEL LYMPH NODE BIOPSY;  Surgeon: Rolm Bookbinder, MD;  Location: [facility];  Service: General;  Laterality: Left;  90 MINUTES ROOM 8   CHOLECYSTECTOMY     COLONOSCOPY      There were no vitals filed for this visit.   Subjective Assessment - 06/21/21 1005     Subjective Doing well overall, especially through the holidays, but yesterday was a bad day.  My breast was sore. I did the MLD twice and it did give me some relief. I have been keeping up with my exercises. Overall discomfort/pain is 50% better, but I continue to have flare ups.   Pertinent History left breast cancer found on mammogram 08/2020   She underwent Left Lumpectomy with SLNB on 10/15/2020 and was diagnosed with Gr. 2 IDC ER+, PR+, Her 2 -  with Ki67 5%.She  had radiation from 11/17/2020 - 12/15/2020.  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  presently being treated for sciatic pain and nerve pain in arm.  She has a small left shoulder RTC tear    Patient Stated Goals Be able to feel comfortable that I am doing the right things, decrease pain, get rid of strands    Currently in Pain? Yes    Pain Score 1     Pain Location Breast    Pain Orientation Left    Pain Descriptors / Indicators Aching    Pain Type Surgical pain    Pain Onset More than a month ago    Pain Frequency Intermittent                               [facility] Adult PT Treatment/Exercise - 06/21/21 0001       Manual Therapy   Manual Therapy Soft tissue mobilization;Manual Lymphatic Drainage (MLD)    Soft tissue mobilization soft tissue work in supine to pecs and lats and in  Rt S/L to  lats, serratus and scapular area. scar mobilization to inferior breast incision    Manual Lymphatic Drainage (MLD) in supine. to left breast:  short neck, 5 diaphragmatic breaths, right axillary LN's, anterior interaxillary pathway, left inginal nodes and establishment of L axillo-inguinal pathway and left breast directing to pathways and retracing all steps and ending with LN's                          PT Long Term Goals - 06/21/21 1053       PT LONG TERM GOAL #1   Title Pt will be independent in Self left breast MLD    Time 6    Period Weeks    Status Achieved      PT LONG TERM GOAL #2   Title Pt will be fit for compression bra and will note decreased left breast discomfort while wearing    Time 6    Period Weeks    Status Achieved      PT LONG TERM GOAL #3   Title Pt will have decreased left upper quarter discomfort by atleast 50%    Period Weeks    Status Achieved      PT LONG TERM GOAL #4    Title Pt. will be independent in self management of left breast pain and swelling    Time 6    Period Weeks    Status New    Target Date 08/02/21      PT LONG TERM GOAL #5   Title pt will have decreased left breast, upper quarter discomfort/pain and swelling by 75%    Time 6    Period Weeks    Status New    Target Date 08/02/21                   Plan - 06/21/21 1051     Clinical Impression Statement Pt does well for a period of time and then experiences flares of pain.  There were multiple tender points today in left lats /serratus and scapular area.  She has achieved 50% improvement in pain but would benefit from continued PT 1-2 times per week prn for up to 6 weeks to be independent in self management and to get her through periods of exacerbation    Personal Factors and Comorbidities Comorbidity 2    Comorbidities left breast cancer, DM    Examination-Activity Limitations Lift;Reach Overhead    Stability/Clinical Decision Making Stable/Uncomplicated    Rehab Potential Excellent    PT Frequency 2x / week , decreasing to 1x /week as able   PT Duration 6 weeks    PT Treatment/Interventions ADLs/Self Care Home Management;Therapeutic exercise;Therapeutic activities;Patient/family education;Manual techniques;Scar mobilization;Manual lymph drainage;Taping;Dry needling    PT Next Visit Plan DN as needed to address tissue mobility and trigger points, STM, MLD, scar mobs    PT Home Exercise Plan left breast MLD, theraband exs    Consulted and Agree with Plan of Care Patient             Patient will benefit from skilled therapeutic intervention in order to improve the following deficits and impairments:  Increased edema, Postural dysfunction, Pain, Decreased knowledge of precautions, Decreased activity tolerance, Impaired UE functional use, Increased fascial restricitons  Visit Diagnosis: Aftercare following surgery for neoplasm  Abnormal posture  Localized  edema  Malignant neoplasm of lower-outer quadrant of left breast of female, estrogen receptor positive ([place])     Problem List Patient  Active Problem List   Diagnosis Date Noted   Acid reflux 05/13/2021   Diabetes mellitus ([place]) 04/21/2021   DDD (degenerative disc disease), cervical 03/06/2021   Genetic testing 10/04/2020   Family history of breast cancer    Family history of colon cancer    Family history of prostate cancer    Family history of stomach cancer    Malignant neoplasm of lower-outer quadrant of left breast of female, estrogen receptor positive ([place]) 09/22/2020   Anxiety 09/07/2015    [name], PT 06/21/2021, 11:05 AM  [facility] @ [place], [place], [address] Phone: [phone]   Fax:  [phone]  Name: Amy Stephenson MRN: [id number] Date of Birth: Jun 30, 1965

## 2021-06-22 ENCOUNTER — Ambulatory Visit: Payer: BC Managed Care – PPO

## 2021-06-22 DIAGNOSIS — Z483 Aftercare following surgery for neoplasm: Secondary | ICD-10-CM | POA: Diagnosis not present

## 2021-06-22 NOTE — Patient Instructions (Signed)
Access Code: [email or web address] URL: [email or web address] Date: 06/22/2021 Prepared by: Claiborne Billings  Exercises Cat-Camel - 1 x daily - 7 x weekly - 1 sets - 10 reps - 5 hold Child's Pose Stretch - 1 x daily - 7 x weekly - 3 sets - 10 reps Child's Pose with Sidebending - 1 x daily - 7 x weekly - 3 sets - 10 reps

## 2021-06-22 NOTE — Therapy (Signed)
[facility] @ [facility] [address], [place], [address] Phone: [phone]   Fax:  [phone]  Physical Therapy Treatment  Patient Details  Name: BELLADONNA LUBINSKI MRN: [id number] Date of Birth: 03-28-1966 Referring Provider (PT): Dr. Donne Hazel   Encounter Date: 06/22/2021   PT End of Session - 06/22/21 1521     Visit Number 17    Date for PT Re-Evaluation 08/02/21    PT Start Time 1446    PT Stop Time 1522    PT Time Calculation (min) 36 min    Activity Tolerance Patient tolerated treatment well    Behavior During Therapy [facility] for tasks assessed/performed             Past Medical History:  Diagnosis Date   Diabetes mellitus without complication ([place])    Family history of breast cancer    Family history of colon cancer    Family history of prostate cancer    Family history of stomach cancer    Personal history of radiation therapy     Past Surgical History:  Procedure Laterality Date   BREAST LUMPECTOMY     BREAST LUMPECTOMY WITH RADIOACTIVE SEED AND SENTINEL LYMPH NODE BIOPSY Left 10/15/2020   Procedure: LEFT BREAST LUMPECTOMY WITH RADIOACTIVE SEED AND LEFT AXILLARY SENTINEL LYMPH NODE BIOPSY;  Surgeon: Rolm Bookbinder, MD;  Location: [facility];  Service: General;  Laterality: Left;  90 MINUTES ROOM 8   CHOLECYSTECTOMY     COLONOSCOPY      There were no vitals filed for this visit.   Subjective Assessment - 06/22/21 1446     Subjective I had a couple of bad days with muscle tension and trigger points.  I am feeling a little bit better now.  I think that the trigger points have returned.    Pertinent History left breast cancer found on mammogram 08/2020   She underwent Left Lumpectomy with SLNB on 10/15/2020 and was diagnosed with Gr. 2 IDC ER+, PR+, Her 2 - with Ki67 5%.She  had radiation from 11/17/2020 - 12/15/2020.  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  presently being treated for sciatic pain and nerve pain in arm.  She has a small left shoulder RTC tear    Patient Stated Goals Be able to feel comfortable that I am doing the right things, decrease pain, get rid of strands    Currently in Pain? Yes    Pain Score 2     Pain Location Shoulder    Pain Orientation Left    Pain Descriptors / Indicators Aching;Tightness;Spasm    Pain Type Chronic pain    Pain Onset More than a month ago    Pain Frequency Intermittent    Aggravating Factors  sometimes random    Pain Relieving Factors rest, stretching, manual therapy, DN                               [facility] Adult PT Treatment/Exercise - 06/22/21 0001       Exercises   Exercises Lumbar      Lumbar Exercises: Quadruped   Madcat/Old Horse 10 reps    Other Quadruped Lumbar Exercises childs pose and lateral childs pose      Manual Therapy  Manual Therapy Soft tissue mobilization;Manual Lymphatic Drainage (MLD)    Manual therapy comments skilled palpation and monitoring for DN    Soft tissue mobilization Lt upper traps, rhomboids and lats              Trigger Point Dry Needling - 06/22/21 0001     Consent Given? Yes    Education Handout Provided Previously provided    Muscles Treated Upper Quadrant Rhomboids;Latissimus dorsi;Subscapularis;Infraspinatus;Deltoid   Lt only   Rhomboids Response Twitch response elicited;Palpable increased muscle length    Infraspinatus Response Twitch response elicited;Palpable increased muscle length    Subscapularis Response Twitch response elicited;Palpable increased muscle length    Latissimus dorsi Response Twitch response elicited;Palpable increased muscle length   along lateral ribs on Lt                  PT Education - 06/22/21 1517     Education Details Access Code: [email or web address]    Person(s) Educated Patient     Methods Explanation;Demonstration;Handout    Comprehension Verbalized understanding;Returned demonstration                 PT Long Term Goals - 06/21/21 1053       PT LONG TERM GOAL #1   Title Pt will be independent in Self left breast MLD    Time 6    Period Weeks    Status Achieved      PT LONG TERM GOAL #2   Title Pt will be fit for compression bra and will note decreased left breast discomfort while wearing    Time 6    Period Weeks    Status Achieved      PT LONG TERM GOAL #3   Title Pt will have decreased left upper quarter discomfort by atleast 50%    Period Weeks    Status Achieved      PT LONG TERM GOAL #4   Title Pt. will be independent in self management of left breast pain and swelling    Time 6    Period Weeks    Status New    Target Date 08/02/21      PT LONG TERM GOAL #5   Title pt will have decreased left breast discomfort/pain and swelling by 75%    Time 6    Period Weeks    Status New    Target Date 08/02/21                   Plan - 06/22/21 1525     Clinical Impression Statement Pt is making good progress with manual therapy to address trigger points with DN and tissue mobility.  Pt with multiple trigger points and good twitch response in all muscles with improved tissue mobility and reduced tension after manual therapy and DN today. Overall tissue mobility is improved with fewer active trigger points since the beginning of care, although increased today vs last visit due to flare-up over the past few days.  Pt reports >50% overall reduced tension since her first needling session. Pt will continue to benefit from skilled PT to address tissue mobility of the Lt upper quarter.    PT Frequency 2x / week    PT Duration 4 weeks    PT Treatment/Interventions ADLs/Self Care Home Management;Therapeutic exercise;Therapeutic activities;Patient/family education;Manual techniques;Scar mobilization;Manual lymph drainage;Taping;Dry needling    PT  Next Visit Plan DN as needed to address tissue mobility and trigger points, STM, MLD, scar mobs    PT  Home Exercise Plan left breast MLD, theraband exs    Consulted and Agree with Plan of Care Patient             Patient will benefit from skilled therapeutic intervention in order to improve the following deficits and impairments:  Increased edema, Postural dysfunction, Pain, Decreased knowledge of precautions, Decreased activity tolerance, Impaired UE functional use, Increased fascial restricitons  Visit Diagnosis: Abnormal posture  Aftercare following surgery for neoplasm  Localized edema     Problem List Patient Active Problem List   Diagnosis Date Noted   Acid reflux 05/13/2021   Diabetes mellitus ([place]) 04/21/2021   DDD (degenerative disc disease), cervical 03/06/2021   Genetic testing 10/04/2020   Family history of breast cancer    Family history of colon cancer    Family history of prostate cancer    Family history of stomach cancer    Malignant neoplasm of lower-outer quadrant of left breast of female, estrogen receptor positive ([place]) 09/22/2020   Anxiety 09/07/2015   Sigurd Sos, PT 06/22/21 3:26 PM   [facility] @ [facility] [place] [place], [place], [address] Phone: [phone]   Fax:  [phone]  Name: BROOK GERACI MRN: [id number] Date of Birth: [birth year]

## 2021-07-19 ENCOUNTER — Other Ambulatory Visit: Payer: Self-pay

## 2021-07-19 ENCOUNTER — Telehealth: Payer: Self-pay

## 2021-07-19 NOTE — Telephone Encounter (Signed)
Returned call to pt, pt says she was told by her orthopaedist to ask oncology for a PET scan.  Pt states she will pay even if insurance will not cover this.  I asked pt for further details, she states ever since her lumpectomy she has had intense pain, breast, pelvis, and thighs.  She continues to do her PT exercises at home but says that the pain wakes her up at night and is effecting her quality of life and she is very frustrated.  I advised pt to come in to be seen by MD so we could try to submit authorization with insurance.  Pt verbalized understanding, agreement, and thanks.  Appointment scheduled during phone call.

## 2021-07-25 ENCOUNTER — Other Ambulatory Visit: Payer: Self-pay | Admitting: *Deleted

## 2021-07-25 DIAGNOSIS — Z170 Estrogen receptor positive status [ER+]: Secondary | ICD-10-CM

## 2021-07-25 NOTE — Progress Notes (Signed)
Patient Care Team: Annamaria Helling, MD as PCP - General (Obstetrics and Gynecology) Serena Croissant, MD as Consulting Physician (Hematology and Oncology) Lonie Peak, MD as Attending Physician (Radiation Oncology) [name], MD as Consulting Physician (General Surgery)  DIAGNOSIS:    ICD-10-CM   1. Malignant neoplasm of lower-outer quadrant of left breast of female, estrogen receptor positive (HCC)  C50.512    Z17.0       SUMMARY OF ONCOLOGIC HISTORY: Oncology History  Malignant neoplasm of lower-outer quadrant of left breast of female, estrogen receptor positive (HCC)  09/10/2020 Initial Diagnosis   Screening mammogram showed a possible left breast mass, palpable on exam. Diagnostic mammogram and US showed a 1.2cm mass at the 5 o'clock position and no abnormal axillary lymph nodes. Biopsy showed invasive mammary carcinoma, grade 1-2, HER-2 equivocal by IHC (2+), negative by FISH (ratio 1.36), ER+>95%, PR+ 90%, Ki67 5%. B   09/10/2020 Cancer Staging   Staging form: Breast, AJCC 8th Edition - Clinical stage from 09/10/2020: Stage IB (cT2, cN0, cM0, G2, ER+, PR+, HER2-) - Signed by Loa Socks, NP on 09/22/2020 Stage prefix: Initial diagnosis Histologic grading system: 3 grade system    09/16/2020 Breast MRI   0.6cm mass in the upper outer quadrant, non-mass enhancement in the low outer quadrant, and the known malignancy spanning 2.1cm in the left breast with no evidence of right breast malignancy.    Genetic Testing   Negative genetic testing. No pathogenic variants identified on the [facility] STAT+Multi-Cancer Panel. The report date is 10/04/2020.  The STAT Breast cancer panel offered by [facility] includes sequencing and rearrangement analysis for the following 9 genes:  ATM, BRCA1, BRCA2, CDH1, CHEK2, PALB2, PTEN, STK11 and TP53.    The Multi-Cancer Panel + RNA offered by [facility] includes sequencing and/or deletion duplication testing of the following 84 genes: AIP,  ALK, APC, ATM, AXIN2,BAP1,  BARD1, BLM, BMPR1A, BRCA1, BRCA2, BRIP1, CASR, CDC73, CDH1, CDK4, CDKN1B, CDKN1C, CDKN2A (p14ARF), CDKN2A (p16INK4a), CEBPA, CHEK2, CTNNA1, DICER1, DIS3L2, EGFR (c.2369C>T, p.Thr790Met variant only), EPCAM (Deletion/duplication testing only), FH, FLCN, GATA2, GPC3, GREM1 (Promoter region deletion/duplication testing only), HOXB13 (c.251G>A, p.Gly84Glu), HRAS, KIT, MAX, MEN1, MET, MITF (c.952G>A, p.Glu318Lys variant only), MLH1, MSH2, MSH3, MSH6, MUTYH, NBN, NF1, NF2, NTHL1, PALB2, PDGFRA, PHOX2B, PMS2, POLD1, POLE, POT1, PRKAR1A, PTCH1, PTEN, RAD50, RAD51C, RAD51D, RB1, RECQL4, RET, RUNX1, SDHAF2, SDHA (sequence changes only), SDHB, SDHC, SDHD, SMAD4, SMARCA4, SMARCB1, SMARCE1, STK11, SUFU, TERC, TERT, TMEM127, TP53, TSC1, TSC2, VHL, WRN and WT1.   10/15/2020 Surgery   Left breast lumpectomy: IDC, grade 2, margins neg, 1 SLN neg; T1c, N0, M0, g2, ER+/PR+/HER-2-   10/15/2020 Oncotype testing   Score 16, ROR 4%   11/04/2020 Cancer Staging   Staging form: Breast, AJCC 8th Edition - Pathologic: Stage IA (pT1c, pN0, cM0, G2, ER+, PR+, HER2-) - Signed by Serena Croissant, MD on 11/04/2020 Stage prefix: Initial diagnosis Histologic grading system: 3 grade system    11/17/2020 - 12/15/2020 Radiation Therapy   Adjuvant radiation therapy Site Technique Total Dose (Gy) Dose per Fx (Gy) Completed Fx Beam Energies  Breast, Left: Breast_Lt 3D 40.05/40.05 2.67 15/15 6XFFF  Breast, Left: Breast_Lt_Bst 3D 10/10 2 5/5 6X    03/17/2021 -  Anti-estrogen oral therapy   Declined due to extensive musculoskeletal aches and neuropathic pains     CHIEF COMPLIANT: Multiple sites of pain throughout her body  INTERVAL HISTORY: Amy Stephenson is a 56 y.o. with above-mentioned history of left breast cancer. She presents to the clinic today  for follow-up to discuss her extensive pain issues.  She has a pain that radiates from the groin area down to the lower part of the leg.  This is on the left side.   Previous MRIs of the hip and the spine showed abnormalities but did not show evidence of cause of her pain.  It is so excruciating that she is not able to sleep enjoy her life.  Previous scans have shown abnormalities in the bones that were concerning for metastatic disease.  Therefore today we are discussing about obtaining a PET CT scan for further evaluation of those abnormalities.  She also has pains in her shoulders and her hips.  ALLERGIES:  is allergic to penicillins, augmentin [amoxicillin-pot clavulanate], and sulfa antibiotics.  MEDICATIONS:  Current Outpatient Medications  Medication Sig Dispense Refill   ALPRAZolam (XANAX) 0.25 MG tablet Take 0.25 mg by mouth as needed.     escitalopram (LEXAPRO) 10 MG tablet Take 1 tablet (10 mg total) by mouth daily. Needs ov. Thanks. 90 tablet 0   gabapentin (NEURONTIN) 300 MG capsule Take 1 capsule (300 mg total) by mouth at bedtime. (Patient not taking: Reported on 04/15/2021) 90 capsule 3   glipiZIDE (GLUCOTROL XL) 5 MG 24 hr tablet Take 5 mg by mouth daily with breakfast.     MULTIPLE VITAMIN PO Take 0.5 tablets by mouth.      SitaGLIPtin-MetFORMIN HCl [phone] MG TB24 Janumet XR 100 mg-1,000 mg tablet,extended release     zolpidem (AMBIEN) 10 MG tablet zolpidem 10 mg tablet     No current facility-administered medications for this visit.    PHYSICAL EXAMINATION: ECOG PERFORMANCE STATUS: 1 - Symptomatic but completely ambulatory  Vitals:   07/26/21 0936  BP: 125/84  Pulse: 87  Resp: 18  Temp: 97.7 F (36.5 C)  SpO2: 100%   Filed Weights   07/26/21 0936  Weight: 153 lb 1.6 oz (69.4 kg)      LABORATORY DATA:  I have reviewed the data as listed CMP Latest Ref Rng & Units 10/06/2020  Glucose 70 - 99 mg/dL 150(H)  BUN 6 - 20 mg/dL 18  Creatinine 0.44 - 1.00 mg/dL 0.66  Sodium 135 - 145 mmol/L 138  Potassium 3.5 - 5.1 mmol/L 4.0  Chloride 98 - 111 mmol/L 100  CO2 22 - 32 mmol/L 30  Calcium 8.9 - 10.3 mg/dL 9.7    Lab  Results  Component Value Date   WBC 5.4 07/26/2021   HGB 12.6 07/26/2021   HCT 38.4 07/26/2021   MCV 90.1 07/26/2021   PLT 281 07/26/2021   NEUTROABS 2.9 07/26/2021    ASSESSMENT & PLAN:  Malignant neoplasm of lower-outer quadrant of left breast of female, estrogen receptor positive ([place]) 09/10/2020: Screening mammogram detected left breast mass later palpable, 1.2 cm at 5 o'clock position, axilla negative, biopsy: Grade 1-2 IDC, ER greater than 95%, PR 90%, Ki-67 5%, HER-2 negative ratio 1.36   09/16/2020: Breast MRI: 0.6 cm mass UOQ, non-mass enhancement LOQ spanning 2.1 cm, MRI guided biopsy is recommended for the indeterminate mass in the non-mass enhancement. 10/15/20: Left Lumpectomy: 1.9 cm Grade 2 IDC , Margins Neg, ER greater than 95%, PR 90%, Ki-67 5%, HER-2 negative ratio 1.36   Oncotype Dx: Score 16, ROR 4% Adj XRT 11/18/20- 12/15/20   Bone density: -2.7: Osteoporosis   Surveillance:  1.  Breast exam 03/18/2019: Benign 2. mammogram and ultrasound 04/04/2021: Benign breast density category C 3. Breast MRI: 04/27/2021: Benign breast density category C  breast  MRI every 2 to 3 years as needed. 4.  [facility] lab test for minimal residual disease: Negative.  Diffuse pains: Especially in the left hip radiating down the leg, pain in the chest pain in the back pain in the hips Based upon the previous bone scan done on 05/25/2021, I recommend that we obtain a PET CT scan for further evaluation of her bones.  She has tried every alternative/complementary treatment that she can identify to alleviate the symptoms of this pain.  She has even been through several months of physical therapy without any benefit.  I encouraged her to take 5000 international units of B12 sublingually daily. I will call her with the results of the scans.      No orders of the defined types were placed in this encounter.  The patient has a good understanding of the overall plan. she agrees with it. she will  call with any problems that may develop before the next visit here.  Total time spent: 30 mins including face to face time and time spent for planning, charting and coordination of care  Rulon Eisenmenger, MD, MPH 07/26/2021  I, Thana Ates, am acting as scribe for Dr. Nicholas Lose.  I have reviewed the above documentation for accuracy and completeness, and I agree with the above.

## 2021-07-26 ENCOUNTER — Inpatient Hospital Stay: Payer: BC Managed Care – PPO | Attending: Genetic Counselor | Admitting: Hematology and Oncology

## 2021-07-26 ENCOUNTER — Other Ambulatory Visit: Payer: Self-pay

## 2021-07-26 ENCOUNTER — Inpatient Hospital Stay: Payer: BC Managed Care – PPO

## 2021-07-26 DIAGNOSIS — N6321 Unspecified lump in the left breast, upper outer quadrant: Secondary | ICD-10-CM | POA: Insufficient documentation

## 2021-07-26 DIAGNOSIS — M25551 Pain in right hip: Secondary | ICD-10-CM | POA: Insufficient documentation

## 2021-07-26 DIAGNOSIS — Z170 Estrogen receptor positive status [ER+]: Secondary | ICD-10-CM

## 2021-07-26 DIAGNOSIS — Z79899 Other long term (current) drug therapy: Secondary | ICD-10-CM | POA: Diagnosis not present

## 2021-07-26 DIAGNOSIS — Z881 Allergy status to other antibiotic agents status: Secondary | ICD-10-CM | POA: Diagnosis not present

## 2021-07-26 DIAGNOSIS — Z880 Allergy status to penicillin: Secondary | ICD-10-CM | POA: Insufficient documentation

## 2021-07-26 DIAGNOSIS — C50512 Malignant neoplasm of lower-outer quadrant of left female breast: Secondary | ICD-10-CM

## 2021-07-26 DIAGNOSIS — M810 Age-related osteoporosis without current pathological fracture: Secondary | ICD-10-CM | POA: Insufficient documentation

## 2021-07-26 DIAGNOSIS — M25552 Pain in left hip: Secondary | ICD-10-CM | POA: Insufficient documentation

## 2021-07-26 DIAGNOSIS — Z882 Allergy status to sulfonamides status: Secondary | ICD-10-CM | POA: Insufficient documentation

## 2021-07-26 DIAGNOSIS — M25519 Pain in unspecified shoulder: Secondary | ICD-10-CM | POA: Diagnosis not present

## 2021-07-26 LAB — CBC WITH DIFFERENTIAL (CANCER CENTER ONLY)
Abs Immature Granulocytes: 0.02 10*3/uL (ref 0.00–0.07)
Basophils Absolute: 0 10*3/uL (ref 0.0–0.1)
Basophils Relative: 1 %
Eosinophils Absolute: 0.1 10*3/uL (ref 0.0–0.5)
Eosinophils Relative: 2 %
HCT: 38.4 % (ref 36.0–46.0)
Hemoglobin: 12.6 g/dL (ref 12.0–15.0)
Immature Granulocytes: 0 %
Lymphocytes Relative: 35 %
Lymphs Abs: 1.9 10*3/uL (ref 0.7–4.0)
MCH: 29.6 pg (ref 26.0–34.0)
MCHC: 32.8 g/dL (ref 30.0–36.0)
MCV: 90.1 fL (ref 80.0–100.0)
Monocytes Absolute: 0.4 10*3/uL (ref 0.1–1.0)
Monocytes Relative: 7 %
Neutro Abs: 2.9 10*3/uL (ref 1.7–7.7)
Neutrophils Relative %: 55 %
Platelet Count: 281 10*3/uL (ref 150–400)
RBC: 4.26 MIL/uL (ref 3.87–5.11)
RDW: 13.2 % (ref 11.5–15.5)
WBC Count: 5.4 10*3/uL (ref 4.0–10.5)
nRBC: 0 % (ref 0.0–0.2)

## 2021-07-26 LAB — CMP (CANCER CENTER ONLY)
ALT: 15 U/L (ref 0–44)
AST: 14 U/L — ABNORMAL LOW (ref 15–41)
Albumin: 4.5 g/dL (ref 3.5–5.0)
Alkaline Phosphatase: 74 U/L (ref 38–126)
Anion gap: 6 (ref 5–15)
BUN: 25 mg/dL — ABNORMAL HIGH (ref 6–20)
CO2: 28 mmol/L (ref 22–32)
Calcium: 9.5 mg/dL (ref 8.9–10.3)
Chloride: 104 mmol/L (ref 98–111)
Creatinine: 0.7 mg/dL (ref 0.44–1.00)
GFR, Estimated: >60 mL/min (ref >60)
Glucose, Bld: 187 mg/dL — ABNORMAL HIGH (ref 70–99)
Potassium: 4 mmol/L (ref 3.5–5.1)
Sodium: 138 mmol/L (ref 135–145)
Total Bilirubin: 0.5 mg/dL (ref 0.3–1.2)
Total Protein: 7 g/dL (ref 6.5–8.1)

## 2021-07-26 NOTE — Assessment & Plan Note (Signed)
09/10/2020: Screening mammogram detected left breast mass later palpable, 1.2 cm at 5 o'clock position, axilla negative, biopsy: Grade 1-2 IDC, ER greater than 95%, PR 90%, Ki-67 5%, HER-2 negative ratio 1.36  09/16/2020: Breast MRI: 0.6 cm mass UOQ, non-mass enhancement LOQ spanning 2.1 cm, MRI guided biopsy is recommended for the indeterminate mass in the non-mass enhancement. 10/15/20: Left Lumpectomy: 1.9 cm Grade 2 IDC , Margins Neg,ER greater than 95%, PR 90%, Ki-67 5%, HER-2 negative ratio 1.36  Oncotype Dx: Score 16, ROR 4% Adj XRT 11/18/20-12/15/20  Bone density: -2.7: Osteoporosis  Surveillance:  1.  Breast exam 03/18/2019: Benign 2. mammogram and ultrasound 04/04/2021: Benign breast density category C 3. Breast MRI: 04/27/2021: Benign breast density category C  breast MRI every 2 to 3 years as needed. 4.  [facility] lab test for minimal residual disease: Negative.  Return to clinic in 1 year for follow-up

## 2021-08-05 ENCOUNTER — Other Ambulatory Visit: Payer: Self-pay

## 2021-08-05 ENCOUNTER — Ambulatory Visit (HOSPITAL_COMMUNITY)
Admission: RE | Admit: 2021-08-05 | Discharge: 2021-08-05 | Disposition: A | Discharge status: Home / Self Care | Payer: BC Managed Care – PPO | Source: Ambulatory Visit | Attending: Hematology and Oncology | Admitting: Hematology and Oncology

## 2021-08-05 DIAGNOSIS — C50512 Malignant neoplasm of lower-outer quadrant of left female breast: Secondary | ICD-10-CM | POA: Diagnosis not present

## 2021-08-05 DIAGNOSIS — Z170 Estrogen receptor positive status [ER+]: Secondary | ICD-10-CM | POA: Insufficient documentation

## 2021-08-05 LAB — GLUCOSE, CAPILLARY: Glucose-Capillary: 128 mg/dL — ABNORMAL HIGH (ref 70–99)

## 2021-08-05 IMAGING — CT NM PET TUM IMG INITIAL (PI) SKULL BASE T - THIGH
7 series · 25 of 25 positions shown · non-contrast
Comparison: None.

CLINICAL DATA: Initial treatment strategy for LEFT breast
carcinoma.

EXAM:
NUCLEAR MEDICINE PET SKULL BASE TO THIGH
TECHNIQUE: 7.6 mCi F-18 FDG was injected intravenously. Full-ring PET imaging
was performed from the skull base to thigh after the radiotracer. CT
data was obtained and used for attenuation correction and anatomic
localization.
Fasting blood glucose: 128 mg/dl

[Series 3: pet sk_thigh ac · axial · 5.0mm · 4.07mm/px · z∈[-992,-60]mm · 6 of 234 slices shown]
[im 1/234]
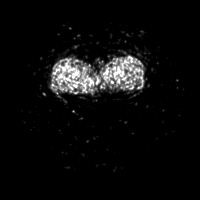
[im 47/234]
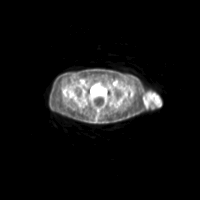
[im 94/234]
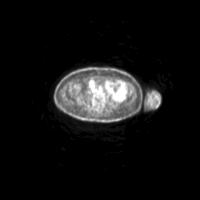
[im 140/234]
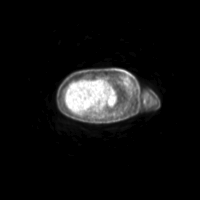
[im 187/234]
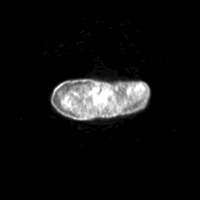
[im 234/234]
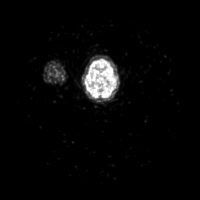

[Series 4: ct sk_thigh 5.0 bf37 · axial · 5.0mm · 0.98mm/px · z∈[-992,-60]mm · 5 of 234 slices shown]
[im 1/234]
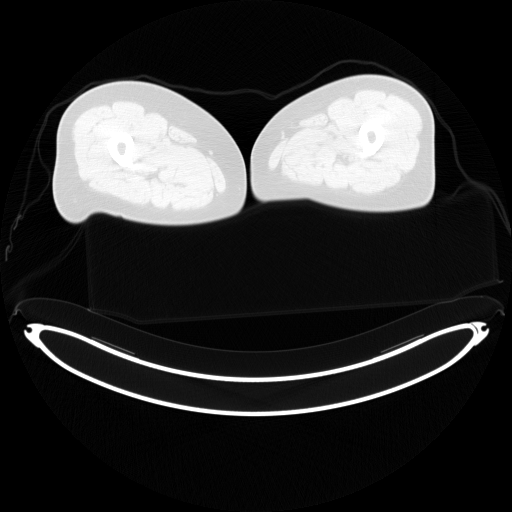
[im 59/234]
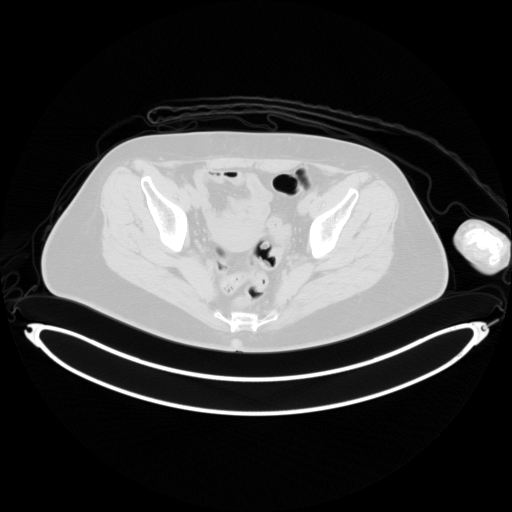
[im 117/234]
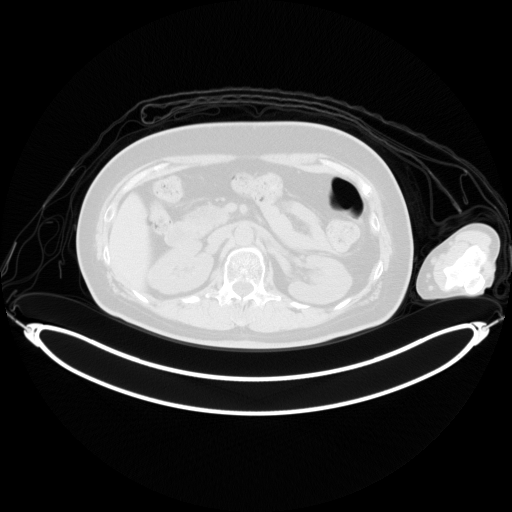
[im 175/234]
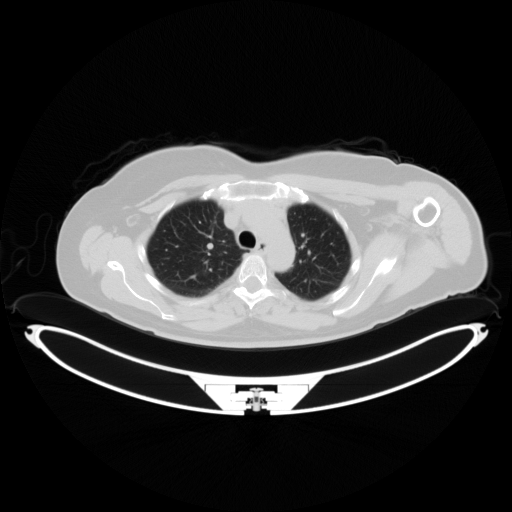
[im 234/234  brain]
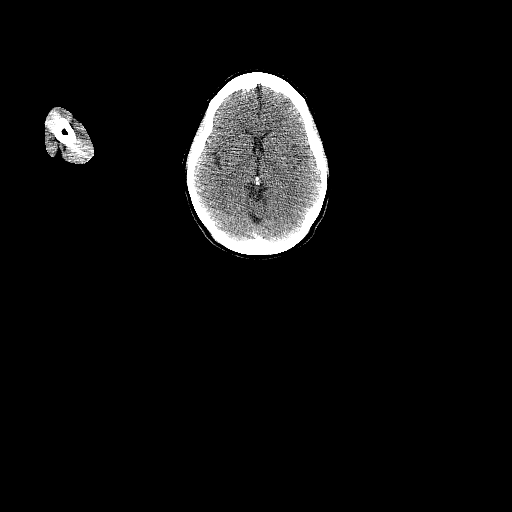

[Series 5: pet sk_thigh nac · axial · 5.0mm · 4.07mm/px · z∈[-992,-60]mm · 5 of 234 slices shown]
[im 1/234]
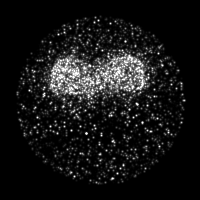
[im 59/234]
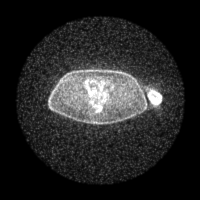
[im 117/234]
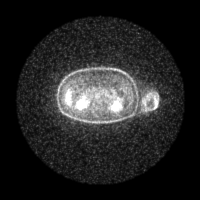
[im 175/234]
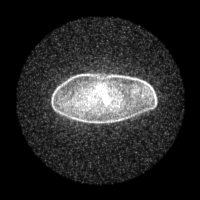
[im 234/234]
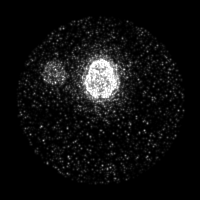

[Series 8: ct sk_thigh 5.0 br59 lung_bone · axial · 5.0mm · 0.58mm/px · z∈[-508,-236]mm · 2 of 69 slices shown]
[im 1/69]
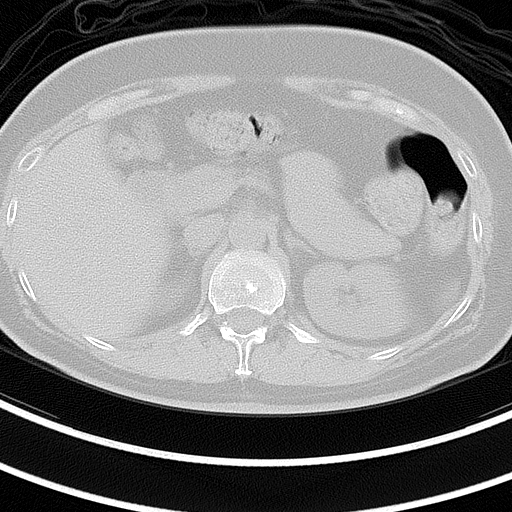
[im 69/69]
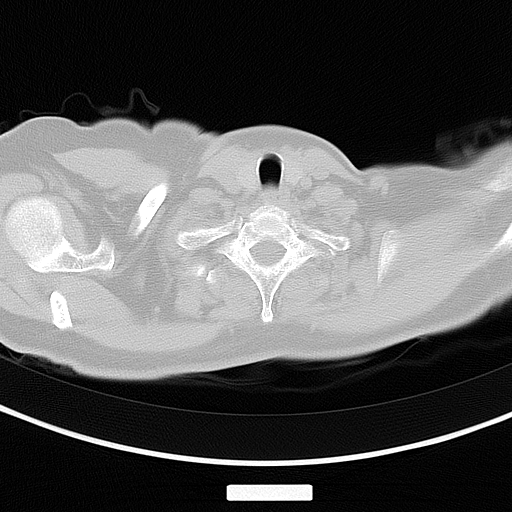

[Series 603: fused cor · 1 of 38 slices shown]
[im 1/38]
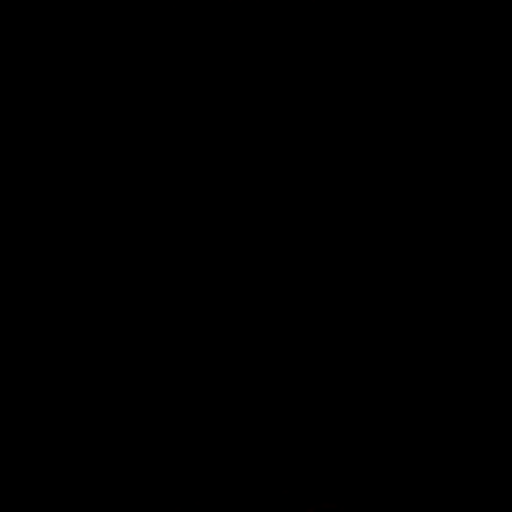

[Series 604: <mip collection> · coronal · 1.93mm/px · 1 of 32 slices shown]
[im 1/32]
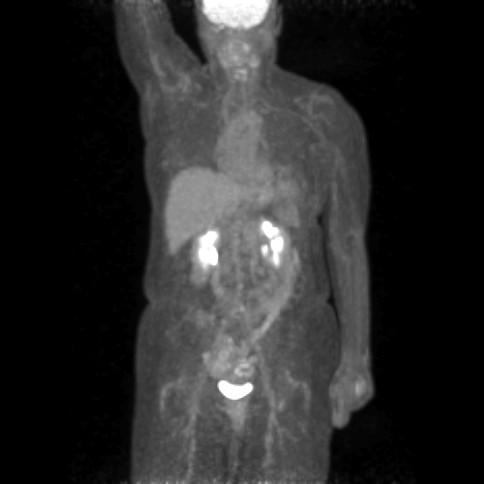

[Series 605: range-ac ct sk_thigh 5.0 hd_fov-tra-<alpha range> · 5 of 229 slices shown]
[im 1/229]
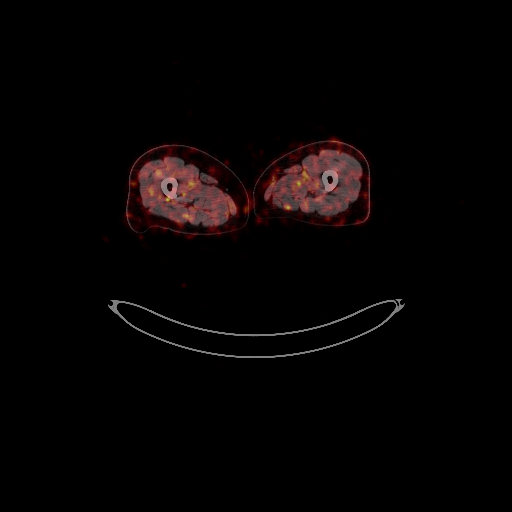
[im 58/229]
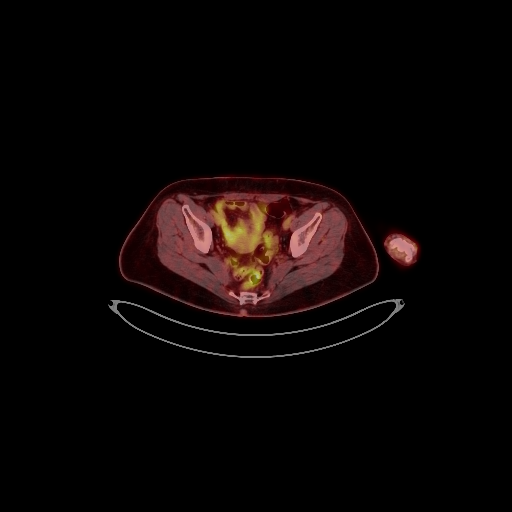
[im 115/229]
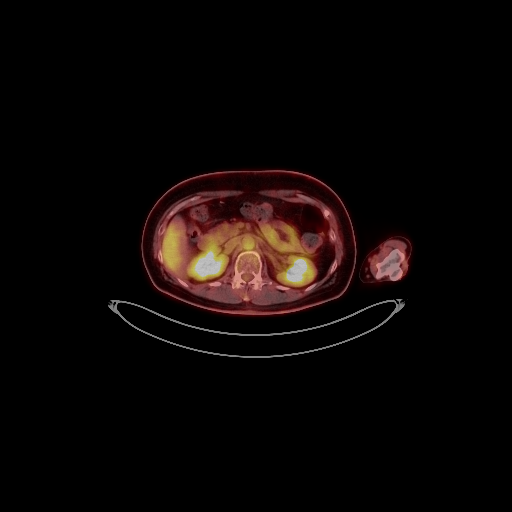
[im 172/229]
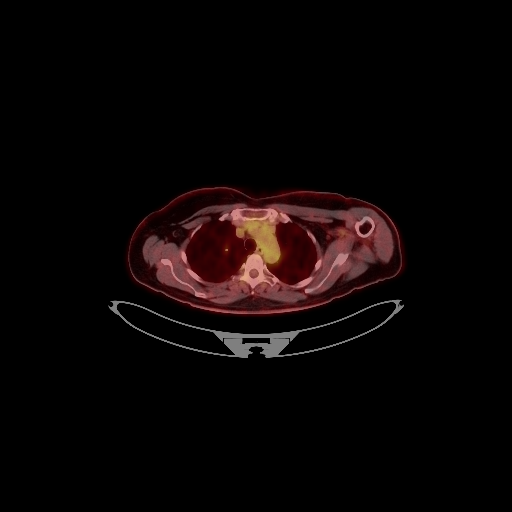
[im 229/229]
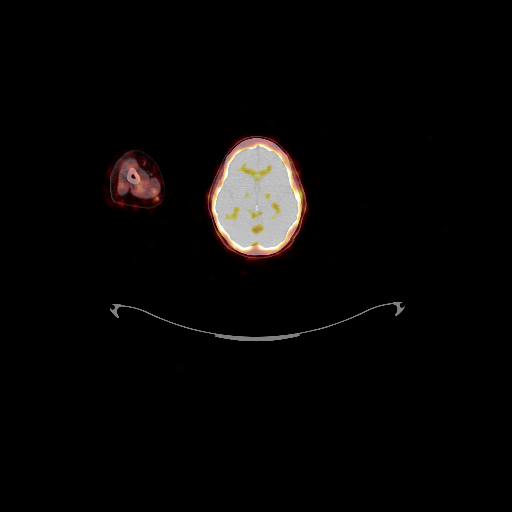

[25 of 25 positions shown; findings below may reference images not displayed]

FINDINGS: Mediastinal blood pool activity: SUV max

Liver activity: SUV max

NECK: No hypermetabolic lymph nodes in the neck.

Incidental CT findings: none

CHEST: Low metabolic activity in the symmetric breast tissue on the
LEFT with SUV max equal 2.2. Mild metabolic activity associated with
skin thickening of the LEFT breast with SUV max equal 3.2. Several
biopsy clips within the LEFT breast tissue.

No hypermetabolic axillary lymph nodes. Lymphadenectomy clips in
LEFT axilla.

No hypermetabolic internal mammary or mediastinal lymph nodes.

Incidental CT findings: No suspicious pulmonary nodules.

ABDOMEN/PELVIS: No abnormal hypermetabolic activity within the
liver, pancreas, adrenal glands, or spleen. No hypermetabolic lymph
nodes in the abdomen or pelvis.

Incidental CT findings: Postcholecystectomy. Uterus and ovaries
unremarkable

SKELETON: No focal hypermetabolic activity to suggest skeletal
metastasis. Focal metabolic activity between the spinous processes
of L4-5 is consistent degenerative arthropathy.

Incidental CT findings: Small round sclerotic lesion in the L1
vertebral body associated endplate favored benign degenerative
change
IMPRESSION: 1. Mild metabolic activity associated with asymmetric LEFT breast
tissue and asymmetric LEFT breast skin thickening.
2. No metastatic adenopathy identified in the thorax.
3. No distant metastatic disease.
4. The relatively mild metabolic activity of the LEFT breast
carcinoma could reduce sensitivity for metastatic disease. F 18
estradiol PET-CT scan may increase sensitivity for estrogen
receptor-positive carcinoma if concern for metastasis. No evidence
of metastatic disease on the noncontrast CT portion of exam.

## 2021-08-05 MED ORDER — FLUDEOXYGLUCOSE F - 18 (FDG) INJECTION
7.5000 | Freq: Once | INTRAVENOUS | Status: AC
  Administered 2021-08-05: 7.61 via INTRAVENOUS

## 2021-08-24 ENCOUNTER — Other Ambulatory Visit: Payer: Self-pay | Admitting: Adult Health

## 2021-08-24 ENCOUNTER — Ambulatory Visit
Admission: RE | Admit: 2021-08-24 | Discharge: 2021-08-24 | Disposition: A | Discharge status: Home / Self Care | Payer: BC Managed Care – PPO | Source: Ambulatory Visit | Attending: Adult Health | Admitting: Adult Health

## 2021-08-24 DIAGNOSIS — N6489 Other specified disorders of breast: Secondary | ICD-10-CM

## 2021-08-24 DIAGNOSIS — C50512 Malignant neoplasm of lower-outer quadrant of left female breast: Secondary | ICD-10-CM

## 2021-08-24 DIAGNOSIS — Z170 Estrogen receptor positive status [ER+]: Secondary | ICD-10-CM

## 2021-08-24 HISTORY — DX: Malignant neoplasm of unspecified site of unspecified female breast: C50.919

## 2021-08-24 IMAGING — MG DIGITAL DIAGNOSTIC BILAT W/ TOMO W/ CAD
8 of 14 series · 8 of 40 positions shown · non-contrast
Comparison: Previous exam(s).

CLINICAL DATA: History of LEFT breast cancer in [17] status post
breast conservation surgery. Additional benign MRI-guided biopsies
of the LEFT breast on [DATE].

EXAM:
DIGITAL DIAGNOSTIC BILATERAL MAMMOGRAM WITH TOMOSYNTHESIS AND CAD;
ULTRASOUND LEFT BREAST LIMITED
TECHNIQUE: Bilateral digital diagnostic mammography and breast tomosynthesis
was performed. The images were evaluated with computer-aided
detection.; Targeted ultrasound examination of the left breast was
performed.

[L CC]
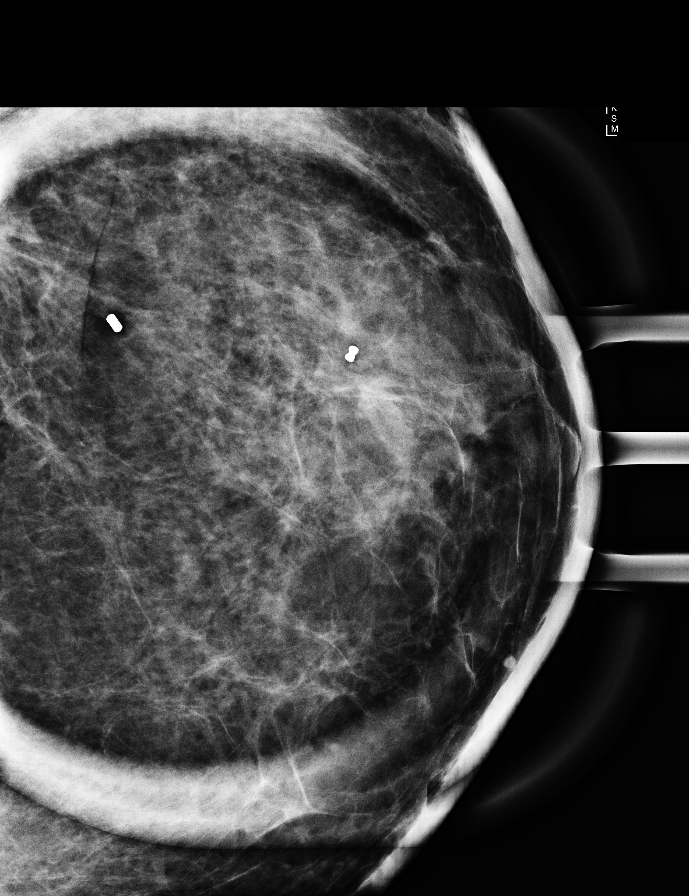

[L CC synth-2D (1 of 3)]
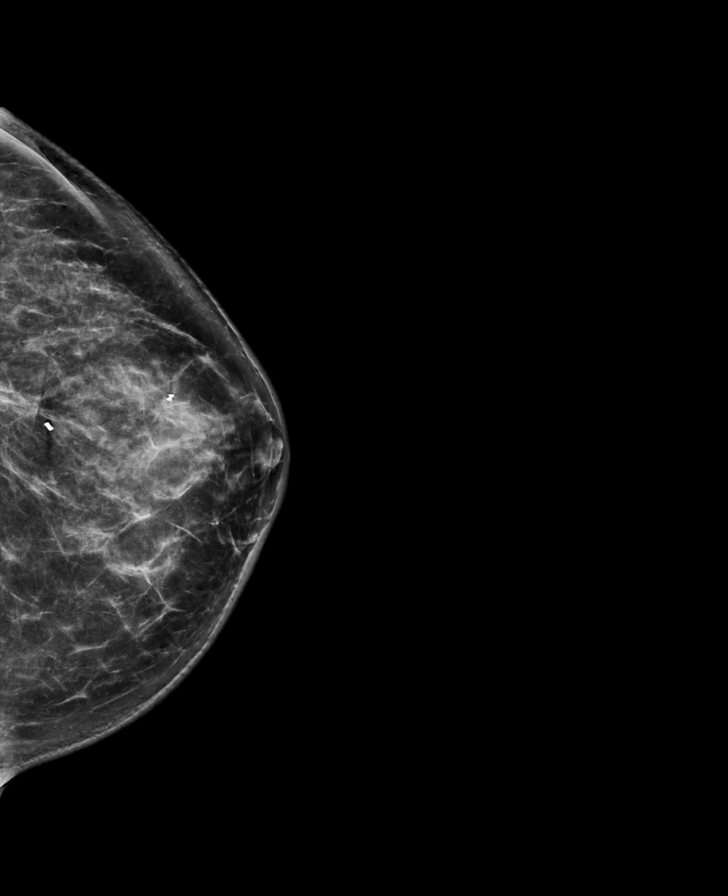

[L ML synth-2D]
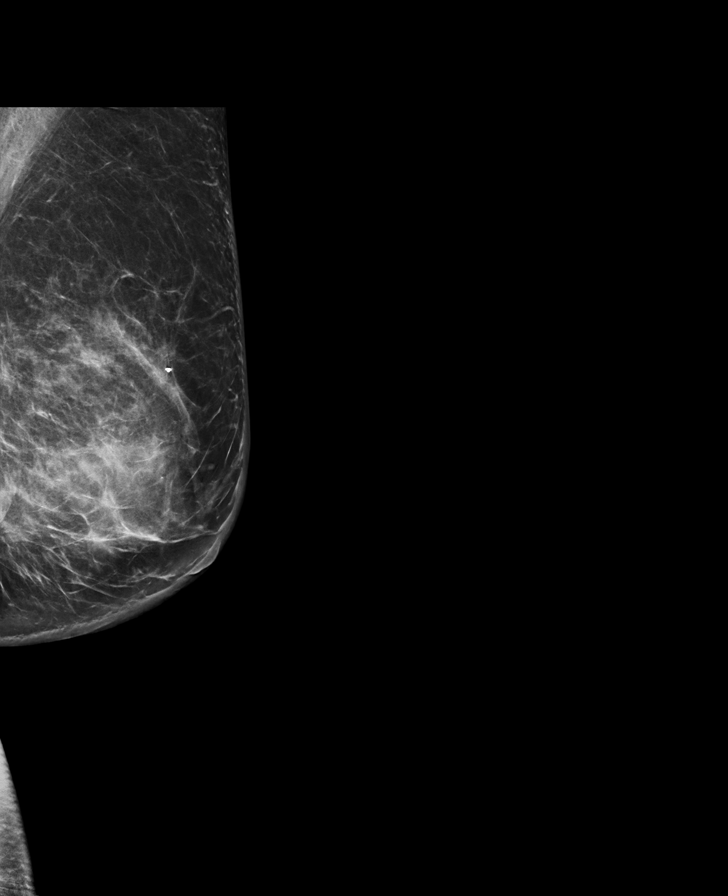

[R CC synth-2D]
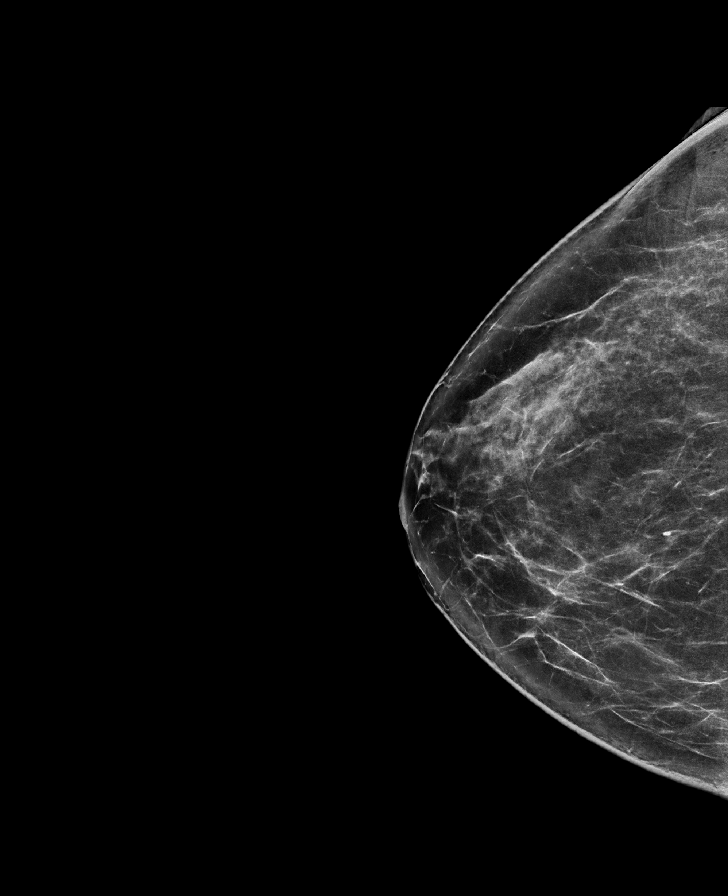

[R MLO synth-2D]
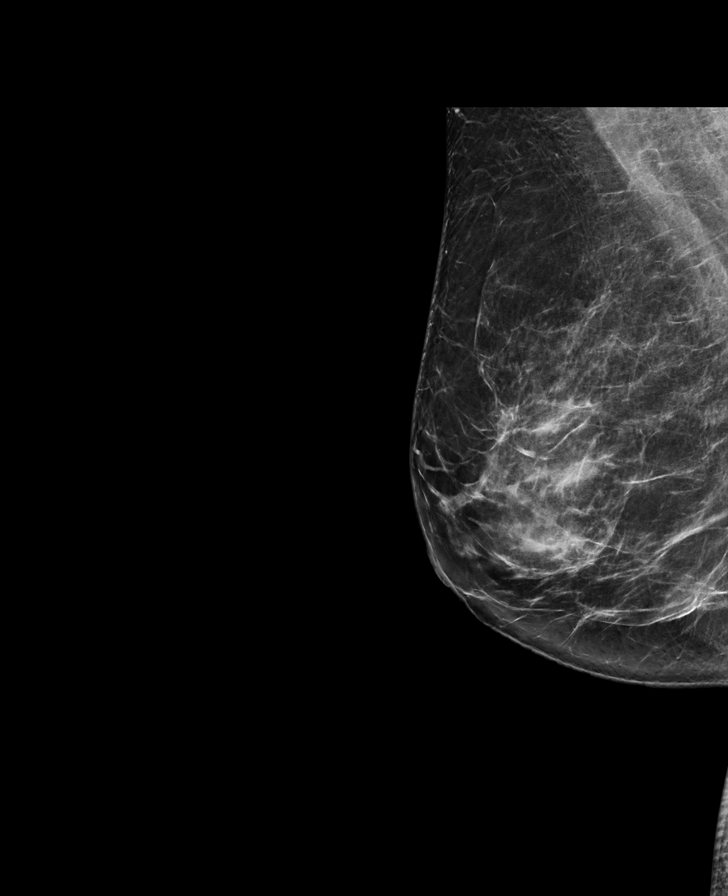

[L CC synth-2D (2 of 3)]
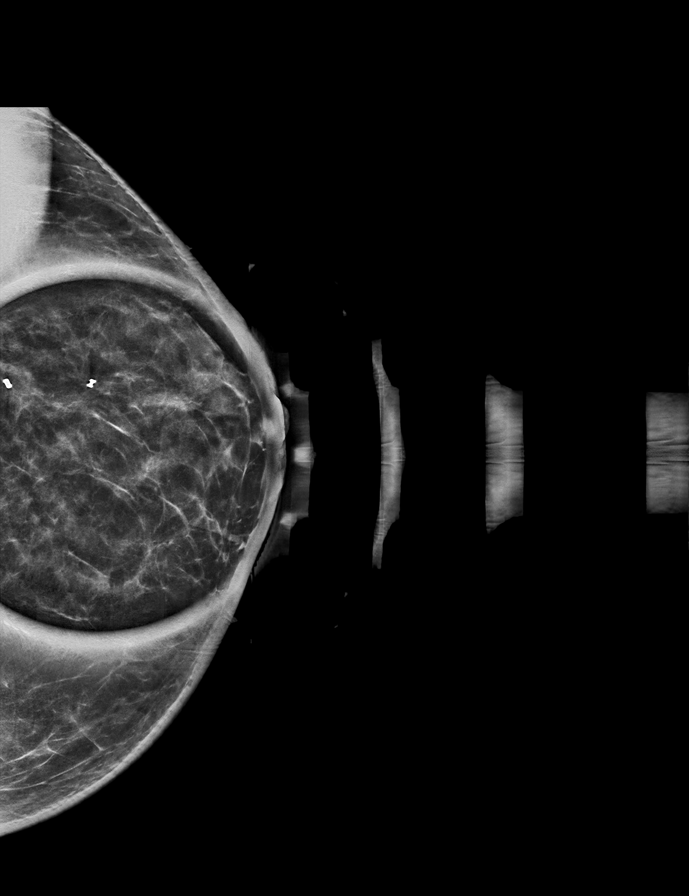

[L CC synth-2D (3 of 3)]
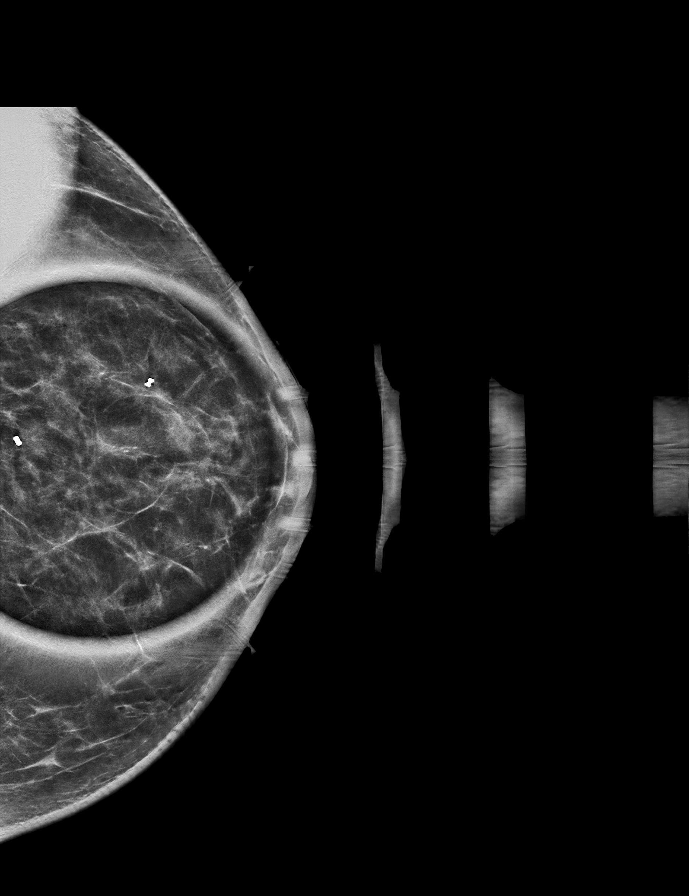

[L ML tomo · tomo slice 42/83.0]
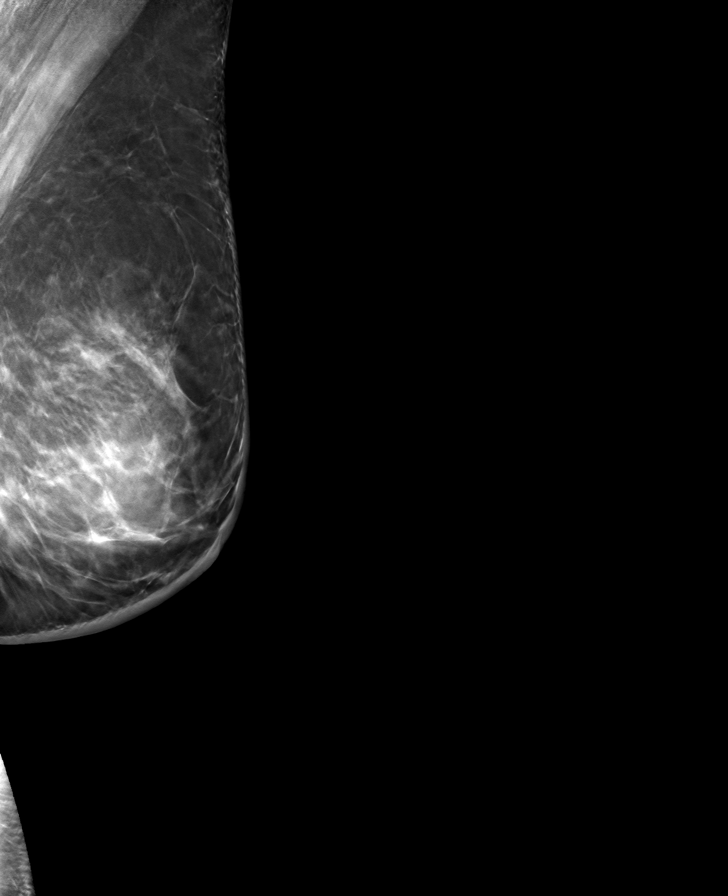

[8 of 40 positions shown; findings below may reference images not displayed]

ACR Breast Density Category c: The breast tissue is heterogeneously
dense, which may obscure small masses.
FINDINGS: There is a new architectural distortion within the upper-outer
quadrant of the LEFT breast, 2 o'clock axis region, adjacent to the
barbell shaped biopsy clip at anterior depth.

There are expected postsurgical changes within the lower LEFT breast
at posterior depth. Additional cylinder-shaped clip within the
posterior LEFT breast is stable in position.

There are no new dominant masses, suspicious calcifications or
secondary signs of malignancy within the RIGHT breast.

Targeted ultrasound is performed, showing a hypoechoic non-mass area
within the LEFT breast at the 1 o'clock axis, 3 cm from the nipple,
measuring 1.1 x 0.5 x 0.8 cm, containing a biopsy clip, and with
surrounding architectural distortion, corresponding to the
mammographic findings.
IMPRESSION: 1. Hypoechoic non-mass area within the LEFT breast at the 1 o'clock
axis, 3 cm from the nipple, measuring 1.1 x 0.5 x 0.8 cm, containing
a BARBELL shaped biopsy clip, with surrounding architectural
distortion, corresponding to mammographic findings. This may
represent post biopsy change/fibrosis/scar. Ultrasound-guided biopsy
is recommended to exclude malignancy.
2. Expected postsurgical changes within the lower outer quadrant of
the LEFT breast.
3. No evidence of malignancy within the RIGHT breast.

RECOMMENDATION:
1. Ultrasound-guided biopsy for the hypoechoic non-mass area located
within the LEFT breast at the 1 o'clock axis, 3 cm from nipple,
measuring 1.1 cm, containing a barbell shaped biopsy clip, with
surrounding architectural distortion.
2. Attention to the postprocedure mammogram to ensure location of
the clip placed at ultrasound-guided biopsy corresponds to the new
architectural distortion seen on mammogram adjacent to the biopsy
clip. If does not correspond, and if ultrasound-guided biopsy
results are benign, additional stereotactic biopsy would be
recommended for the distortion.

Ultrasound-guided biopsy is scheduled on [REDACTED].

I have discussed the findings and recommendations with the patient.
If applicable, a reminder letter will be sent to the patient
regarding the next appointment.

BI-RADS CATEGORY  4: Suspicious.

## 2021-08-24 IMAGING — US US BREAST*L* LIMITED INC AXILLA
1 series · 8 of 8 positions shown · non-contrast
Comparison: Previous exam(s).

CLINICAL DATA: History of LEFT breast cancer in [17] status post
breast conservation surgery. Additional benign MRI-guided biopsies
of the LEFT breast on [DATE].

EXAM:
DIGITAL DIAGNOSTIC BILATERAL MAMMOGRAM WITH TOMOSYNTHESIS AND CAD;
ULTRASOUND LEFT BREAST LIMITED
TECHNIQUE: Bilateral digital diagnostic mammography and breast tomosynthesis
was performed. The images were evaluated with computer-aided
detection.; Targeted ultrasound examination of the left breast was
performed.

[Series 1: us breast*left* limited inc axilla · 0.06mm/px · 8 of 8 slices shown]
[im 1/8]
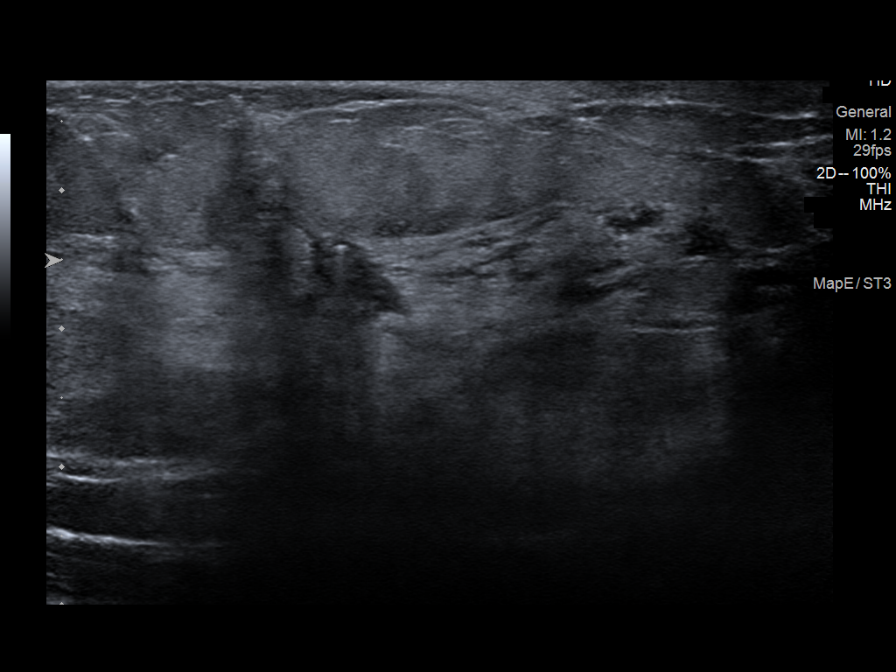
[im 2/8]
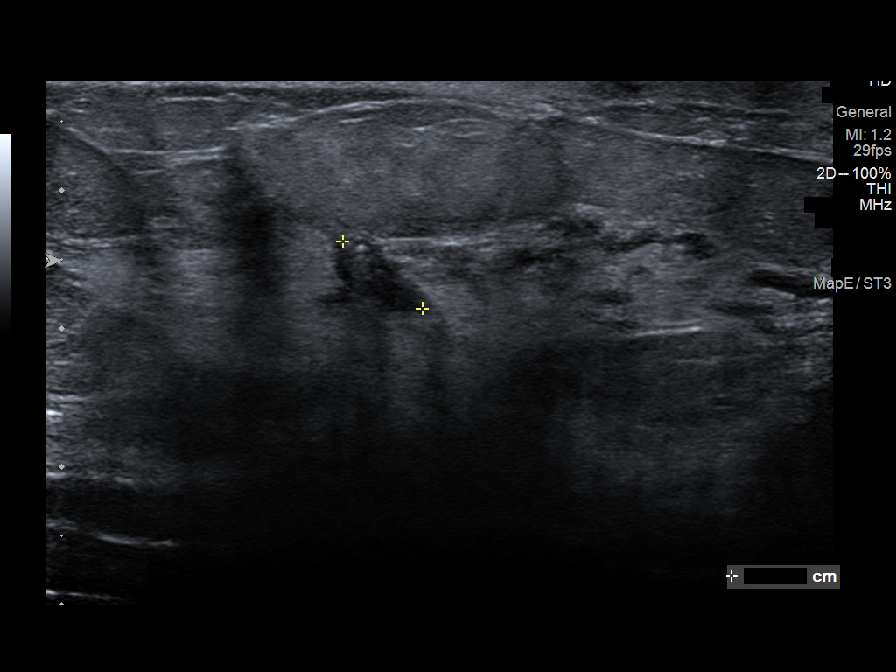
[im 3/8]
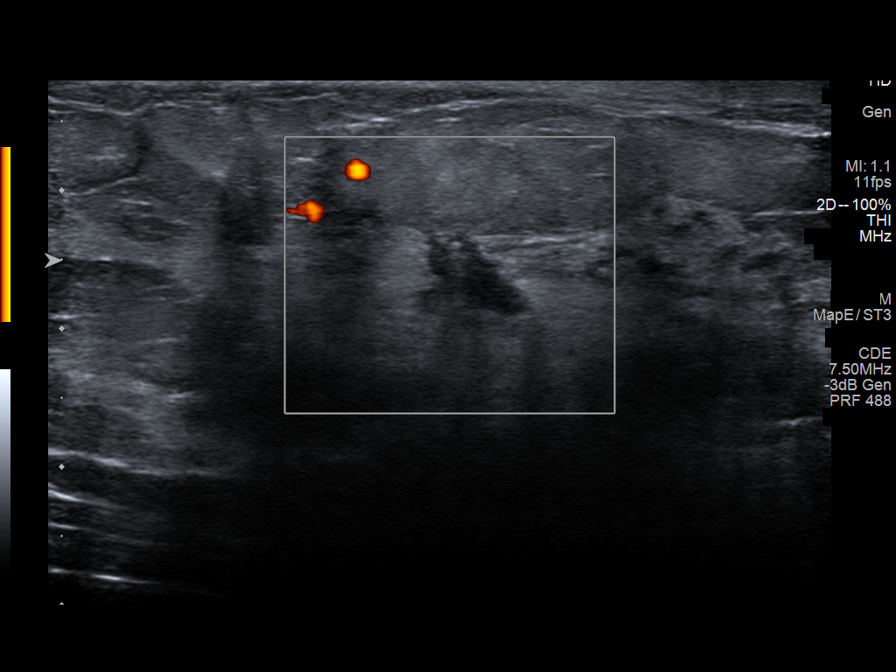
[im 4/8]
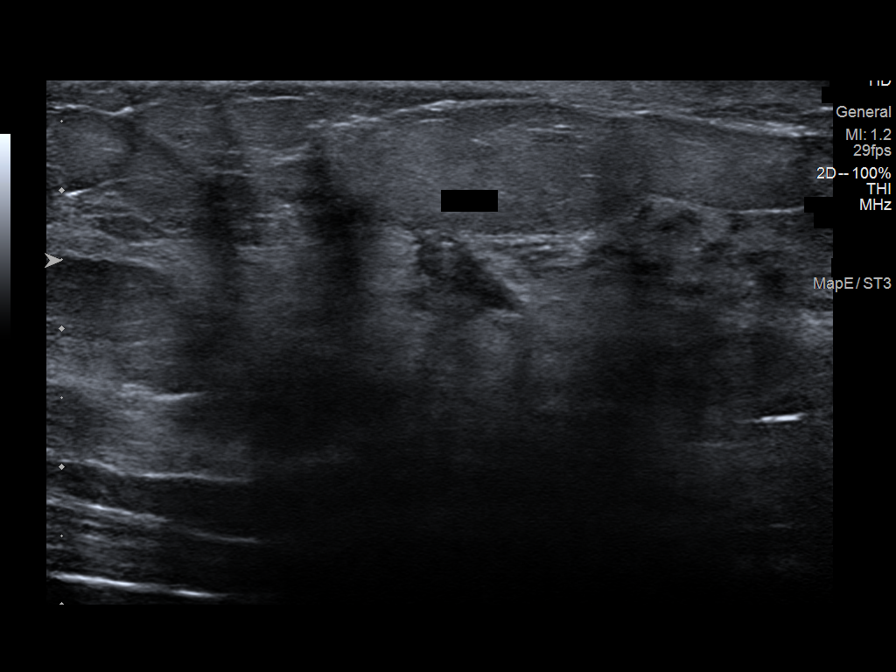
[im 5/8]
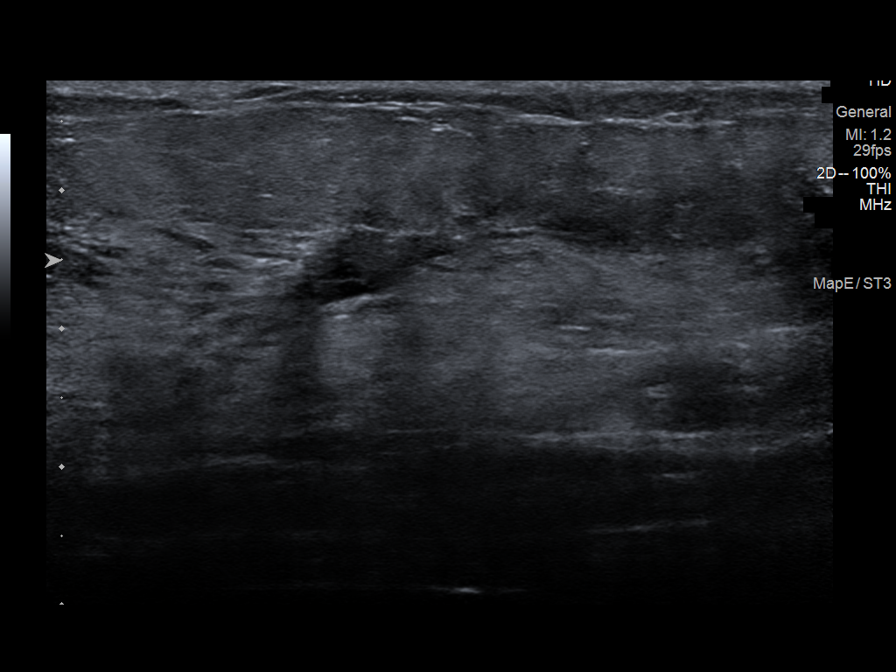
[im 6/8]
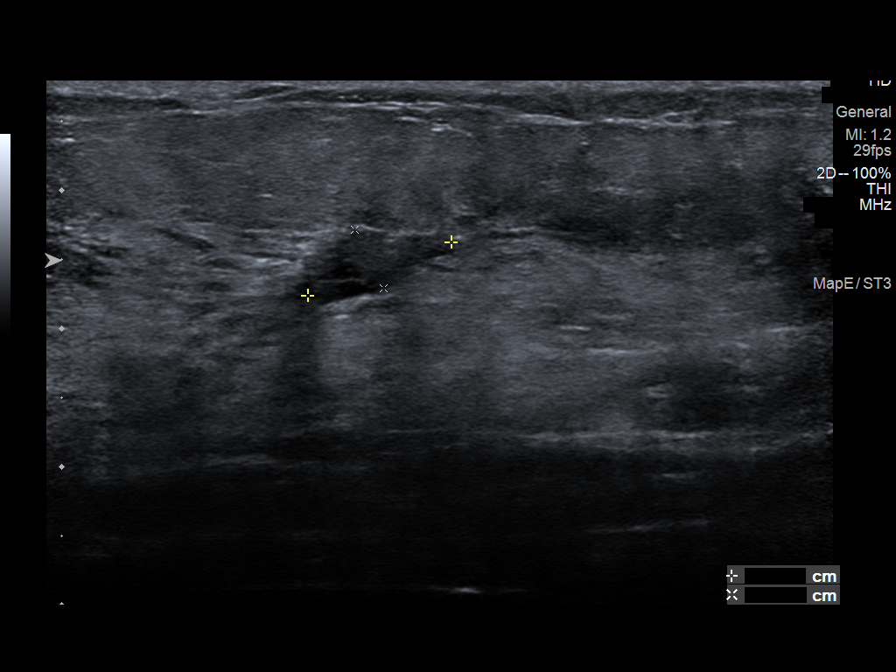
[im 7/8]
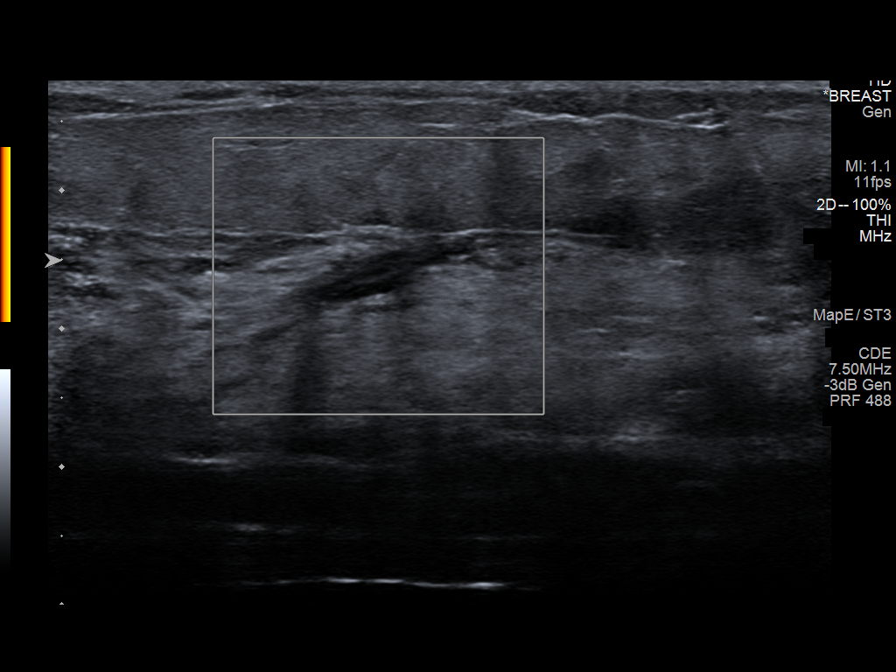
[im 8/8]
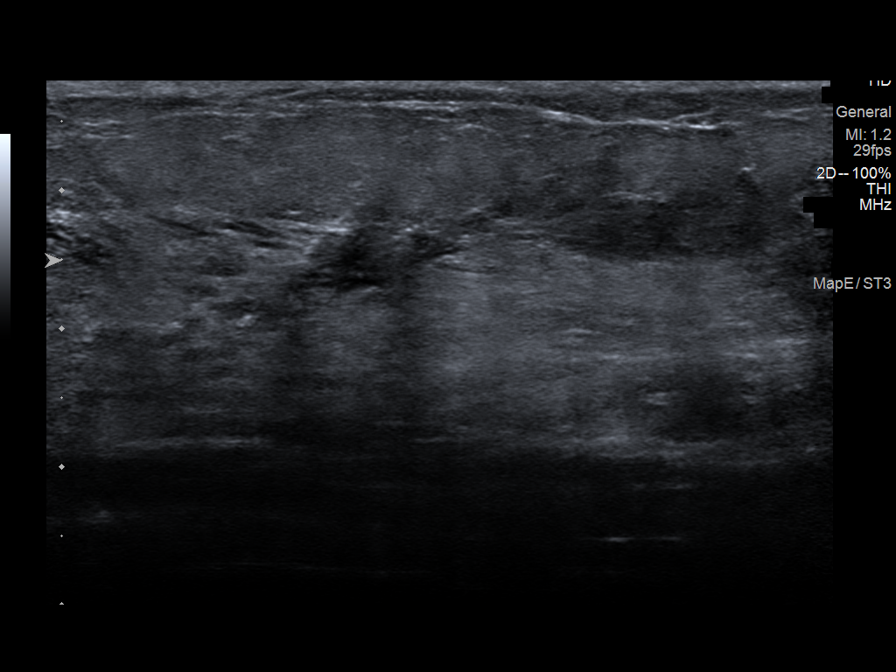

[8 of 8 positions shown; findings below may reference images not displayed]

ACR Breast Density Category c: The breast tissue is heterogeneously
dense, which may obscure small masses.
FINDINGS: There is a new architectural distortion within the upper-outer
quadrant of the LEFT breast, 2 o'clock axis region, adjacent to the
barbell shaped biopsy clip at anterior depth.

There are expected postsurgical changes within the lower LEFT breast
at posterior depth. Additional cylinder-shaped clip within the
posterior LEFT breast is stable in position.

There are no new dominant masses, suspicious calcifications or
secondary signs of malignancy within the RIGHT breast.

Targeted ultrasound is performed, showing a hypoechoic non-mass area
within the LEFT breast at the 1 o'clock axis, 3 cm from the nipple,
measuring 1.1 x 0.5 x 0.8 cm, containing a biopsy clip, and with
surrounding architectural distortion, corresponding to the
mammographic findings.
IMPRESSION: 1. Hypoechoic non-mass area within the LEFT breast at the 1 o'clock
axis, 3 cm from the nipple, measuring 1.1 x 0.5 x 0.8 cm, containing
a BARBELL shaped biopsy clip, with surrounding architectural
distortion, corresponding to mammographic findings. This may
represent post biopsy change/fibrosis/scar. Ultrasound-guided biopsy
is recommended to exclude malignancy.
2. Expected postsurgical changes within the lower outer quadrant of
the LEFT breast.
3. No evidence of malignancy within the RIGHT breast.

RECOMMENDATION:
1. Ultrasound-guided biopsy for the hypoechoic non-mass area located
within the LEFT breast at the 1 o'clock axis, 3 cm from nipple,
measuring 1.1 cm, containing a barbell shaped biopsy clip, with
surrounding architectural distortion.
2. Attention to the postprocedure mammogram to ensure location of
the clip placed at ultrasound-guided biopsy corresponds to the new
architectural distortion seen on mammogram adjacent to the biopsy
clip. If does not correspond, and if ultrasound-guided biopsy
results are benign, additional stereotactic biopsy would be
recommended for the distortion.

Ultrasound-guided biopsy is scheduled on [REDACTED].

I have discussed the findings and recommendations with the patient.
If applicable, a reminder letter will be sent to the patient
regarding the next appointment.

BI-RADS CATEGORY  4: Suspicious.

## 2021-09-02 ENCOUNTER — Ambulatory Visit
Admission: RE | Admit: 2021-09-02 | Discharge: 2021-09-02 | Disposition: A | Discharge status: Home / Self Care | Payer: BC Managed Care – PPO | Source: Ambulatory Visit | Attending: Adult Health | Admitting: Adult Health

## 2021-09-02 DIAGNOSIS — N6489 Other specified disorders of breast: Secondary | ICD-10-CM

## 2021-09-05 ENCOUNTER — Encounter: Payer: Self-pay | Admitting: Hematology and Oncology

## 2021-09-05 ENCOUNTER — Other Ambulatory Visit: Payer: BC Managed Care – PPO

## 2021-09-20 ENCOUNTER — Encounter: Payer: Self-pay | Admitting: Hematology and Oncology

## 2021-10-06 ENCOUNTER — Encounter: Payer: Self-pay | Admitting: Hematology and Oncology

## 2021-11-01 NOTE — Progress Notes (Incomplete)
? ?Patient Care Team: ?Vania Rea, MD as PCP - General (Obstetrics and Gynecology) ?Nicholas Lose, MD as Consulting Physician (Hematology and Oncology) ?Eppie Gibson, MD as Attending Physician (Radiation Oncology) ?Rolm Bookbinder, MD as Consulting Physician (General Surgery) ? ?DIAGNOSIS: No diagnosis found. ? ?SUMMARY OF ONCOLOGIC HISTORY: ?Oncology History  ?Malignant neoplasm of lower-outer quadrant of left breast of female, estrogen receptor positive ([place])  ?09/10/2020 Initial Diagnosis  ? Screening mammogram showed a possible left breast mass, palpable on exam. Diagnostic mammogram and US showed a 1.2cm mass at the 5 o'clock position and no abnormal axillary lymph nodes. Biopsy showed invasive mammary carcinoma, grade 1-2, HER-2 equivocal by IHC (2+), negative by FISH (ratio 1.36), ER+>95%, PR+ 90%, Ki67 5%. B ?  ?09/10/2020 Cancer Staging  ? Staging form: Breast, AJCC 8th Edition ?- Clinical stage from 09/10/2020: Stage IB (cT2, cN0, cM0, G2, ER+, PR+, HER2-) - Signed by Gardenia Phlegm, NP on 09/22/2020 ?Stage prefix: Initial diagnosis ?Histologic grading system: 3 grade system ? ?  ?09/16/2020 Breast MRI  ? 0.6cm mass in the upper outer quadrant, non-mass enhancement in the low outer quadrant, and the known malignancy spanning 2.1cm in the left breast with no evidence of right breast malignancy. ?  ? Genetic Testing  ? Negative genetic testing. No pathogenic variants identified on the [facility] STAT+Multi-Cancer Panel. The report date is 10/04/2020. ? ?The STAT Breast cancer panel offered by [facility] includes sequencing and rearrangement analysis for the following 9 genes:  ATM, BRCA1, BRCA2, CDH1, CHEK2, PALB2, PTEN, STK11 and TP53.   ? ?The Multi-Cancer Panel + RNA offered by [facility] includes sequencing and/or deletion duplication testing of the following 84 genes: AIP, ALK, APC, ATM, AXIN2,BAP1,  BARD1, BLM, BMPR1A, BRCA1, BRCA2, BRIP1, CASR, CDC73, CDH1, CDK4, CDKN1B, CDKN1C, CDKN2A (p14ARF),  CDKN2A (p16INK4a), CEBPA, CHEK2, CTNNA1, DICER1, DIS3L2, EGFR (c.2369C>T, p.Thr790Met variant only), EPCAM (Deletion/duplication testing only), FH, FLCN, GATA2, GPC3, GREM1 (Promoter region deletion/duplication testing only), HOXB13 (c.251G>A, p.Gly84Glu), HRAS, KIT, MAX, MEN1, MET, MITF (c.952G>A, p.Glu318Lys variant only), MLH1, MSH2, MSH3, MSH6, MUTYH, NBN, NF1, NF2, NTHL1, PALB2, PDGFRA, PHOX2B, PMS2, POLD1, POLE, POT1, PRKAR1A, PTCH1, PTEN, RAD50, RAD51C, RAD51D, RB1, RECQL4, RET, RUNX1, SDHAF2, SDHA (sequence changes only), SDHB, SDHC, SDHD, SMAD4, SMARCA4, SMARCB1, SMARCE1, STK11, SUFU, TERC, TERT, TMEM127, TP53, TSC1, TSC2, VHL, WRN and WT1. ?  ?10/15/2020 Surgery  ? Left breast lumpectomy: IDC, grade 2, margins neg, 1 SLN neg; T1c, N0, M0, g2, ER+/PR+/HER-2- ?  ?10/15/2020 Oncotype testing  ? Score 16, ROR 4% ?  ?11/04/2020 Cancer Staging  ? Staging form: Breast, AJCC 8th Edition ?- Pathologic: Stage IA (pT1c, pN0, cM0, G2, ER+, PR+, HER2-) - Signed by Nicholas Lose, MD on 11/04/2020 ?Stage prefix: Initial diagnosis ?Histologic grading system: 3 grade system ? ?  ?11/17/2020 - 12/15/2020 Radiation Therapy  ? Adjuvant radiation therapy ?Site Technique Total Dose (Gy) Dose per Fx (Gy) Completed Fx Beam Energies  ?Breast, Left: Breast_Lt 3D 40.05/40.05 2.67 15/15 6XFFF  ?Breast, Left: Breast_Lt_Bst 3D 10/10 2 5/5 6X  ?  ?03/17/2021 -  Anti-estrogen oral therapy  ? Declined due to extensive musculoskeletal aches and neuropathic pains ?  ? ? ?CHIEF COMPLIANT: Multiple sites of pain throughout her body ? ?INTERVAL HISTORY: Amy Stephenson is a  56 y.o. with above-mentioned history of left breast cancer. She presents to the clinic today for follow-up. ? ? ?ALLERGIES:  is allergic to penicillins, augmentin [amoxicillin-pot clavulanate], and sulfa antibiotics. ? ?MEDICATIONS:  ?Current Outpatient Medications  ?Medication Sig Dispense Refill  ?  escitalopram (LEXAPRO) 10 MG tablet Take 1 tablet (10 mg total) by mouth daily.  Needs ov. Thanks. 90 tablet 0  ? gabapentin (NEURONTIN) 300 MG capsule Take 1 capsule (300 mg total) by mouth at bedtime. (Patient not taking: Reported on 04/15/2021) 90 capsule 3  ? glipiZIDE (GLUCOTROL XL) 5 MG 24 hr tablet Take 5 mg by mouth daily with breakfast.    ? MULTIPLE VITAMIN PO Take 0.5 tablets by mouth.     ? SitaGLIPtin-MetFORMIN HCl [phone] MG TB24 Janumet XR 100 mg-1,000 mg tablet,extended release    ? zolpidem (AMBIEN) 10 MG tablet zolpidem 10 mg tablet    ? ?No current facility-administered medications for this visit.  ? ? ?PHYSICAL EXAMINATION: ?ECOG PERFORMANCE STATUS: {CHL ONC ECOG UR:[id number]} ? ?There were no vitals filed for this visit. ?There were no vitals filed for this visit. ? ?BREAST:*** No palpable masses or nodules in either right or left breasts. No palpable axillary supraclavicular or infraclavicular adenopathy no breast tenderness or nipple discharge. (exam performed in the presence of a chaperone) ? ?LABORATORY DATA:  ?I have reviewed the data as listed ? ?  Latest Ref Rng & Units 07/26/2021  ?  9:29 AM 10/06/2020  ?  2:30 PM  ?CMP  ?Glucose 70 - 99 mg/dL 187   150    ?BUN 6 - 20 mg/dL 25   18    ?Creatinine 0.44 - 1.00 mg/dL 0.70   0.66    ?Sodium 135 - 145 mmol/L 138   138    ?Potassium 3.5 - 5.1 mmol/L 4.0   4.0    ?Chloride 98 - 111 mmol/L 104   100    ?CO2 22 - 32 mmol/L 28   30    ?Calcium 8.9 - 10.3 mg/dL 9.5   9.7    ?Total Protein 6.5 - 8.1 g/dL 7.0     ?Total Bilirubin 0.3 - 1.2 mg/dL 0.5     ?Alkaline Phos 38 - 126 U/L 74     ?AST 15 - 41 U/L 14     ?ALT 0 - 44 U/L 15     ? ? ?Lab Results  ?Component Value Date  ? WBC 5.4 07/26/2021  ? HGB 12.6 07/26/2021  ? HCT 38.4 07/26/2021  ? MCV 90.1 07/26/2021  ? PLT 281 07/26/2021  ? NEUTROABS 2.9 07/26/2021  ? ? ?ASSESSMENT & PLAN:  ?No problem-specific Assessment & Plan notes found for this encounter. ? ? ? ?No orders of the defined types were placed in this encounter. ? ?The patient has a good understanding of the overall  plan. she agrees with it. she will call with any problems that may develop before the next visit here. ?Total time spent: 30 mins including face to face time and time spent for planning, charting and co-ordination of care ? ? Suzzette Righter, CMA ?11/01/21 ? ? ? I Medha Pippen, Trayquan Kolakowski am scribing for Dr. Lindi Adie ? ?***  ?

## 2021-11-10 ENCOUNTER — Ambulatory Visit: Payer: BC Managed Care – PPO | Admitting: Hematology and Oncology

## 2021-12-12 ENCOUNTER — Encounter: Payer: Self-pay | Admitting: Hematology and Oncology

## 2022-01-03 ENCOUNTER — Encounter (HOSPITAL_COMMUNITY): Payer: Self-pay

## 2022-01-26 ENCOUNTER — Other Ambulatory Visit: Payer: Self-pay | Admitting: Hematology and Oncology

## 2022-01-26 DIAGNOSIS — N6321 Unspecified lump in the left breast, upper outer quadrant: Secondary | ICD-10-CM

## 2022-02-24 ENCOUNTER — Other Ambulatory Visit: Payer: Self-pay | Admitting: *Deleted

## 2022-02-24 DIAGNOSIS — C50512 Malignant neoplasm of lower-outer quadrant of left female breast: Secondary | ICD-10-CM

## 2022-02-24 NOTE — Progress Notes (Signed)
[employer] testing request re submitted for continuation.

## 2022-03-13 ENCOUNTER — Ambulatory Visit: Payer: BC Managed Care – PPO

## 2022-03-13 ENCOUNTER — Ambulatory Visit
Admission: RE | Admit: 2022-03-13 | Discharge: 2022-03-13 | Disposition: A | Discharge status: Home / Self Care | Payer: BC Managed Care – PPO | Source: Ambulatory Visit | Attending: Hematology and Oncology | Admitting: Hematology and Oncology

## 2022-03-13 DIAGNOSIS — N6321 Unspecified lump in the left breast, upper outer quadrant: Secondary | ICD-10-CM

## 2022-03-16 ENCOUNTER — Telehealth: Payer: Self-pay

## 2022-03-16 NOTE — Telephone Encounter (Signed)
Pt returned call to [facility] per previous phone encounter. Results reviewed with pt. She verbalized thanks and understanding.

## 2022-03-16 NOTE — Telephone Encounter (Signed)
Attempted to call pt to give results to [employer] testing. Test results show negative; not detected. She will be retested in 3 mos per protocol. LVM for call back so we can give details and results.

## 2022-03-17 ENCOUNTER — Encounter: Payer: Self-pay | Admitting: Hematology and Oncology

## 2022-03-22 LAB — SIGNATERA ONLY (NATERA MANAGED)
SIGNATERA MTM READOUT: 0 MTM/ml
SIGNATERA TEST RESULT: NEGATIVE

## 2022-03-24 LAB — SIGNATERA
SIGNATERA MTM READOUT: 0 MTM/ml
SIGNATERA TEST RESULT: NEGATIVE

## 2022-05-27 NOTE — Progress Notes (Signed)
Patient Care Team: Vania Rea, MD as PCP - General (Obstetrics and Gynecology) Nicholas Lose, MD as Consulting Physician (Hematology and Oncology) Eppie Gibson, MD as Attending Physician (Radiation Oncology) Rolm Bookbinder, MD as Consulting Physician (General Surgery)  DIAGNOSIS:  Encounter Diagnosis  Name Primary?   Malignant neoplasm of lower-outer quadrant of left breast of female, estrogen receptor positive ([place]) Yes    SUMMARY OF ONCOLOGIC HISTORY: Oncology History  Malignant neoplasm of lower-outer quadrant of left breast of female, estrogen receptor positive ([place])  09/10/2020 Initial Diagnosis   Screening mammogram showed a possible left breast mass, palpable on exam. Diagnostic mammogram and US showed a 1.2cm mass at the 5 o'clock position and no abnormal axillary lymph nodes. Biopsy showed invasive mammary carcinoma, grade 1-2, HER-2 equivocal by IHC (2+), negative by FISH (ratio 1.36), ER+>95%, PR+ 90%, Ki67 5%. B   09/10/2020 Cancer Staging   Staging form: Breast, AJCC 8th Edition - Clinical stage from 09/10/2020: Stage IB (cT2, cN0, cM0, G2, ER+, PR+, HER2-) - Signed by Gardenia Phlegm, NP on 09/22/2020 Stage prefix: Initial diagnosis Histologic grading system: 3 grade system   09/16/2020 Breast MRI   0.6cm mass in the upper outer quadrant, non-mass enhancement in the low outer quadrant, and the known malignancy spanning 2.1cm in the left breast with no evidence of right breast malignancy.    Genetic Testing   Negative genetic testing. No pathogenic variants identified on the [facility] STAT+Multi-Cancer Panel. The report date is 10/04/2020.  The STAT Breast cancer panel offered by [facility] includes sequencing and rearrangement analysis for the following 9 genes:  ATM, BRCA1, BRCA2, CDH1, CHEK2, PALB2, PTEN, STK11 and TP53.    The Multi-Cancer Panel + RNA offered by [facility] includes sequencing and/or deletion duplication testing of the following 84 genes: AIP,  ALK, APC, ATM, AXIN2,BAP1,  BARD1, BLM, BMPR1A, BRCA1, BRCA2, BRIP1, CASR, CDC73, CDH1, CDK4, CDKN1B, CDKN1C, CDKN2A (p14ARF), CDKN2A (p16INK4a), CEBPA, CHEK2, CTNNA1, DICER1, DIS3L2, EGFR (c.2369C>T, p.Thr790Met variant only), EPCAM (Deletion/duplication testing only), FH, FLCN, GATA2, GPC3, GREM1 (Promoter region deletion/duplication testing only), HOXB13 (c.251G>A, p.Gly84Glu), HRAS, KIT, MAX, MEN1, MET, MITF (c.952G>A, p.Glu318Lys variant only), MLH1, MSH2, MSH3, MSH6, MUTYH, NBN, NF1, NF2, NTHL1, PALB2, PDGFRA, PHOX2B, PMS2, POLD1, POLE, POT1, PRKAR1A, PTCH1, PTEN, RAD50, RAD51C, RAD51D, RB1, RECQL4, RET, RUNX1, SDHAF2, SDHA (sequence changes only), SDHB, SDHC, SDHD, SMAD4, SMARCA4, SMARCB1, SMARCE1, STK11, SUFU, TERC, TERT, TMEM127, TP53, TSC1, TSC2, VHL, WRN and WT1.   10/15/2020 Surgery   Left breast lumpectomy: IDC, grade 2, margins neg, 1 SLN neg; T1c, N0, M0, g2, ER+/PR+/HER-2-   10/15/2020 Oncotype testing   Score 16, ROR 4%   11/04/2020 Cancer Staging   Staging form: Breast, AJCC 8th Edition - Pathologic: Stage IA (pT1c, pN0, cM0, G2, ER+, PR+, HER2-) - Signed by Nicholas Lose, MD on 11/04/2020 Stage prefix: Initial diagnosis Histologic grading system: 3 grade system   11/17/2020 - 12/15/2020 Radiation Therapy   Adjuvant radiation therapy Site Technique Total Dose (Gy) Dose per Fx (Gy) Completed Fx Beam Energies  Breast, Left: Breast_Lt 3D 40.05/40.05 2.67 15/15 6XFFF  Breast, Left: Breast_Lt_Bst 3D 10/10 2 5/5 6X    03/17/2021 -  Anti-estrogen oral therapy   Declined due to extensive musculoskeletal aches and neuropathic pains     CHIEF COMPLIANT: Follow-up invasive mammary carcinoma  INTERVAL HISTORY: Amy Stephenson is a 56 y.o. female with above-mentioned history of invasive mammary carcinoma of the left breast. She presents to the clinic for a follow-up. She states that she feels  like she got bronchitises. But overall she reports all her other symptoms have improve. She says she  feels like she is in a better place from last year.    ALLERGIES:  is allergic to penicillins, augmentin [amoxicillin-pot clavulanate], and sulfa antibiotics.  MEDICATIONS:  Current Outpatient Medications  Medication Sig Dispense Refill   azithromycin (ZITHROMAX Z-PAK) 250 MG tablet Use as directed 6 each 0   HYDROcodone bit-homatropine (HYCODAN) 5-1.5 MG/5ML syrup Take 5 mLs by mouth every 6 (six) hours as needed for cough. 120 mL 0   escitalopram (LEXAPRO) 10 MG tablet Take 1 tablet (10 mg total) by mouth daily. Needs ov. Thanks. 90 tablet 0   gabapentin (NEURONTIN) 300 MG capsule Take 1 capsule (300 mg total) by mouth at bedtime. (Patient not taking: Reported on 04/15/2021) 90 capsule 3   glipiZIDE (GLUCOTROL XL) 5 MG 24 hr tablet Take 5 mg by mouth daily with breakfast.     MULTIPLE VITAMIN PO Take 0.5 tablets by mouth.      SitaGLIPtin-MetFORMIN HCl [phone] MG TB24 Janumet XR 100 mg-1,000 mg tablet,extended release     zolpidem (AMBIEN) 10 MG tablet zolpidem 10 mg tablet     No current facility-administered medications for this visit.    PHYSICAL EXAMINATION: ECOG PERFORMANCE STATUS: 1 - Symptomatic but completely ambulatory  Vitals:   06/02/22 0913  BP: 117/81  Pulse: 81  Resp: 16  Temp: (!) 97.5 F (36.4 C)  SpO2: 100%   Filed Weights   06/02/22 0913  Weight: 148 lb 11.2 oz (67.4 kg)     LABORATORY DATA:  I have reviewed the data as listed    Latest Ref Rng & Units 07/26/2021    9:29 AM 10/06/2020    2:30 PM  CMP  Glucose 70 - 99 mg/dL 187  150   BUN 6 - 20 mg/dL 25  18   Creatinine 0.44 - 1.00 mg/dL 0.70  0.66   Sodium 135 - 145 mmol/L 138  138   Potassium 3.5 - 5.1 mmol/L 4.0  4.0   Chloride 98 - 111 mmol/L 104  100   CO2 22 - 32 mmol/L 28  30   Calcium 8.9 - 10.3 mg/dL 9.5  9.7   Total Protein 6.5 - 8.1 g/dL 7.0    Total Bilirubin 0.3 - 1.2 mg/dL 0.5    Alkaline Phos 38 - 126 U/L 74    AST 15 - 41 U/L 14    ALT 0 - 44 U/L 15      Lab Results   Component Value Date   WBC 6.4 06/02/2022   HGB 13.2 06/02/2022   HCT 40.6 06/02/2022   MCV 90.2 06/02/2022   PLT 271 06/02/2022   NEUTROABS 3.5 06/02/2022    ASSESSMENT & PLAN:  Malignant neoplasm of lower-outer quadrant of left breast of female, estrogen receptor positive ([place]) 09/10/2020: Screening mammogram detected left breast mass later palpable, 1.2 cm at 5 o'clock position, axilla negative, biopsy: Grade 1-2 IDC, ER greater than 95%, PR 90%, Ki-67 5%, HER-2 negative ratio 1.36   09/16/2020: Breast MRI: 0.6 cm mass UOQ, non-mass enhancement LOQ spanning 2.1 cm, MRI guided biopsy is recommended for the indeterminate mass in the non-mass enhancement. 10/15/20: Left Lumpectomy: 1.9 cm Grade 2 IDC , Margins Neg, ER greater than 95%, PR 90%, Ki-67 5%, HER-2 negative ratio 1.36   Oncotype Dx: Score 16, ROR 4% Adj XRT 11/18/20- 12/15/20   Bone density: -2.7: Osteoporosis   Surveillance:  1.  Breast exam 06/02/2022: Benign 2. mammogram left breast 03/13/2022: Benign breast density category C 3. Breast MRI: 04/27/2021: Benign breast density category C  breast MRI every 2 to 3 years as needed. 4.  [facility] lab test for minimal residual disease: Negative. 5.  PET CT scan 08/06/2021: No metastatic disease   Diffuse pains: Especially in the left hip radiating down the leg, pain in the chest pain in the back pain in the hips.  Markedly improved over the past year.  Patient made a decision to not receive antiestrogen therapy because of low risk Oncotype score as well as her concern for side effects.   I encouraged her to take 5000 international units of B12 sublingually daily.  Return to clinic in 1 year for follow-up    No orders of the defined types were placed in this encounter.  The patient has a good understanding of the overall plan. she agrees with it. she will call with any problems that may develop before the next visit here. Total time spent: 30 mins including face to face time  and time spent for planning, charting and co-ordination of care   Harriette Ohara, MD 06/02/22

## 2022-06-01 ENCOUNTER — Encounter: Payer: Self-pay | Admitting: Hematology and Oncology

## 2022-06-01 ENCOUNTER — Other Ambulatory Visit: Payer: Self-pay | Admitting: *Deleted

## 2022-06-01 DIAGNOSIS — Z170 Estrogen receptor positive status [ER+]: Secondary | ICD-10-CM

## 2022-06-02 ENCOUNTER — Other Ambulatory Visit: Payer: Self-pay | Admitting: *Deleted

## 2022-06-02 ENCOUNTER — Inpatient Hospital Stay: Payer: BC Managed Care – PPO

## 2022-06-02 ENCOUNTER — Inpatient Hospital Stay: Payer: BC Managed Care – PPO | Attending: Hematology and Oncology | Admitting: Hematology and Oncology

## 2022-06-02 ENCOUNTER — Other Ambulatory Visit: Payer: Self-pay

## 2022-06-02 VITALS — BP 117/81 | HR 81 | Temp 97.5°F | Resp 16 | Wt 148.7 lb

## 2022-06-02 DIAGNOSIS — M25552 Pain in left hip: Secondary | ICD-10-CM | POA: Diagnosis not present

## 2022-06-02 DIAGNOSIS — N6321 Unspecified lump in the left breast, upper outer quadrant: Secondary | ICD-10-CM | POA: Insufficient documentation

## 2022-06-02 DIAGNOSIS — C50512 Malignant neoplasm of lower-outer quadrant of left female breast: Secondary | ICD-10-CM | POA: Diagnosis present

## 2022-06-02 DIAGNOSIS — Z882 Allergy status to sulfonamides status: Secondary | ICD-10-CM | POA: Insufficient documentation

## 2022-06-02 DIAGNOSIS — Z170 Estrogen receptor positive status [ER+]: Secondary | ICD-10-CM | POA: Insufficient documentation

## 2022-06-02 DIAGNOSIS — Z880 Allergy status to penicillin: Secondary | ICD-10-CM | POA: Insufficient documentation

## 2022-06-02 DIAGNOSIS — R079 Chest pain, unspecified: Secondary | ICD-10-CM | POA: Insufficient documentation

## 2022-06-02 DIAGNOSIS — M549 Dorsalgia, unspecified: Secondary | ICD-10-CM | POA: Diagnosis not present

## 2022-06-02 DIAGNOSIS — Z79899 Other long term (current) drug therapy: Secondary | ICD-10-CM | POA: Diagnosis not present

## 2022-06-02 DIAGNOSIS — Z881 Allergy status to other antibiotic agents status: Secondary | ICD-10-CM | POA: Insufficient documentation

## 2022-06-02 LAB — CBC WITH DIFFERENTIAL (CANCER CENTER ONLY)
Abs Immature Granulocytes: 0.02 10*3/uL (ref 0.00–0.07)
Basophils Absolute: 0 10*3/uL (ref 0.0–0.1)
Basophils Relative: 1 %
Eosinophils Absolute: 0.2 10*3/uL (ref 0.0–0.5)
Eosinophils Relative: 3 %
HCT: 40.6 % (ref 36.0–46.0)
Hemoglobin: 13.2 g/dL (ref 12.0–15.0)
Immature Granulocytes: 0 %
Lymphocytes Relative: 39 %
Lymphs Abs: 2.5 10*3/uL (ref 0.7–4.0)
MCH: 29.3 pg (ref 26.0–34.0)
MCHC: 32.5 g/dL (ref 30.0–36.0)
MCV: 90.2 fL (ref 80.0–100.0)
Monocytes Absolute: 0.3 10*3/uL (ref 0.1–1.0)
Monocytes Relative: 5 %
Neutro Abs: 3.5 10*3/uL (ref 1.7–7.7)
Neutrophils Relative %: 52 %
Platelet Count: 271 10*3/uL (ref 150–400)
RBC: 4.5 MIL/uL (ref 3.87–5.11)
RDW: 13.3 % (ref 11.5–15.5)
WBC Count: 6.4 10*3/uL (ref 4.0–10.5)
nRBC: 0 % (ref 0.0–0.2)

## 2022-06-02 LAB — LIPID PANEL
Cholesterol: 201 mg/dL — ABNORMAL HIGH (ref 0–200)
HDL: 60 mg/dL (ref >40)
LDL Cholesterol: 111 mg/dL — ABNORMAL HIGH (ref 0–99)
Total CHOL/HDL Ratio: 3.4 RATIO
Triglycerides: 148 mg/dL (ref <150)
VLDL: 30 mg/dL (ref 0–40)

## 2022-06-02 LAB — CMP (CANCER CENTER ONLY)
ALT: 13 U/L (ref 0–44)
AST: 14 U/L — ABNORMAL LOW (ref 15–41)
Albumin: 4.6 g/dL (ref 3.5–5.0)
Alkaline Phosphatase: 78 U/L (ref 38–126)
Anion gap: 6 (ref 5–15)
BUN: 24 mg/dL — ABNORMAL HIGH (ref 6–20)
CO2: 28 mmol/L (ref 22–32)
Calcium: 9.6 mg/dL (ref 8.9–10.3)
Chloride: 104 mmol/L (ref 98–111)
Creatinine: 0.56 mg/dL (ref 0.44–1.00)
GFR, Estimated: >60 mL/min (ref >60)
Glucose, Bld: 84 mg/dL (ref 70–99)
Potassium: 3.8 mmol/L (ref 3.5–5.1)
Sodium: 138 mmol/L (ref 135–145)
Total Bilirubin: 0.3 mg/dL (ref 0.3–1.2)
Total Protein: 7.4 g/dL (ref 6.5–8.1)

## 2022-06-02 LAB — VITAMIN D 25 HYDROXY (VIT D DEFICIENCY, FRACTURES): Vit D, 25-Hydroxy: 30.76 ng/mL (ref 30–100)

## 2022-06-02 MED ORDER — AZITHROMYCIN 250 MG PO TABS: ORAL_TABLET | ORAL | 0 refills | Status: DC

## 2022-06-02 MED ORDER — HYDROCODONE BIT-HOMATROP MBR 5-1.5 MG/5ML PO SOLN: 5.0000 mL | Freq: Four times a day (QID) | ORAL | 0 refills | Status: DC | PRN

## 2022-06-02 NOTE — Assessment & Plan Note (Signed)
09/10/2020: Screening mammogram detected left breast mass later palpable, 1.2 cm at 5 o'clock position, axilla negative, biopsy: Grade 1-2 IDC, ER greater than 95%, PR 90%, Ki-67 5%, HER-2 negative ratio 1.36   09/16/2020: Breast MRI: 0.6 cm mass UOQ, non-mass enhancement LOQ spanning 2.1 cm, MRI guided biopsy is recommended for the indeterminate mass in the non-mass enhancement. 10/15/20: Left Lumpectomy: 1.9 cm Grade 2 IDC , Margins Neg, ER greater than 95%, PR 90%, Ki-67 5%, HER-2 negative ratio 1.36   Oncotype Dx: Score 16, ROR 4% Adj XRT 11/18/20- 12/15/20   Bone density: -2.7: Osteoporosis   Surveillance:  1.  Breast exam 06/02/2022: Benign 2. mammogram left breast 03/13/2022: Benign breast density category C 3. Breast MRI: 04/27/2021: Benign breast density category C  breast MRI every 2 to 3 years as needed. 4.  [facility] lab test for minimal residual disease: Negative. 5.  PET CT scan 08/06/2021: No metastatic disease   Diffuse pains: Especially in the left hip radiating down the leg, pain in the chest pain in the back pain in the hips    I encouraged her to take 5000 international units of B12 sublingually daily.  Return to clinic in 1 year for follow-up

## 2022-06-12 LAB — SIGNATERA
SIGNATERA MTM READOUT: 0 MTM/ml
SIGNATERA TEST RESULT: NEGATIVE

## 2022-06-22 NOTE — Telephone Encounter (Signed)
Called pt per MD to advise [employer] testing was negative/not detected. Pt verbalized understanding of results and knows [facility] will be in touch to schedule 3 mo repeat lab.   

## 2022-07-18 ENCOUNTER — Other Ambulatory Visit: Payer: Self-pay | Admitting: Obstetrics & Gynecology

## 2022-07-18 ENCOUNTER — Encounter: Payer: Self-pay | Admitting: Hematology and Oncology

## 2022-07-18 ENCOUNTER — Other Ambulatory Visit: Payer: Self-pay | Admitting: Hematology and Oncology

## 2022-07-18 ENCOUNTER — Other Ambulatory Visit: Payer: Self-pay | Admitting: *Deleted

## 2022-07-18 DIAGNOSIS — N6320 Unspecified lump in the left breast, unspecified quadrant: Secondary | ICD-10-CM

## 2022-07-18 DIAGNOSIS — C50512 Malignant neoplasm of lower-outer quadrant of left female breast: Secondary | ICD-10-CM

## 2022-07-18 NOTE — Progress Notes (Signed)
Received message from pt request breast US orders be placed incase they are needed based on upcoming mammogram results.  Orders placed.

## 2022-08-28 ENCOUNTER — Other Ambulatory Visit: Payer: Self-pay | Admitting: Hematology and Oncology

## 2022-08-28 ENCOUNTER — Ambulatory Visit
Admission: RE | Admit: 2022-08-28 | Discharge: 2022-08-28 | Disposition: A | Discharge status: Home / Self Care | Payer: BC Managed Care – PPO | Source: Ambulatory Visit | Attending: Hematology and Oncology | Admitting: Hematology and Oncology

## 2022-08-28 DIAGNOSIS — N6320 Unspecified lump in the left breast, unspecified quadrant: Secondary | ICD-10-CM

## 2022-09-08 ENCOUNTER — Telehealth: Payer: Self-pay | Admitting: *Deleted

## 2022-09-08 LAB — SIGNATERA
SIGNATERA MTM READOUT: 0 MTM/ml
SIGNATERA TEST RESULT: NEGATIVE

## 2022-09-08 NOTE — Telephone Encounter (Signed)
Per MD request, RN placed call to pt regarding negative (Not Detected) recent [employer] testing.  No answer, LVM for pt to return call to the office. 

## 2022-12-17 LAB — SIGNATERA
SIGNATERA MTM READOUT: 0 MTM/ml
SIGNATERA TEST RESULT: NEGATIVE

## 2022-12-20 ENCOUNTER — Telehealth: Payer: Self-pay

## 2022-12-20 NOTE — Telephone Encounter (Signed)
Called pt per MD to advise [employer] testing was negative/not detected. Pt verbalized understanding of results and knows [facility] will be in touch to schedule 3 mo repeat lab.   

## 2023-02-05 ENCOUNTER — Encounter: Payer: Self-pay | Admitting: Hematology and Oncology

## 2023-02-05 ENCOUNTER — Other Ambulatory Visit: Payer: Self-pay | Admitting: Hematology and Oncology

## 2023-02-05 ENCOUNTER — Other Ambulatory Visit: Payer: Self-pay

## 2023-02-05 DIAGNOSIS — Z170 Estrogen receptor positive status [ER+]: Secondary | ICD-10-CM

## 2023-03-07 ENCOUNTER — Ambulatory Visit
Admission: RE | Admit: 2023-03-07 | Discharge: 2023-03-07 | Disposition: A | Discharge status: Home / Self Care | Payer: BC Managed Care – PPO | Source: Ambulatory Visit | Attending: Hematology and Oncology | Admitting: Hematology and Oncology

## 2023-03-07 ENCOUNTER — Other Ambulatory Visit: Payer: Self-pay | Admitting: *Deleted

## 2023-03-07 ENCOUNTER — Telehealth: Payer: Self-pay | Admitting: *Deleted

## 2023-03-07 DIAGNOSIS — Z170 Estrogen receptor positive status [ER+]: Secondary | ICD-10-CM

## 2023-03-07 DIAGNOSIS — R928 Other abnormal and inconclusive findings on diagnostic imaging of breast: Secondary | ICD-10-CM

## 2023-03-07 NOTE — Telephone Encounter (Signed)
This RN spoke with pt per her call stating [facility] with noted areas in Breast of concern per MM and [place].  She is scheduled to see Dr Rondel Baton 10/30 for surgical consult.  She is asking to have MRI for additional evaluation for review for potential surgery.  Of note her daughter is getting married so she is hoping to get this done as as possible.  Per review of chart and MD dictation stating MRI's of breast as needed / as well as readings on scans today this RN placed order for MRI of breast.  Informed pt order has been placed.

## 2023-03-30 NOTE — Telephone Encounter (Signed)
Called pt per MD to advise [employer] testing was negative/not detected. Pt verbalized understanding of results and knows [facility] will be in touch to schedule 3 mo repeat lab.   

## 2023-04-02 ENCOUNTER — Encounter: Payer: Self-pay | Admitting: Hematology and Oncology

## 2023-04-15 ENCOUNTER — Ambulatory Visit
Admission: RE | Admit: 2023-04-15 | Discharge: 2023-04-15 | Disposition: A | Discharge status: Home / Self Care | Payer: BC Managed Care – PPO | Source: Ambulatory Visit | Attending: Hematology and Oncology | Admitting: Hematology and Oncology

## 2023-04-15 DIAGNOSIS — R928 Other abnormal and inconclusive findings on diagnostic imaging of breast: Secondary | ICD-10-CM

## 2023-04-15 DIAGNOSIS — C50512 Malignant neoplasm of lower-outer quadrant of left female breast: Secondary | ICD-10-CM

## 2023-04-15 MED ORDER — GADOPICLENOL 0.5 MMOL/ML IV SOLN
7.0000 mL | Freq: Once | INTRAVENOUS | Status: AC | PRN
  Administered 2023-04-15: 7 mL via INTRAVENOUS

## 2023-04-16 ENCOUNTER — Encounter: Payer: Self-pay | Admitting: Hematology and Oncology

## 2023-04-23 ENCOUNTER — Ambulatory Visit: Payer: BC Managed Care – PPO | Admitting: Hematology and Oncology

## 2023-04-23 ENCOUNTER — Other Ambulatory Visit: Payer: Self-pay | Admitting: General Surgery

## 2023-04-23 DIAGNOSIS — N6321 Unspecified lump in the left breast, upper outer quadrant: Secondary | ICD-10-CM

## 2023-04-25 ENCOUNTER — Encounter (HOSPITAL_BASED_OUTPATIENT_CLINIC_OR_DEPARTMENT_OTHER): Payer: Self-pay | Admitting: General Surgery

## 2023-04-25 ENCOUNTER — Other Ambulatory Visit: Payer: Self-pay

## 2023-04-26 ENCOUNTER — Encounter (HOSPITAL_BASED_OUTPATIENT_CLINIC_OR_DEPARTMENT_OTHER)
Admission: RE | Admit: 2023-04-26 | Discharge: 2023-04-26 | Disposition: A | Discharge status: Home / Self Care | Payer: BC Managed Care – PPO | Source: Ambulatory Visit | Attending: General Surgery | Admitting: General Surgery

## 2023-04-26 DIAGNOSIS — Z01818 Encounter for other preprocedural examination: Secondary | ICD-10-CM | POA: Insufficient documentation

## 2023-04-26 LAB — HEMOGLOBIN A1C
Hgb A1c MFr Bld: 6.3 % — ABNORMAL HIGH (ref 4.8–5.6)
Mean Plasma Glucose: 134.11 mg/dL

## 2023-04-26 LAB — BASIC METABOLIC PANEL
Anion gap: 8 (ref 5–15)
BUN: 13 mg/dL (ref 6–20)
CO2: 27 mmol/L (ref 22–32)
Calcium: 9.5 mg/dL (ref 8.9–10.3)
Chloride: 104 mmol/L (ref 98–111)
Creatinine, Ser: 0.65 mg/dL (ref 0.44–1.00)
GFR, Estimated: >60 mL/min (ref >60)
Glucose, Bld: 108 mg/dL — ABNORMAL HIGH (ref 70–99)
Potassium: 4.6 mmol/L (ref 3.5–5.1)
Sodium: 139 mmol/L (ref 135–145)

## 2023-04-26 MED ORDER — ENSURE PRE-SURGERY PO LIQD: 296.0000 mL | Freq: Once | ORAL | Status: DC

## 2023-04-26 MED ORDER — CHLORHEXIDINE GLUCONATE CLOTH 2 % EX PADS: 6.0000 | MEDICATED_PAD | Freq: Once | CUTANEOUS | Status: DC

## 2023-04-26 NOTE — Progress Notes (Signed)

## 2023-05-04 ENCOUNTER — Ambulatory Visit
Admission: RE | Admit: 2023-05-04 | Discharge: 2023-05-04 | Disposition: A | Discharge status: Home / Self Care | Payer: BC Managed Care – PPO | Source: Ambulatory Visit | Attending: General Surgery | Admitting: General Surgery

## 2023-05-04 DIAGNOSIS — N6321 Unspecified lump in the left breast, upper outer quadrant: Secondary | ICD-10-CM

## 2023-05-04 HISTORY — PX: BREAST BIOPSY: SHX20

## 2023-05-07 ENCOUNTER — Ambulatory Visit (HOSPITAL_BASED_OUTPATIENT_CLINIC_OR_DEPARTMENT_OTHER): Payer: BC Managed Care – PPO | Admitting: Anesthesiology

## 2023-05-07 ENCOUNTER — Other Ambulatory Visit: Payer: Self-pay

## 2023-05-07 ENCOUNTER — Encounter (HOSPITAL_BASED_OUTPATIENT_CLINIC_OR_DEPARTMENT_OTHER)
Admission: RE | Disposition: A | Discharge status: Home / Self Care | Payer: Self-pay | Source: Home / Self Care | Attending: General Surgery

## 2023-05-07 ENCOUNTER — Encounter (HOSPITAL_BASED_OUTPATIENT_CLINIC_OR_DEPARTMENT_OTHER): Payer: Self-pay | Admitting: General Surgery

## 2023-05-07 ENCOUNTER — Ambulatory Visit (HOSPITAL_BASED_OUTPATIENT_CLINIC_OR_DEPARTMENT_OTHER)
Admission: RE | Admit: 2023-05-07 | Discharge: 2023-05-07 | Disposition: A | Discharge status: Home / Self Care | Payer: BC Managed Care – PPO | Attending: General Surgery | Admitting: General Surgery

## 2023-05-07 ENCOUNTER — Ambulatory Visit
Admission: RE | Admit: 2023-05-07 | Discharge: 2023-05-07 | Disposition: A | Discharge status: Home / Self Care | Payer: BC Managed Care – PPO | Source: Ambulatory Visit | Attending: General Surgery | Admitting: General Surgery

## 2023-05-07 DIAGNOSIS — Z853 Personal history of malignant neoplasm of breast: Secondary | ICD-10-CM | POA: Diagnosis not present

## 2023-05-07 DIAGNOSIS — Z7984 Long term (current) use of oral hypoglycemic drugs: Secondary | ICD-10-CM | POA: Diagnosis not present

## 2023-05-07 DIAGNOSIS — F419 Anxiety disorder, unspecified: Secondary | ICD-10-CM | POA: Insufficient documentation

## 2023-05-07 DIAGNOSIS — E119 Type 2 diabetes mellitus without complications: Secondary | ICD-10-CM | POA: Insufficient documentation

## 2023-05-07 DIAGNOSIS — N6321 Unspecified lump in the left breast, upper outer quadrant: Secondary | ICD-10-CM

## 2023-05-07 DIAGNOSIS — N6039 Fibrosclerosis of unspecified breast: Secondary | ICD-10-CM | POA: Diagnosis not present

## 2023-05-07 DIAGNOSIS — R928 Other abnormal and inconclusive findings on diagnostic imaging of breast: Secondary | ICD-10-CM | POA: Diagnosis present

## 2023-05-07 HISTORY — PX: RADIOACTIVE SEED GUIDED EXCISIONAL BREAST BIOPSY: SHX6490

## 2023-05-07 LAB — GLUCOSE, CAPILLARY
Glucose-Capillary: 111 mg/dL — ABNORMAL HIGH (ref 70–99)
Glucose-Capillary: 141 mg/dL — ABNORMAL HIGH (ref 70–99)

## 2023-05-07 SURGERY — RADIOACTIVE SEED GUIDED BREAST BIOPSY
Anesthesia: General | Site: Breast | Laterality: Left

## 2023-05-07 MED ORDER — CIPROFLOXACIN IN D5W 400 MG/200ML IV SOLN
400.0000 mg | INTRAVENOUS | Status: AC
  Administered 2023-05-07: 400 mg via INTRAVENOUS

## 2023-05-07 MED ORDER — DEXAMETHASONE SODIUM PHOSPHATE 4 MG/ML IJ SOLN
INTRAMUSCULAR | Status: DC | PRN
  Administered 2023-05-07: 4 mg via INTRAVENOUS

## 2023-05-07 MED ORDER — DEXAMETHASONE SODIUM PHOSPHATE 10 MG/ML IJ SOLN
INTRAMUSCULAR | Status: AC
  Filled 2023-05-07: qty 1

## 2023-05-07 MED ORDER — AMISULPRIDE (ANTIEMETIC) 5 MG/2ML IV SOLN: 10.0000 mg | Freq: Once | INTRAVENOUS | Status: DC | PRN

## 2023-05-07 MED ORDER — HYDROMORPHONE HCL 1 MG/ML IJ SOLN
0.2500 mg | INTRAMUSCULAR | Status: DC | PRN
  Administered 2023-05-07: 0.25 mg via INTRAVENOUS

## 2023-05-07 MED ORDER — ACETAMINOPHEN 500 MG PO TABS
1000.0000 mg | ORAL_TABLET | ORAL | Status: AC
  Administered 2023-05-07: 1000 mg via ORAL

## 2023-05-07 MED ORDER — BUPIVACAINE HCL (PF) 0.25 % IJ SOLN
INTRAMUSCULAR | Status: AC
  Filled 2023-05-07: qty 30

## 2023-05-07 MED ORDER — FENTANYL CITRATE (PF) 100 MCG/2ML IJ SOLN
INTRAMUSCULAR | Status: DC | PRN
  Administered 2023-05-07: 50 ug via INTRAVENOUS

## 2023-05-07 MED ORDER — OXYCODONE HCL 5 MG/5ML PO SOLN: 5.0000 mg | Freq: Once | ORAL | Status: AC | PRN

## 2023-05-07 MED ORDER — ACETAMINOPHEN 500 MG PO TABS
ORAL_TABLET | ORAL | Status: AC
  Filled 2023-05-07: qty 2

## 2023-05-07 MED ORDER — HYDROMORPHONE HCL 1 MG/ML IJ SOLN
INTRAMUSCULAR | Status: AC
  Filled 2023-05-07: qty 0.5

## 2023-05-07 MED ORDER — CIPROFLOXACIN IN D5W 400 MG/200ML IV SOLN
INTRAVENOUS | Status: AC
  Filled 2023-05-07: qty 200

## 2023-05-07 MED ORDER — ONDANSETRON HCL 4 MG/2ML IJ SOLN: 4.0000 mg | Freq: Once | INTRAMUSCULAR | Status: DC | PRN

## 2023-05-07 MED ORDER — ACETAMINOPHEN 500 MG PO TABS: 1000.0000 mg | ORAL_TABLET | Freq: Once | ORAL | Status: DC

## 2023-05-07 MED ORDER — LIDOCAINE 2% (20 MG/ML) 5 ML SYRINGE
INTRAMUSCULAR | Status: AC
  Filled 2023-05-07: qty 5

## 2023-05-07 MED ORDER — ONDANSETRON HCL 4 MG/2ML IJ SOLN
INTRAMUSCULAR | Status: DC | PRN
  Administered 2023-05-07: 4 mg via INTRAVENOUS

## 2023-05-07 MED ORDER — BUPIVACAINE HCL (PF) 0.25 % IJ SOLN
INTRAMUSCULAR | Status: DC | PRN
  Administered 2023-05-07: 10 mL

## 2023-05-07 MED ORDER — PHENYLEPHRINE HCL (PRESSORS) 10 MG/ML IV SOLN
INTRAVENOUS | Status: DC | PRN
  Administered 2023-05-07: 80 ug via INTRAVENOUS

## 2023-05-07 MED ORDER — MIDAZOLAM HCL 5 MG/5ML IJ SOLN
INTRAMUSCULAR | Status: DC | PRN
  Administered 2023-05-07: 2 mg via INTRAVENOUS

## 2023-05-07 MED ORDER — MIDAZOLAM HCL 2 MG/2ML IJ SOLN
INTRAMUSCULAR | Status: AC
Start: 2023-05-07 — End: ?
  Filled 2023-05-07: qty 2

## 2023-05-07 MED ORDER — ONDANSETRON HCL 4 MG/2ML IJ SOLN
INTRAMUSCULAR | Status: AC
  Filled 2023-05-07: qty 2

## 2023-05-07 MED ORDER — PROPOFOL 500 MG/50ML IV EMUL
INTRAVENOUS | Status: DC | PRN
  Administered 2023-05-07: 150 ug/kg/min via INTRAVENOUS

## 2023-05-07 MED ORDER — SODIUM CHLORIDE 0.9 % IV SOLN: INTRAVENOUS | Status: DC | PRN

## 2023-05-07 MED ORDER — OXYCODONE HCL 5 MG PO TABS
5.0000 mg | ORAL_TABLET | Freq: Once | ORAL | Status: AC | PRN
  Administered 2023-05-07: 5 mg via ORAL

## 2023-05-07 MED ORDER — OXYCODONE HCL 5 MG PO TABS
ORAL_TABLET | ORAL | Status: AC
  Filled 2023-05-07: qty 1

## 2023-05-07 MED ORDER — PROPOFOL 10 MG/ML IV BOLUS
INTRAVENOUS | Status: DC | PRN
  Administered 2023-05-07: 150 mg via INTRAVENOUS

## 2023-05-07 MED ORDER — FENTANYL CITRATE (PF) 100 MCG/2ML IJ SOLN
INTRAMUSCULAR | Status: AC
  Filled 2023-05-07: qty 2

## 2023-05-07 MED ORDER — LIDOCAINE HCL (CARDIAC) PF 100 MG/5ML IV SOSY
PREFILLED_SYRINGE | INTRAVENOUS | Status: DC | PRN
  Administered 2023-05-07: 60 mg via INTRAVENOUS

## 2023-05-07 SURGICAL SUPPLY — 53 items
ADH SKN CLS APL DERMABOND .7 (GAUZE/BANDAGES/DRESSINGS) ×1
APL PRP STRL LF DISP 70% ISPRP (MISCELLANEOUS) ×1
APPLIER CLIP 9.375 MED OPEN (MISCELLANEOUS)
APR CLP MED 9.3 20 MLT OPN (MISCELLANEOUS)
BINDER BREAST LRG (GAUZE/BANDAGES/DRESSINGS) IMPLANT
BINDER BREAST MEDIUM (GAUZE/BANDAGES/DRESSINGS) IMPLANT
BINDER BREAST XLRG (GAUZE/BANDAGES/DRESSINGS) IMPLANT
BLADE SURG 15 STRL LF DISP TIS (BLADE) ×1 IMPLANT
BLADE SURG 15 STRL SS (BLADE) ×1
CANISTER SUC SOCK COL 7IN (MISCELLANEOUS) IMPLANT
CANISTER SUCT 1200ML W/VALVE (MISCELLANEOUS) IMPLANT
CHLORAPREP W/TINT 26 (MISCELLANEOUS) ×1 IMPLANT
CLIP APPLIE 9.375 MED OPEN (MISCELLANEOUS) IMPLANT
CLIP TI WIDE RED SMALL 6 (CLIP) IMPLANT
COVER BACK TABLE 60X90IN (DRAPES) ×1 IMPLANT
COVER MAYO STAND STRL (DRAPES) ×1 IMPLANT
COVER PROBE CYLINDRICAL 5X96 (MISCELLANEOUS) ×1 IMPLANT
DERMABOND ADVANCED .7 DNX12 (GAUZE/BANDAGES/DRESSINGS) ×1 IMPLANT
DRAPE LAPAROSCOPIC ABDOMINAL (DRAPES) ×1 IMPLANT
DRAPE UTILITY XL STRL (DRAPES) ×1 IMPLANT
DRSG TEGADERM 4X4.75 (GAUZE/BANDAGES/DRESSINGS) IMPLANT
ELECT COATED BLADE 2.86 ST (ELECTRODE) ×1 IMPLANT
ELECT REM PT RETURN 9FT ADLT (ELECTROSURGICAL) ×1
ELECTRODE REM PT RTRN 9FT ADLT (ELECTROSURGICAL) ×1 IMPLANT
GAUZE SPONGE 4X4 12PLY STRL LF (GAUZE/BANDAGES/DRESSINGS) IMPLANT
GLOVE BIO SURGEON STRL SZ7 (GLOVE) ×2 IMPLANT
GLOVE BIOGEL PI IND STRL 7.5 (GLOVE) ×1 IMPLANT
GOWN STRL REUS W/ TWL LRG LVL3 (GOWN DISPOSABLE) ×2 IMPLANT
GOWN STRL REUS W/TWL LRG LVL3 (GOWN DISPOSABLE) ×2
HEMOSTAT ARISTA ABSORB 3G PWDR (HEMOSTASIS) IMPLANT
KIT MARKER MARGIN INK (KITS) ×1 IMPLANT
NDL HYPO 25X1 1.5 SAFETY (NEEDLE) ×1 IMPLANT
NEEDLE HYPO 25X1 1.5 SAFETY (NEEDLE) ×1
NS IRRIG 1000ML POUR BTL (IV SOLUTION) IMPLANT
PACK BASIN DAY SURGERY FS (CUSTOM PROCEDURE TRAY) ×1 IMPLANT
PENCIL SMOKE EVACUATOR (MISCELLANEOUS) ×1 IMPLANT
RETRACTOR ONETRAX LX 90X20 (MISCELLANEOUS) IMPLANT
SLEEVE SCD COMPRESS KNEE MED (STOCKING) ×1 IMPLANT
SPIKE FLUID TRANSFER (MISCELLANEOUS) IMPLANT
SPONGE T-LAP 4X18 ~~LOC~~+RFID (SPONGE) ×1 IMPLANT
STRIP CLOSURE SKIN 1/2X4 (GAUZE/BANDAGES/DRESSINGS) ×1 IMPLANT
SUT MNCRL AB 4-0 PS2 18 (SUTURE) IMPLANT
SUT MON AB 5-0 PS2 18 (SUTURE) IMPLANT
SUT SILK 2 0 SH (SUTURE) IMPLANT
SUT VIC AB 2-0 SH 27 (SUTURE) ×1
SUT VIC AB 2-0 SH 27XBRD (SUTURE) ×1 IMPLANT
SUT VIC AB 3-0 SH 27 (SUTURE) ×1
SUT VIC AB 3-0 SH 27X BRD (SUTURE) ×1 IMPLANT
SYR CONTROL 10ML LL (SYRINGE) ×1 IMPLANT
TOWEL GREEN STERILE FF (TOWEL DISPOSABLE) ×1 IMPLANT
TRAY FAXITRON CT DISP (TRAY / TRAY PROCEDURE) ×1 IMPLANT
TUBE CONNECTING 20X1/4 (TUBING) IMPLANT
YANKAUER SUCT BULB TIP NO VENT (SUCTIONS) IMPLANT

## 2023-05-07 NOTE — Transfer of Care (Signed)
Immediate Anesthesia Transfer of Care Note  Patient: Amy Stephenson  Procedure(s) Performed: LEFT BREAST SEED GUIDED EXCISIONAL BIOPSY (Left: Breast)  Patient Location: PACU  Anesthesia Type:General  Level of Consciousness: awake, alert , and oriented  Airway & Oxygen Therapy: Patient Spontanous Breathing  Post-op Assessment: Report given to RN and Post -op Vital signs reviewed and stable  Post vital signs: Reviewed and stable  Last Vitals:  Vitals Value Taken Time  BP 87/60 05/07/23 1315  Temp    Pulse 69 05/07/23 1316  Resp    SpO2 97 % 05/07/23 1316  Vitals shown include unfiled device data.  Last Pain:  Vitals:   05/07/23 1102  TempSrc: Tympanic  PainSc: 0-No pain      Patients Stated Pain Goal: 5 (05/07/23 1102)  Complications: No notable events documented.

## 2023-05-07 NOTE — H&P (Signed)
57 y.o. female who is seen today for breast cancer follow-up.In April 2022 she underwent a left breast lumpectomy as well as a sentinel lymph node biopsy. 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 The tumor was a grade 2 and is 1.9 cm in size. She subsequently underwent Oncotype which was 16. She did radiation in May and June 2022. she has not done antiestrogen. She does have good range of motion and no swelling in her arm. MM 9/24 had shown persistent distortion and MRI does not show this area. Has been told by couple radiologist to have this removed although final MR does not give that recommendation. She is here to discuss options  Review of Systems: A complete review of systems was obtained from the patient. I have reviewed this information and discussed as appropriate with the patient. See HPI as well for other ROS.  Review of Systems  All other systems reviewed and are negative.  Medical History: Past Medical History:  Diagnosis Date  Arthritis  Breast cancer (CMS/HHS-HCC) 08/2020  Osteoporosis  DEXA scan 12/2020  Type 2 diabetes mellitus (CMS/HHS-HCC)   Patient Active Problem List  Diagnosis  Type 2 diabetes mellitus without complication (CMS/HHS-HCC)  Acute sinusitis  Anxiety  Bilateral impacted cerumen  Candidal vulvovaginitis  Cervical radiculopathy  Cervical spondylosis  Degeneration of cervical intervertebral disc  Diabetes mellitus (CMS/HHS-HCC)  Family history of breast cancer  Family history of malignant neoplasm of digestive organs  Family history of prostate cancer  Genetic testing  Glycosuria  Herpes labialis  Insomnia  Malignant neoplasm of lower-outer quadrant of left breast of female, estrogen receptor positive (CMS/HHS-HCC)  Neck pain  Lymphadenitis  Pharyngitis  Referred otalgia of left ear  Throat pain   Past Surgical History:  Procedure Laterality Date  CESAREAN SECTION  CESAREAN SECTION  CHOLECYSTECTOMY OPEN  DEEP AXILLARY SENTINEL  NODE BIOPSY / EXCISION Left  MASTECTOMY PARTIAL / LUMPECTOMY Left   Allergies  Allergen Reactions  Penicillins Other (See Comments), Hives, Itching, Rash and Swelling  Hands  Augmentin [Amoxicillin-Pot Clavulanate] Unknown  Sulfa (Sulfonamide Antibiotics) Unknown, Rash and Swelling   Current Outpatient Medications on File Prior to Visit  Medication Sig Dispense Refill  ALPRAZolam (XANAX) 0.25 MG tablet Take 0.25 mg by mouth continuously as needed.  dapagliflozin-metFORMIN (XIGDUO XR) 10-1,000 mg XR 24 hr biphasic tablet Take 1 tablet by mouth once daily 90 tablet 3  escitalopram oxalate (LEXAPRO) 10 MG tablet Take 10 mg by mouth once daily. 2  glipiZIDE (GLUCOTROL XL) 5 MG XL tablet take 1 tablet by mouth every day 90 tablet 3  metFORMIN (GLUCOPHAGE-XR) 500 MG XR tablet Take 1 tablet (500 mg total) by mouth 2 (two) times daily 180 tablet 3  multivitamin tablet Take 1 tablet by mouth once daily.  zolpidem (AMBIEN) 10 mg tablet Take 5 mg by mouth continuously as needed.   Family History  Problem Relation Age of Onset  High blood pressure (Hypertension) Mother  Kidney disease Mother  Breast cancer Mother  Alzheimer's disease Mother  Diabetes type II Father  Colon cancer Father   Social History   Tobacco Use  Smoking Status Never  Smokeless Tobacco Never  Marital status: Married  Tobacco Use  Smoking status: Never  Smokeless tobacco: Never  Vaping Use  Vaping status: Never Used  Substance and Sexual Activity  Alcohol use: Yes  Alcohol/week: 1.0 standard drink of alcohol  Types: 1 Glasses of wine per week  Drug use: No  Sexual activity: Defer  Objective:   Vitals:  04/23/23 0941  Weight: 68.5 kg (151 lb)  PainSc: 0-No pain  PainLoc: Breast   Body mass index is 26.75 kg/m.  Physical Exam Vitals reviewed.  Constitutional:  Appearance: Normal appearance.  Chest:  Breasts: Right: No inverted nipple, mass or nipple discharge.  Left: No inverted nipple, mass  or nipple discharge.  Lymphadenopathy:  Upper Body:  Right upper body: No supraclavicular or axillary adenopathy.  Left upper body: No supraclavicular or axillary adenopathy.  Neurological:  Mental Status: She is alert.   Assessment and Plan:   Left breast seed guided excisional biopsy  Discussed due to concern and history of breast cancer only way to prove this is fine is excision and that is her desire. Discussed seed guided excisional biopsy with her and risks, recovery

## 2023-05-07 NOTE — Discharge Instructions (Addendum)
[facility] Office Phone Number [phone]  POST OP INSTRUCTIONS Take 400 mg of ibuprofen every 8 hours or 650 mg tylenol every 6 hours for next 72 hours then as needed. Use ice several times daily also.  A prescription for pain medication may be given to you upon discharge.  Take your pain medication as prescribed, if needed.  If narcotic pain medicine is not needed, then you may take acetaminophen (Tylenol), naprosyn (Alleve) or ibuprofen (Advil) as needed. Take your usually prescribed medications unless otherwise directed If you need a refill on your pain medication, please contact your pharmacy.  They will contact our office to request authorization.  Prescriptions will not be filled after 5pm or on week-ends. You should eat very light the first 24 hours after surgery, such as soup, crackers, pudding, etc.  Resume your normal diet the day after surgery. Most patients will experience some swelling and bruising in the breast.  Ice packs and a good support bra will help.  Wear the breast binder provided or a sports bra for 72 hours day and night.  After that wear a sports bra during the day until you return to the office. Swelling and bruising can take several days to resolve.  It is common to experience some constipation if taking pain medication after surgery.  Increasing fluid intake and taking a stool softener will usually help or prevent this problem from occurring.  A mild laxative (Milk of Magnesia or Miralax) should be taken according to package directions if there are no bowel movements after 48 hours. I used skin glue on the incision, you may shower in 24 hours.  The glue will flake off over the next 2-3 weeks.  Any sutures or staples will be removed at the office during your follow-up visit. ACTIVITIES:  You may resume regular daily activities (gradually increasing) beginning the next day.  Wearing a good support bra or sports bra minimizes pain and swelling.  You may have  sexual intercourse when it is comfortable. You may drive when you no longer are taking prescription pain medication, you can comfortably wear a seatbelt, and you can safely maneuver your car and apply brakes. RETURN TO WORK:  ______________________________________________________________________________________ Bonita Quin should see your doctor in the office for a follow-up appointment approximately two weeks after your surgery.  Your doctor's nurse will typically make your follow-up appointment when she calls you with your pathology report.  Expect your pathology report 3-4 business days after your surgery.  You may call to check if you do not hear from [place] after three days. OTHER INSTRUCTIONS: _______________________________________________________________________________________________ _____________________________________________________________________________________________________________________________________ _____________________________________________________________________________________________________________________________________ _____________________________________________________________________________________________________________________________________  WHEN TO CALL DR WAKEFIELD: Fever over 101.0 Nausea and/or vomiting. Extreme swelling or bruising. Continued bleeding from incision. Increased pain, redness, or drainage from the incision.  The clinic staff is available to answer your questions during regular business hours.  Please don't hesitate to call and ask to speak to one of the nurses for clinical concerns.  If you have a medical emergency, go to the nearest emergency room or call 911.  A surgeon from [facility] is always on call at the hospital.  For further questions, please visit [email or web address]  No Tylenol until after 5pm.  You had 5mg  of Oxycodone at 2:28pm.

## 2023-05-07 NOTE — Op Note (Signed)
Preoperative diagnosis: History of left breast cancer, mammographic distortion with negative core biopsy Postoperative diagnosis: Same as above Procedure: Left breast radioactive seed guided excisional biopsy Surgeon: Dr. Harden Mo Estimated blood loss: Minimal Specimens: Left breast tissue containing seed and 2 clips Disposition recovery stable condition Complications: None Special count was correct at completion  Indication: A 57 year old female who I took care of in 2022 for a left breast cancer.  She underwent lumpectomy and sentinel node biopsy.  She had radiotherapy and has not done an antiestrogen.  She has had a mammogram that is showing a persistent distortion in that breast.  MRI does not show anything.  Core biopsy has been benign but she would like to have this removed.  We discussed the seed guided excisional biopsy.  Procedure: After informed consent was obtained she was taken to the operating.  She was given antibiotics.  SCDs were placed.  She was placed under general anesthesia without complication.  She was prepped and draped in standard sterile surgical fashion.  Surgical timeout was then performed.  Infiltrated Marcaine in the upper outer quadrant.  I then made a periareolar incision in order the scar later.  I tunneled to the seed.  I then remove the seed and some of the surrounding tissue.  Mammogram confirmed removal of 2 clips in the seed.  Hemostasis was obtained.  I closed down the tissue with 2-0 Vicryl.  The skin was closed with 3-0 Vicryl and 5-0 Monocryl.  Glue was placed.  She tolerated this well was transferred recovery stable.

## 2023-05-07 NOTE — Anesthesia Preprocedure Evaluation (Addendum)
Anesthesia Evaluation  Patient identified by MRN, date of birth, ID band Patient awake    Reviewed: Allergy & Precautions, NPO status , Patient's Chart, lab work & pertinent test results  Airway Mallampati: I  TM Distance: >3 FB Neck ROM: Full    Dental  (+) Teeth Intact, Dental Advisory Given   Pulmonary neg pulmonary ROS   Pulmonary exam normal breath sounds clear to auscultation       Cardiovascular negative cardio ROS Normal cardiovascular exam Rhythm:Regular Rate:Normal     Neuro/Psych  PSYCHIATRIC DISORDERS Anxiety     negative neurological ROS     GI/Hepatic Neg liver ROS,GERD  Controlled,,  Endo/Other  diabetes, Well Controlled, Type 2, Oral Hypoglycemic Agents    Renal/GU negative Renal ROS  negative genitourinary   Musculoskeletal  (+) Arthritis , Osteoarthritis,    Abdominal   Peds  Hematology negative hematology ROS (+)   Anesthesia Other Findings   Reproductive/Obstetrics negative OB ROS                              Anesthesia Physical Anesthesia Plan  ASA: 2  Anesthesia Plan: General   Post-op Pain Management: Tylenol PO (pre-op)*   Induction: Intravenous  PONV Risk Score and Plan: 3 and Ondansetron, Dexamethasone, Midazolam and Treatment may vary due to age or medical condition  Airway Management Planned: LMA  Additional Equipment: None  Intra-op Plan:   Post-operative Plan: Extubation in OR  Informed Consent: I have reviewed the patients History and Physical, chart, labs and discussed the procedure including the risks, benefits and alternatives for the proposed anesthesia with the patient or authorized representative who has indicated his/her understanding and acceptance.     Dental advisory given  Plan Discussed with: CRNA  Anesthesia Plan Comments:         Anesthesia Quick Evaluation

## 2023-05-07 NOTE — Anesthesia Procedure Notes (Signed)
Procedure Name: LMA Insertion Date/Time: 05/07/2023 12:34 PM  Performed by: Burna Cash, CRNAPre-anesthesia Checklist: Patient identified, Emergency Drugs available, Suction available and Patient being monitored Patient Re-evaluated:Patient Re-evaluated prior to induction Oxygen Delivery Method: Circle system utilized Preoxygenation: Pre-oxygenation with 100% oxygen Induction Type: IV induction Ventilation: Mask ventilation without difficulty LMA: LMA inserted LMA Size: 4.0 Number of attempts: 1 Airway Equipment and Method: Bite block Placement Confirmation: positive ETCO2 Tube secured with: Tape Dental Injury: Teeth and Oropharynx as per pre-operative assessment

## 2023-05-07 NOTE — Interval H&P Note (Signed)
History and Physical Interval Note:  05/07/2023 12:04 PM  Amy Stephenson  has presented today for surgery, with the diagnosis of LEFT BREAST DISTORTION.  The various methods of treatment have been discussed with the patient and family. After consideration of risks, benefits and other options for treatment, the patient has consented to  Procedure(s) with comments: LEFT BREAST SEED GUIDED EXCISIONAL BIOPSY (Left) - LMA as a surgical intervention.  The patient's history has been reviewed, patient examined, no change in status, stable for surgery.  I have reviewed the patient's chart and labs.  Questions were answered to the patient's satisfaction.     [name]

## 2023-05-08 ENCOUNTER — Encounter (HOSPITAL_BASED_OUTPATIENT_CLINIC_OR_DEPARTMENT_OTHER): Payer: Self-pay | Admitting: General Surgery

## 2023-05-08 NOTE — Anesthesia Postprocedure Evaluation (Signed)
Anesthesia Post Note  Patient: Amy Stephenson  Procedure(s) Performed: LEFT BREAST SEED GUIDED EXCISIONAL BIOPSY (Left: Breast)     Patient location during evaluation: PACU Anesthesia Type: General Level of consciousness: awake and alert, oriented and patient cooperative Pain management: pain level controlled Vital Signs Assessment: post-procedure vital signs reviewed and stable Respiratory status: spontaneous breathing, nonlabored ventilation and respiratory function stable Cardiovascular status: blood pressure returned to baseline and stable Postop Assessment: no apparent nausea or vomiting Anesthetic complications: no   No notable events documented.  Last Vitals:  Vitals:   05/07/23 1345 05/07/23 1414  BP: 123/79 130/78  Pulse: 66 63  Resp: 13 18  Temp:  36.4 C  SpO2: 100% 98%    Last Pain:  Vitals:   05/07/23 1414  TempSrc:   PainSc: 3                  Lannie Fields

## 2023-05-09 LAB — SURGICAL PATHOLOGY

## 2023-05-14 ENCOUNTER — Encounter: Payer: Self-pay | Admitting: Adult Health

## 2023-06-04 ENCOUNTER — Telehealth: Payer: Self-pay

## 2023-06-04 ENCOUNTER — Ambulatory Visit: Payer: BC Managed Care – PPO | Admitting: Adult Health

## 2023-06-04 NOTE — Telephone Encounter (Signed)
Pt called and is asking about signatera testing interval. She is ordered for every 6 month and had severa questions about changing to [employer]. Offered pt telelphone visit with MD to further discuss and she declined, stating she will come in for March signatera and decide if she would like to switch after that.

## 2023-07-18 ENCOUNTER — Other Ambulatory Visit: Payer: Self-pay | Admitting: Hematology and Oncology

## 2023-07-18 DIAGNOSIS — Z9889 Other specified postprocedural states: Secondary | ICD-10-CM

## 2023-08-29 ENCOUNTER — Ambulatory Visit
Admission: RE | Admit: 2023-08-29 | Discharge: 2023-08-29 | Disposition: A | Discharge status: Home / Self Care | Payer: BC Managed Care – PPO | Source: Ambulatory Visit | Attending: Hematology and Oncology | Admitting: Hematology and Oncology

## 2023-08-29 ENCOUNTER — Other Ambulatory Visit: Payer: Self-pay | Admitting: Hematology and Oncology

## 2023-08-29 DIAGNOSIS — Z9889 Other specified postprocedural states: Secondary | ICD-10-CM

## 2023-09-07 ENCOUNTER — Telehealth: Payer: Self-pay

## 2023-09-07 NOTE — Telephone Encounter (Signed)
Called pt per MD to advise [employer] testing was negative/not detected. Pt verbalized understanding of results and knows [facility] will be in touch to schedule 3 mo repeat lab.   

## 2023-09-10 ENCOUNTER — Encounter: Payer: Self-pay | Admitting: Hematology and Oncology

## 2024-06-05 ENCOUNTER — Other Ambulatory Visit: Payer: Self-pay | Admitting: *Deleted

## 2024-06-05 ENCOUNTER — Encounter: Payer: Self-pay | Admitting: *Deleted

## 2024-06-05 DIAGNOSIS — Z170 Estrogen receptor positive status [ER+]: Secondary | ICD-10-CM

## 2024-06-05 NOTE — Progress Notes (Signed)
 Received call from pt with complaint of ongoing left hip pain.  Pt states pain is intermittent and at times is alleviated with OTC Aleve or a heating pad. Pt states she would like to schedule a f/u with MD to further discuss. Appt scheduled, pt notified and verbalized understanding.

## 2024-07-11 ENCOUNTER — Telehealth: Payer: Self-pay

## 2024-07-11 NOTE — Telephone Encounter (Signed)
 Pt called w/ request for Gudena MD to order Ca lab to be done at her endocrinologist that she'll be seeing next week because she says that it is cheaper to have it done through them. Per chart, pt hasn't been seen by Gudena MD since 2023 and is not on any current treatment. Per MD RN advised pt to request that lab be ordered through her PCP or her endocrinologist. Pt verbalized understanding.

## 2024-07-15 NOTE — Assessment & Plan Note (Signed)
 09/10/2020: Screening mammogram detected left breast mass later palpable, 1.2 cm at 5 o'clock position, axilla negative, biopsy: Grade 1-2 IDC, ER greater than 95%, PR 90%, Ki-67 5%, HER-2 negative ratio 1.36   09/16/2020: Breast MRI: 0.6 cm mass UOQ, non-mass enhancement LOQ spanning 2.1 cm, MRI guided biopsy is recommended for the indeterminate mass in the non-mass enhancement. 10/15/20: Left Lumpectomy: 1.9 cm Grade 2 IDC , Margins Neg, ER greater than 95%, PR 90%, Ki-67 5%, HER-2 negative ratio 1.36   Oncotype Dx: Score 16, ROR 4% Adj XRT 11/18/20- 12/15/20   Bone density: -2.7: Osteoporosis   Surveillance:  1.  Breast exam 07/16/2024 benign 2. mammogram 08/29/2023: Benign breast density category C 3. Breast MRI: 04/27/2021: Benign breast density category C  breast MRI every 2 to 3 years as needed. 4.  Signatera lab test for minimal residual disease: Negative. 5.  PET CT scan 08/06/2021: No metastatic disease   Diffuse pains: Especially in the left hip radiating down the leg, pain in the chest pain in the back pain in the hips.  Markedly improved over the past year.   Patient made a decision to not receive antiestrogen therapy because of low risk Oncotype score as well as her concern for side effects.   I encouraged her to take 5000 international units of B12 sublingually daily.   Return to clinic in 1 year for follow-up

## 2024-07-16 ENCOUNTER — Inpatient Hospital Stay: Attending: Hematology and Oncology | Admitting: Hematology and Oncology

## 2024-07-16 VITALS — BP 118/74 | HR 86 | Temp 98.3°F | Resp 18 | Ht 64.0 in | Wt 151.8 lb

## 2024-07-16 DIAGNOSIS — Z170 Estrogen receptor positive status [ER+]: Secondary | ICD-10-CM

## 2024-07-16 DIAGNOSIS — C50512 Malignant neoplasm of lower-outer quadrant of left female breast: Secondary | ICD-10-CM | POA: Diagnosis not present

## 2024-07-16 DIAGNOSIS — M25552 Pain in left hip: Secondary | ICD-10-CM

## 2024-07-16 MED ORDER — AZITHROMYCIN 250 MG PO TABS: ORAL_TABLET | ORAL | 0 refills | Status: DC

## 2024-07-16 NOTE — Progress Notes (Signed)
 "  Patient Care Team: Rox Charleston, MD as PCP - General (Obstetrics and Gynecology) Odean Potts, MD as Consulting Physician (Hematology and Oncology) Izell Domino, MD as Attending Physician (Radiation Oncology) Ebbie Cough, MD as Consulting Physician (General Surgery)  DIAGNOSIS:  Encounter Diagnosis  Name Primary?   Malignant neoplasm of lower-outer quadrant of left breast of female, estrogen receptor positive (HCC) Yes    SUMMARY OF ONCOLOGIC HISTORY: Oncology History  Malignant neoplasm of lower-outer quadrant of left breast of female, estrogen receptor positive (HCC)  09/10/2020 Initial Diagnosis   Screening mammogram showed a possible left breast mass, palpable on exam. Diagnostic mammogram and US  showed a 1.2cm mass at the 5 o'clock position and no abnormal axillary lymph nodes. Biopsy showed invasive mammary carcinoma, grade 1-2, HER-2 equivocal by IHC (2+), negative by FISH (ratio 1.36), ER+>95%, PR+ 90%, Ki67 5%. B   09/10/2020 Cancer Staging   Staging form: Breast, AJCC 8th Edition - Clinical stage from 09/10/2020: Stage IB (cT2, cN0, cM0, G2, ER+, PR+, HER2-) - Signed by Crawford Morna Pickle, NP on 09/22/2020 Stage prefix: Initial diagnosis Histologic grading system: 3 grade system   09/16/2020 Breast MRI   0.6cm mass in the upper outer quadrant, non-mass enhancement in the low outer quadrant, and the known malignancy spanning 2.1cm in the left breast with no evidence of right breast malignancy.    Genetic Testing   Negative genetic testing. No pathogenic variants identified on the [facility] STAT+Multi-Cancer Panel. The report date is 10/04/2020.  The STAT Breast cancer panel offered by [facility] includes sequencing and rearrangement analysis for the following 9 genes:  ATM, BRCA1, BRCA2, CDH1, CHEK2, PALB2, PTEN, STK11 and TP53.    The Multi-Cancer Panel + RNA offered by [facility] includes sequencing and/or deletion duplication testing of the following 84 genes: AIP,  ALK, APC, ATM, AXIN2,BAP1,  BARD1, BLM, BMPR1A, BRCA1, BRCA2, BRIP1, CASR, CDC73, CDH1, CDK4, CDKN1B, CDKN1C, CDKN2A (p14ARF), CDKN2A (p16INK4a), CEBPA, CHEK2, CTNNA1, DICER1, DIS3L2, EGFR (c.2369C>T, p.Thr790Met variant only), EPCAM (Deletion/duplication testing only), FH, FLCN, GATA2, GPC3, GREM1 (Promoter region deletion/duplication testing only), HOXB13 (c.251G>A, p.Gly84Glu), HRAS, KIT, MAX, MEN1, MET, MITF (c.952G>A, p.Glu318Lys variant only), MLH1, MSH2, MSH3, MSH6, MUTYH, NBN, NF1, NF2, NTHL1, PALB2, PDGFRA, PHOX2B, PMS2, POLD1, POLE, POT1, PRKAR1A, PTCH1, PTEN, RAD50, RAD51C, RAD51D, RB1, RECQL4, RET, RUNX1, SDHAF2, SDHA (sequence changes only), SDHB, SDHC, SDHD, SMAD4, SMARCA4, SMARCB1, SMARCE1, STK11, SUFU, TERC, TERT, TMEM127, TP53, TSC1, TSC2, VHL, WRN and WT1.   10/15/2020 Surgery   Left breast lumpectomy: IDC, grade 2, margins neg, 1 SLN neg; T1c, N0, M0, g2, ER+/PR+/HER-2-   10/15/2020 Oncotype testing   Score 16, ROR 4%   11/04/2020 Cancer Staging   Staging form: Breast, AJCC 8th Edition - Pathologic: Stage IA (pT1c, pN0, cM0, G2, ER+, PR+, HER2-) - Signed by Odean Potts, MD on 11/04/2020 Stage prefix: Initial diagnosis Histologic grading system: 3 grade system   11/17/2020 - 12/15/2020 Radiation Therapy   Adjuvant radiation therapy Site Technique Total Dose (Gy) Dose per Fx (Gy) Completed Fx Beam Energies  Breast, Left: Breast_Lt 3D 40.05/40.05 2.67 15/15 6XFFF  Breast, Left: Breast_Lt_Bst 3D 10/10 2 5/5 6X     03/17/2021 -  Anti-estrogen oral therapy   Declined due to extensive musculoskeletal aches and neuropathic pains     CHIEF COMPLIANT: Surveillance of breast cancer complaining of severe pain in the left hip and neck and back  HISTORY OF PRESENT ILLNESS:   History of Present Illness Amy Stephenson is a 59 year old female with stage I  ER-positive left breast cancer, status post lumpectomy and radiation, who presents for evaluation of persistent left-sided pain and  concern for recurrence.  She is 4 years post lumpectomy and adjuvant radiation for stage I ER-positive left breast cancer. She performs self-exams of the prior surgery and lymph node dissection areas and occasionally feels sensations near the lymph node incision. She has chronic diffuse pain involving the entire left side but denies new palpable masses or lumps.  She has chronic deep burning pain in the left hip and leg consistent with longstanding sciatica. It is worsened by certain positions, including sitting with the leg elevated, is often present for months at a time, and causes nocturnal discomfort and difficulty sleeping. She does regular sciatica exercises, stays hydrated, and walks about a mile daily. Turmeric supplementation did not help. She recalls a prior bone scan with widespread increased uptake attributed to arthritis, which increased her anxiety, followed by an MRI.  Over the past year she has had significant emotional distress related to caregiving responsibilities, insurance problems, and family events. She feels overwhelmed, tearful, and worn down but does not identify as depressed. She is taking a lower dose of Lexapro , which she feels helps her mood, and is trying to maintain a positive outlook and healthy lifestyle, though chronic pain and psychosocial stress are affecting her overall well-being.     ALLERGIES:  is allergic to penicillins, augmentin [amoxicillin-pot clavulanate], and sulfa antibiotics.  MEDICATIONS:  Current Outpatient Medications  Medication Sig Dispense Refill   cetirizine (ZYRTEC) 10 MG chewable tablet Chew 10 mg by mouth daily.     escitalopram  (LEXAPRO ) 10 MG tablet Take 1 tablet (10 mg total) by mouth daily. Needs ov. Thanks. 90 tablet 0   glipiZIDE (GLUCOTROL XL) 5 MG 24 hr tablet Take 5 mg by mouth daily with breakfast.     glucosamine-chondroitin 500-400 MG tablet Take 1 tablet by mouth 1 day or 1 dose.     magnesium (MAGTAB) 84 MG ( ) TBCR SR  tablet Take 500 mg by mouth. (Patient taking differently: Take 500 mg by mouth. Once daily)     metFORMIN (GLUCOPHAGE) 1000 MG tablet Take 1,000 mg by mouth 2 (two) times daily with a meal.     MULTIPLE VITAMIN PO Take 0.5 tablets by mouth.      Turmeric (QC TUMERIC COMPLEX) 500 MG CAPS Take by mouth. (Patient taking differently: Take by mouth. 2x daily)     zolpidem (AMBIEN) 10 MG tablet zolpidem 10 mg tablet     No current facility-administered medications for this visit.    PHYSICAL EXAMINATION: ECOG PERFORMANCE STATUS: 1 - Symptomatic but completely ambulatory  Vitals:   07/16/24 1022  BP: 118/74  Pulse: 86  Resp: 18  Temp: 98.3 F (36.8 C)  SpO2: 99%   Filed Weights   07/16/24 1022  Weight: 151 lb 12.8 oz (68.9 kg)   Breast exam: Benign tenderness of the left surgical scars  LABORATORY DATA:  I have reviewed the data as listed    Latest Ref Rng & Units 04/26/2023    9:43 AM 06/02/2022    8:52 AM 07/26/2021    9:29 AM  CMP  Glucose 70 - 99 mg/dL 891  84  812   BUN 6 - 20 mg/dL 13  24  25    Creatinine 0.44 - 1.00 mg/dL 9.34  9.43  9.29   Sodium 135 - 145 mmol/L 139  138  138   Potassium 3.5 - 5.1 mmol/L 4.6  3.8  4.0   Chloride 98 - 111 mmol/L 104  104  104   CO2 22 - 32 mmol/L 27  28  28    Calcium 8.9 - 10.3 mg/dL 9.5  9.6  9.5   Total Protein 6.5 - 8.1 g/dL  7.4  7.0   Total Bilirubin 0.3 - 1.2 mg/dL  0.3  0.5   Alkaline Phos 38 - 126 U/L  78  74   AST 15 - 41 U/L  14  14   ALT 0 - 44 U/L  13  15     Lab Results  Component Value Date   WBC 6.4 06/02/2022   HGB 13.2 06/02/2022   HCT 40.6 06/02/2022   MCV 90.2 06/02/2022   PLT 271 06/02/2022   NEUTROABS 3.5 06/02/2022    ASSESSMENT & PLAN:  Malignant neoplasm of lower-outer quadrant of left breast of female, estrogen receptor positive (HCC) 09/10/2020: Screening mammogram detected left breast mass later palpable, 1.2 cm at 5 o'clock position, axilla negative, biopsy: Grade 1-2 IDC, ER greater than 95%, PR  90%, Ki-67 5%, HER-2 negative ratio 1.36   09/16/2020: Breast MRI: 0.6 cm mass UOQ, non-mass enhancement LOQ spanning 2.1 cm, MRI guided biopsy is recommended for the indeterminate mass in the non-mass enhancement. 10/15/20: Left Lumpectomy: 1.9 cm Grade 2 IDC , Margins Neg, ER greater than 95%, PR 90%, Ki-67 5%, HER-2 negative ratio 1.36   Oncotype Dx: Score 16, ROR 4% Adj XRT 11/18/20- 12/15/20   Bone density: -2.7: Osteoporosis   Surveillance:  1.  Breast exam 07/16/2024 benign 2. mammogram 08/29/2023: Benign breast density category C 3. Breast MRI: 04/27/2021: Benign breast density category C  breast MRI every 2 to 3 years as needed. 4.  Signatera lab test for minimal residual disease: Negative. 5.  PET CT scan 08/06/2021: No metastatic disease   Diffuse pains: Especially in the left hip radiating down the leg, pain in the chest pain in the back pain in the hips.  Markedly improved over the past year.   Patient made a decision to not receive antiestrogen therapy because of low risk Oncotype score as well as her concern for side effects. Left hip pain: Will obtain MRI of the left hip Neck and back pain: Obtain CT chest abdomen pelvis Upper respiratory infection: Sent a prescription for azithromycin   Telephone visit in 3 weeks to discuss results of scans If everything is well then we will see her back in 1 year   No orders of the defined types were placed in this encounter.  The patient has a good understanding of the overall plan. she agrees with it. she will call with any problems that may develop before the next visit here.  I personally spent a total of 30 minutes in the care of the patient today including preparing to see the patient, getting/reviewing separately obtained history, performing a medically appropriate exam/evaluation, counseling and educating, placing orders, referring and communicating with other health care professionals, documenting clinical information in the EHR,  independently interpreting results, communicating results, and coordinating care.   Viinay K Roczen Waymire, MD 07/16/24    "

## 2024-07-17 ENCOUNTER — Other Ambulatory Visit: Payer: Self-pay

## 2024-07-17 ENCOUNTER — Telehealth: Payer: Self-pay

## 2024-07-17 DIAGNOSIS — M25552 Pain in left hip: Secondary | ICD-10-CM

## 2024-07-17 NOTE — Telephone Encounter (Signed)
 Pt called requesting that we modify her upcoming MRI order to reflect the purpose of evaluating for possible metastatic disease to her hip. She also requested that we change the location to [place]. Per MD RN modified the order and changed the location as requested. Pt notified and verbalized understanding.

## 2024-07-25 ENCOUNTER — Ambulatory Visit
Admission: RE | Admit: 2024-07-25 | Discharge: 2024-07-25 | Disposition: A | Source: Ambulatory Visit | Attending: Hematology and Oncology

## 2024-07-25 ENCOUNTER — Telehealth: Payer: Self-pay

## 2024-07-25 DIAGNOSIS — N2889 Other specified disorders of kidney and ureter: Secondary | ICD-10-CM

## 2024-07-25 DIAGNOSIS — C50512 Malignant neoplasm of lower-outer quadrant of left female breast: Secondary | ICD-10-CM

## 2024-07-25 MED ORDER — IOPAMIDOL (ISOVUE-300) INJECTION 61%
100.0000 mL | Freq: Once | INTRAVENOUS | Status: AC | PRN
  Administered 2024-07-25: 100 mL via INTRAVENOUS

## 2024-07-25 NOTE — Telephone Encounter (Signed)
 Pt called requesting to speak with MD as she received her CT CAP results which showed unfavorable results.   She is questioning why she was never told she had a previous area on her kidney as referred to in current CT. Pt's concerns were validated and offered apologies for this result. She was offered a phone visit with Dr Odean Monday 2/2 to further discuss. Per Dr Odean, an urgent referral placed to [facility] Dr Gretel Ferrara. Referral and all supporting documents faxed to [phone]. Fax confirmation received.

## 2024-07-28 ENCOUNTER — Ambulatory Visit (HOSPITAL_COMMUNITY)
Admission: RE | Admit: 2024-07-28 | Discharge: 2024-07-28 | Disposition: A | Discharge status: Home / Self Care | Source: Ambulatory Visit | Attending: Hematology and Oncology | Admitting: Hematology and Oncology

## 2024-07-28 ENCOUNTER — Encounter: Payer: Self-pay | Admitting: Hematology and Oncology

## 2024-07-28 ENCOUNTER — Inpatient Hospital Stay: Admitting: Hematology and Oncology

## 2024-07-28 ENCOUNTER — Other Ambulatory Visit: Payer: Self-pay | Admitting: Hematology and Oncology

## 2024-07-28 ENCOUNTER — Ambulatory Visit

## 2024-07-28 DIAGNOSIS — N2889 Other specified disorders of kidney and ureter: Secondary | ICD-10-CM

## 2024-07-28 DIAGNOSIS — Z170 Estrogen receptor positive status [ER+]: Secondary | ICD-10-CM

## 2024-07-28 DIAGNOSIS — C50512 Malignant neoplasm of lower-outer quadrant of left female breast: Secondary | ICD-10-CM | POA: Diagnosis not present

## 2024-07-28 MED ORDER — GADOBUTROL 1 MMOL/ML IV SOLN
7.0000 mL | Freq: Once | INTRAVENOUS | Status: AC | PRN
  Administered 2024-07-28: 7 mL via INTRAVENOUS

## 2024-07-28 NOTE — Progress Notes (Signed)
 Ordering MRI abdomen stat

## 2024-07-29 ENCOUNTER — Encounter: Payer: Self-pay | Admitting: Hematology and Oncology

## 2024-07-29 ENCOUNTER — Telehealth: Payer: Self-pay

## 2024-07-29 ENCOUNTER — Encounter: Payer: Self-pay | Admitting: *Deleted

## 2024-07-29 NOTE — Telephone Encounter (Signed)
 Pt called this morning and states she was called by [facility] and asked if she could go ahead and head in. Pt was agreeable and once she arrived she was turned away from seeing Dr Renda even after speaking with the office manager. Pt was upset and tearful as she was anxious for this appt all weekend given her CT results. Dr Odean reached out to Dr Renda about this and pt was made aware.

## 2024-07-29 NOTE — Progress Notes (Signed)
 Received call from pt requesting referral be sent to [facility] for evaluation of renal mass seen on recent CT.  RN successfully faxed referral to [phone].

## 2024-07-30 ENCOUNTER — Other Ambulatory Visit: Payer: Self-pay | Admitting: Urology

## 2024-07-31 ENCOUNTER — Other Ambulatory Visit: Payer: Self-pay | Admitting: Urology

## 2024-08-05 ENCOUNTER — Other Ambulatory Visit

## 2024-08-06 ENCOUNTER — Encounter (HOSPITAL_COMMUNITY)

## 2024-08-13 ENCOUNTER — Inpatient Hospital Stay: Admitting: Hematology and Oncology

## 2024-08-14 ENCOUNTER — Inpatient Hospital Stay (HOSPITAL_COMMUNITY): Admit: 2024-08-14 | Admitting: Urology

## 2025-07-16 ENCOUNTER — Inpatient Hospital Stay: Admitting: Hematology and Oncology
